# Patient Record
Sex: Female | Born: 1975 | Race: White | Hispanic: No | Marital: Single | Smoking: Current every day smoker
Health system: Southern US, Community
[De-identification: ages and names within clinical notes are randomized; demographics above are authoritative.]

## PROBLEM LIST (undated history)

## (undated) DIAGNOSIS — R011 Cardiac murmur, unspecified: Secondary | ICD-10-CM

## (undated) DIAGNOSIS — F411 Generalized anxiety disorder: Secondary | ICD-10-CM

## (undated) DIAGNOSIS — F32A Depression, unspecified: Secondary | ICD-10-CM

## (undated) DIAGNOSIS — R911 Solitary pulmonary nodule: Secondary | ICD-10-CM

## (undated) DIAGNOSIS — Z720 Tobacco use: Secondary | ICD-10-CM

## (undated) DIAGNOSIS — I10 Essential (primary) hypertension: Secondary | ICD-10-CM

## (undated) DIAGNOSIS — M797 Fibromyalgia: Secondary | ICD-10-CM

## (undated) DIAGNOSIS — T4145XA Adverse effect of unspecified anesthetic, initial encounter: Secondary | ICD-10-CM

## (undated) DIAGNOSIS — I471 Supraventricular tachycardia, unspecified: Secondary | ICD-10-CM

## (undated) DIAGNOSIS — R161 Splenomegaly, not elsewhere classified: Secondary | ICD-10-CM

## (undated) DIAGNOSIS — K760 Fatty (change of) liver, not elsewhere classified: Secondary | ICD-10-CM

## (undated) DIAGNOSIS — F329 Major depressive disorder, single episode, unspecified: Secondary | ICD-10-CM

## (undated) DIAGNOSIS — R002 Palpitations: Secondary | ICD-10-CM

## (undated) DIAGNOSIS — I7300 Raynaud's syndrome without gangrene: Secondary | ICD-10-CM

## (undated) DIAGNOSIS — J189 Pneumonia, unspecified organism: Secondary | ICD-10-CM

## (undated) DIAGNOSIS — T8859XA Other complications of anesthesia, initial encounter: Secondary | ICD-10-CM

## (undated) DIAGNOSIS — M67919 Unspecified disorder of synovium and tendon, unspecified shoulder: Secondary | ICD-10-CM

## (undated) DIAGNOSIS — M8700 Idiopathic aseptic necrosis of unspecified bone: Secondary | ICD-10-CM

## (undated) DIAGNOSIS — K219 Gastro-esophageal reflux disease without esophagitis: Secondary | ICD-10-CM

## (undated) HISTORY — DX: Essential (primary) hypertension: I10

## (undated) HISTORY — PX: TONSILLECTOMY: SUR1361

## (undated) HISTORY — DX: Palpitations: R00.2

## (undated) HISTORY — DX: Tobacco use: Z72.0

## (undated) HISTORY — DX: Generalized anxiety disorder: F41.1

## (undated) HISTORY — DX: Depression, unspecified: F32.A

## (undated) HISTORY — DX: Major depressive disorder, single episode, unspecified: F32.9

## (undated) HISTORY — DX: Supraventricular tachycardia: I47.1

## (undated) HISTORY — DX: Cardiac murmur, unspecified: R01.1

## (undated) HISTORY — DX: Supraventricular tachycardia, unspecified: I47.10

---

## 2014-12-19 HISTORY — PX: SACRO-ILIAC PINNING: SHX5050

## 2016-04-27 ENCOUNTER — Emergency Department (HOSPITAL_COMMUNITY)
Admission: EM | Admit: 2016-04-27 | Discharge: 2016-04-28 | Disposition: A | Discharge status: Home / Self Care | Payer: Self-pay | Attending: Emergency Medicine | Admitting: Emergency Medicine

## 2016-04-27 ENCOUNTER — Other Ambulatory Visit: Payer: Self-pay

## 2016-04-27 ENCOUNTER — Emergency Department (HOSPITAL_COMMUNITY): Payer: Self-pay

## 2016-04-27 ENCOUNTER — Encounter (HOSPITAL_COMMUNITY): Payer: Self-pay | Admitting: *Deleted

## 2016-04-27 DIAGNOSIS — R55 Syncope and collapse: Secondary | ICD-10-CM | POA: Insufficient documentation

## 2016-04-27 DIAGNOSIS — R0602 Shortness of breath: Secondary | ICD-10-CM | POA: Insufficient documentation

## 2016-04-27 DIAGNOSIS — R110 Nausea: Secondary | ICD-10-CM | POA: Insufficient documentation

## 2016-04-27 DIAGNOSIS — R000 Tachycardia, unspecified: Secondary | ICD-10-CM | POA: Insufficient documentation

## 2016-04-27 DIAGNOSIS — R079 Chest pain, unspecified: Secondary | ICD-10-CM | POA: Insufficient documentation

## 2016-04-27 DIAGNOSIS — R945 Abnormal results of liver function studies: Secondary | ICD-10-CM | POA: Insufficient documentation

## 2016-04-27 DIAGNOSIS — R7989 Other specified abnormal findings of blood chemistry: Secondary | ICD-10-CM

## 2016-04-27 HISTORY — DX: Gastro-esophageal reflux disease without esophagitis: K21.9

## 2016-04-27 LAB — BASIC METABOLIC PANEL
ANION GAP: 16 — AB (ref 5–15)
BUN: <5 mg/dL — ABNORMAL LOW (ref 6–20)
CALCIUM: 8.9 mg/dL (ref 8.9–10.3)
CO2: 21 mmol/L — ABNORMAL LOW (ref 22–32)
Chloride: 98 mmol/L — ABNORMAL LOW (ref 101–111)
Creatinine, Ser: 0.78 mg/dL (ref 0.44–1.00)
GLUCOSE: 96 mg/dL (ref 65–99)
POTASSIUM: 3.7 mmol/L (ref 3.5–5.1)
Sodium: 135 mmol/L (ref 135–145)

## 2016-04-27 LAB — HEPATIC FUNCTION PANEL
ALBUMIN: 4 g/dL (ref 3.5–5.0)
ALT: 89 U/L — ABNORMAL HIGH (ref 14–54)
AST: 76 U/L — AB (ref 15–41)
Alkaline Phosphatase: 73 U/L (ref 38–126)
Bilirubin, Direct: 0.1 mg/dL (ref 0.1–0.5)
Indirect Bilirubin: 0.5 mg/dL (ref 0.3–0.9)
TOTAL PROTEIN: 7.5 g/dL (ref 6.5–8.1)
Total Bilirubin: 0.6 mg/dL (ref 0.3–1.2)

## 2016-04-27 LAB — CBC WITH DIFFERENTIAL/PLATELET
BASOS ABS: 0.1 10*3/uL (ref 0.0–0.1)
BASOS PCT: 1 %
EOS ABS: 0.1 10*3/uL (ref 0.0–0.7)
EOS PCT: 1 %
HCT: 43.2 % (ref 36.0–46.0)
HEMOGLOBIN: 14.4 g/dL (ref 12.0–15.0)
LYMPHS ABS: 1.9 10*3/uL (ref 0.7–4.0)
Lymphocytes Relative: 12 %
MCH: 30.3 pg (ref 26.0–34.0)
MCHC: 33.3 g/dL (ref 30.0–36.0)
MCV: 90.8 fL (ref 78.0–100.0)
Monocytes Absolute: 0.9 10*3/uL (ref 0.1–1.0)
Monocytes Relative: 6 %
NEUTROS PCT: 80 %
Neutro Abs: 12.2 10*3/uL — ABNORMAL HIGH (ref 1.7–7.7)
PLATELETS: 394 10*3/uL (ref 150–400)
RBC: 4.76 MIL/uL (ref 3.87–5.11)
RDW: 12.5 % (ref 11.5–15.5)
WBC: 15.2 10*3/uL — AB (ref 4.0–10.5)

## 2016-04-27 LAB — D-DIMER, QUANTITATIVE (NOT AT ARMC): D DIMER QUANT: 0.93 ug{FEU}/mL — AB (ref 0.00–0.50)

## 2016-04-27 LAB — TROPONIN I

## 2016-04-27 LAB — LIPASE, BLOOD: LIPASE: 21 U/L (ref 11–51)

## 2016-04-27 MED ORDER — ASPIRIN 81 MG PO CHEW
324.0000 mg | CHEWABLE_TABLET | Freq: Once | ORAL | Status: DC
  Filled 2016-04-27 (×2): qty 4

## 2016-04-27 MED ORDER — SODIUM CHLORIDE 0.9 % IV BOLUS (SEPSIS)
1000.0000 mL | Freq: Once | INTRAVENOUS | Status: AC
  Administered 2016-04-27: 1000 mL via INTRAVENOUS

## 2016-04-27 MED ORDER — ONDANSETRON HCL 4 MG/2ML IJ SOLN
4.0000 mg | Freq: Once | INTRAMUSCULAR | Status: AC
Start: 2016-04-27 — End: 2016-04-27
  Administered 2016-04-27: 4 mg via INTRAVENOUS
  Filled 2016-04-27: qty 2

## 2016-04-27 MED ORDER — ONDANSETRON HCL 4 MG PO TABS: 4.0000 mg | ORAL_TABLET | Freq: Three times a day (TID) | ORAL | Status: DC | PRN

## 2016-04-27 MED ORDER — IOPAMIDOL (ISOVUE-370) INJECTION 76%
INTRAVENOUS | Status: AC
  Administered 2016-04-27: 100 mL
  Filled 2016-04-27: qty 100

## 2016-04-27 MED ORDER — SODIUM CHLORIDE 0.9 % IV SOLN
INTRAVENOUS | Status: DC
  Administered 2016-04-27: 20:00:00 via INTRAVENOUS

## 2016-04-27 NOTE — ED Provider Notes (Signed)
CSN: [id number]     Arrival date & time 04/27/16  1719 History   First MD Initiated Contact with Patient 04/27/16 1720     Chief Complaint  Patient presents with  . Chest Pain  . Shortness of Breath     (Consider location/radiation/quality/duration/timing/severity/associated sxs/prior Treatment) Patient is a 40 y.o. female presenting with general illness. The history is provided by the patient.  Illness Location:  Chest pain Severity:  Moderate Onset quality:  Unable to specify Duration:  2 weeks Timing:  Intermittent Progression:  Waxing and waning Chronicity:  New Context:  No pertinent past medical history. 2 weeks of waxing and waning right-sided chest pain. Today got worse. More left-sided, sharp pain, radiates to substernal area. Endorses nausea, no emesis. No diaphoresis. Endorses shortness of breath. Endorses near syncope. No cardiac risk factors, no PE risk factors. Associated symptoms: chest pain, nausea and shortness of breath   Associated symptoms: no abdominal pain, no cough, no diarrhea, no fever, no headaches and no vomiting     No past medical history on file. No past surgical history on file. No family history on file. Social History  Substance Use Topics  . Smoking status: Not on file  . Smokeless tobacco: Not on file  . Alcohol Use: Not on file   OB History    No data available     Review of Systems  Constitutional: Negative for fever and chills.  Respiratory: Positive for shortness of breath. Negative for cough.   Cardiovascular: Positive for chest pain. Negative for palpitations and leg swelling.  Gastrointestinal: Positive for nausea. Negative for vomiting, abdominal pain and diarrhea.  Musculoskeletal: Negative for back pain and neck pain.  Neurological: Positive for light-headedness. Negative for headaches.  All other systems reviewed and are negative.     Allergies  Review of patient's allergies indicates not on file.  Home Medications    Prior to Admission medications   Not on File   There were no vitals taken for this visit. Physical Exam  Constitutional: She is oriented to person, place, and time. She appears well-developed and well-nourished. No distress.  HENT:  Head: Normocephalic and atraumatic.  Eyes: EOM are normal. Pupils are equal, round, and reactive to light.  Neck: Normal range of motion.  Cardiovascular: Normal rate and regular rhythm.   Pulmonary/Chest: No tachypnea. No respiratory distress.  Abdominal: Soft. Normal appearance. There is no tenderness.  Neurological: She is alert and oriented to person, place, and time. GCS eye subscore is 4. GCS verbal subscore is 5. GCS motor subscore is 6.  Skin: Skin is warm and dry.    ED Course  Procedures (including critical care time) Labs Review Labs Reviewed  BASIC METABOLIC PANEL - Abnormal; Notable for the following:    Chloride 98 (*)    CO2 21 (*)    BUN <5 (*)    Anion gap 16 (*)    All other components within normal limits  CBC WITH DIFFERENTIAL/PLATELET - Abnormal; Notable for the following:    WBC 15.2 (*)    Neutro Abs 12.2 (*)    All other components within normal limits  D-DIMER, QUANTITATIVE (NOT AT [facility]) - Abnormal; Notable for the following:    D-Dimer, Quant 0.93 (*)    All other components within normal limits  HEPATIC FUNCTION PANEL - Abnormal; Notable for the following:    AST 76 (*)    ALT 89 (*)    All other components within normal limits  TROPONIN I  TROPONIN I  LIPASE, BLOOD  HEPATITIS PANEL, ACUTE  POC URINE PREG, ED    Imaging Review Dg Chest 2 View  04/27/2016  CLINICAL DATA:  Left-sided pain for 2 weeks. Loss of consciousness today. Initial encounter. EXAM: CHEST  2 VIEW COMPARISON:  None. FINDINGS: The lungs are clear. Heart size is normal. No pneumothorax or pleural effusion. No bony abnormality. IMPRESSION: Normal chest. Electronically Signed   By: Inge Rise M.D.   On: 04/27/2016 18:19   Ct Angio Chest  Pe W/cm &/or Wo Cm  04/27/2016  CLINICAL DATA:  Chest pain for several days. Shortness of breath and chest palpitations. Initial encounter. EXAM: CT ANGIOGRAPHY CHEST WITH CONTRAST TECHNIQUE: Multidetector CT imaging of the chest was performed using the standard protocol during bolus administration of intravenous contrast. Multiplanar CT image reconstructions and MIPs were obtained to evaluate the vascular anatomy. CONTRAST:  100 ml Isovue 370. COMPARISON:  PA and lateral chest earlier today. FINDINGS: No pulmonary embolus is identified. No pleural or pericardial effusion. No axillary, hilar or mediastinal lymphadenopathy. Heart size is normal. The lungs are clear. Incidentally imaged upper abdomen shows fatty infiltration of the liver. No focal bony abnormality. Review of the MIP images confirms the above findings. IMPRESSION: Negative for pulmonary embolus or acute cardiopulmonary disease. Fatty infiltration of the liver. Electronically Signed   By: Inge Rise M.D.   On: 04/27/2016 20:04   I have personally reviewed and evaluated these images and lab results as part of my medical decision-making.   EKG Interpretation   Date/Time:  Wednesday Apr 27 2016 17:19:16 EDT Ventricular Rate:  112 PR Interval:  136 QRS Duration: 78 QT Interval:  344 QTC Calculation: 469 R Axis:   81 Text Interpretation:  Sinus tachycardia Otherwise normal ECG No old  tracing to compare Confirmed by [facility]  MD, DAVID ([id number]) on 04/27/2016  5:22:23 PM      MDM   Final diagnoses:  Chest pain  Elevated LFTs    40 year old female with no pertinent past medical history presenting with chest pain.  Presently the last 2 weeks. Worse today. Came to the emergency department due to near syncopal episode and worsening pain. Pleuritic in nature. Reports pain is improved by the time she got here. Denies any fevers and chills. Denies localizing infectious symptoms.  Patient has sinus tachycardia and mild tachypnea. No  hypertension. No hypoxia. Afebrile. Cardiopulmonary exam normal. Pain is reproducible on physical exam.  EKG without interval abnormalities or ischemic changes. Giving the patient a full dose aspirin. Getting delta troponin. Patient is a wells score 1.5. Getting a d-dimer. Getting a chest x-ray.  7:22 PM D-dimer elevated. Getting a CT PE study. Leukocytosis. Mild anion gap. Not hyperglycemic. First troponin negative.  7:53 PM Called into the patient's room as she was vomiting. She did just recently get her IV contrast for her PE study. Patient reports that she's had IV contrast in the past and has not had an issue with her. No rashes identified. No wheezing on exam. We'll give her Zofran here. We'll continue to monitor for potential allergic reaction. We will add on lipase and hepatic function panel.  8:10 PM CT PE study without evidence of pulmonary embolus him. No evidence of pneumothorax or pneumonia. Still waiting on a second troponin and lipase and hepatic function panel. We'll continue to give IV fluids and antiemetics as needed.  9:45 PM Lipase without evidence of pancreatitis. Patient has mild elevation in her LFTs. She reports that she does  have a history of being exposed to somebody with hepatitis C in the past. Denies any IV or illicit drug use. Denies heavy Tylenol use. Denies heavy alcohol use. We'll send off a hepatitis panel.  11:40 PM Second troponin returned. Doubt ACS. Patient had a prolonged stay as her labs were lost multiple times in the lab.  Strict return precautions provided. Provided her information on how to establish care with a primary care doctor. Encouraged her to do this. Gave her prescription for Zofran given her nausea here. Information regarding interactions and side effects of medication were provided.  Patient discharged in stable condition.  Maryan Puls, MD [phone] [id number]  Delora Fuel, MD [phone]

## 2016-04-27 NOTE — ED Notes (Signed)
Pt arrived by [facility] for cp and sob x 2 weeks. Increase in symptoms today including dizziness. Sob increases with exertion. Pain radiates from left chest to right chest. Received nitro x 2 and 324mg  asa pta.

## 2016-04-27 NOTE — ED Notes (Signed)
Patient transported to X-ray 

## 2016-04-28 NOTE — ED Notes (Signed)
Discharge instructions reviewed - voiced understanding 

## 2016-04-29 LAB — HEPATITIS PANEL, ACUTE
HCV Ab: <0.1 s/co ratio (ref 0.0–0.9)
HEP B C IGM: NEGATIVE
Hep A IgM: NEGATIVE
Hepatitis B Surface Ag: NEGATIVE

## 2016-11-18 ENCOUNTER — Emergency Department (HOSPITAL_COMMUNITY): Payer: Self-pay

## 2016-11-18 ENCOUNTER — Emergency Department (HOSPITAL_COMMUNITY)
Admission: EM | Admit: 2016-11-18 | Discharge: 2016-11-18 | Disposition: A | Discharge status: Home / Self Care | Payer: Self-pay | Attending: Emergency Medicine | Admitting: Emergency Medicine

## 2016-11-18 DIAGNOSIS — E876 Hypokalemia: Secondary | ICD-10-CM | POA: Insufficient documentation

## 2016-11-18 DIAGNOSIS — Z79899 Other long term (current) drug therapy: Secondary | ICD-10-CM | POA: Insufficient documentation

## 2016-11-18 DIAGNOSIS — R079 Chest pain, unspecified: Secondary | ICD-10-CM | POA: Insufficient documentation

## 2016-11-18 LAB — BASIC METABOLIC PANEL
ANION GAP: 11 (ref 5–15)
BUN: 9 mg/dL (ref 6–20)
CHLORIDE: 101 mmol/L (ref 101–111)
CO2: 25 mmol/L (ref 22–32)
Calcium: 9.1 mg/dL (ref 8.9–10.3)
Creatinine, Ser: 0.62 mg/dL (ref 0.44–1.00)
GFR calc Af Amer: >60 mL/min (ref >60)
GLUCOSE: 106 mg/dL — AB (ref 65–99)
POTASSIUM: 3 mmol/L — AB (ref 3.5–5.1)
Sodium: 137 mmol/L (ref 135–145)

## 2016-11-18 LAB — CBC
HEMATOCRIT: 40.9 % (ref 36.0–46.0)
HEMOGLOBIN: 14.1 g/dL (ref 12.0–15.0)
MCH: 32 pg (ref 26.0–34.0)
MCHC: 34.5 g/dL (ref 30.0–36.0)
MCV: 93 fL (ref 78.0–100.0)
Platelets: 333 10*3/uL (ref 150–400)
RBC: 4.4 MIL/uL (ref 3.87–5.11)
RDW: 12.4 % (ref 11.5–15.5)
WBC: 10.2 10*3/uL (ref 4.0–10.5)

## 2016-11-18 LAB — I-STAT TROPONIN, ED: Troponin i, poc: 0 ng/mL (ref 0.00–0.08)

## 2016-11-18 LAB — TSH: TSH: 5.319 u[IU]/mL — AB (ref 0.350–4.500)

## 2016-11-18 MED ORDER — POTASSIUM CHLORIDE CRYS ER 20 MEQ PO TBCR
40.0000 meq | EXTENDED_RELEASE_TABLET | Freq: Once | ORAL | Status: AC
  Administered 2016-11-18: 40 meq via ORAL
  Filled 2016-11-18: qty 2

## 2016-11-18 NOTE — Discharge Instructions (Signed)
Your episodes may be due to anxiety or may be due to episodic increased heart rate. There were no significant abnormalities on the lab work other than low potassium. A supplement for potassium was given here in the ED.  Follow-up with cardiology for further assessment of the increased heart rate. It is recommended that you follow-up with a primary care provider on the low potassium.  Return to the ED should any symptoms worsen.

## 2016-11-18 NOTE — ED Provider Notes (Signed)
[facility] DEPT Provider Note   CSN: [id number] Arrival date & time: 11/18/16  0011 By signing my name below, I, Dyke Brackett, attest that this documentation has been prepared under the direction and in the presence of non-physician practitioner, Arlean Hopping, PA-C.  Electronically Signed: Dyke Brackett, Scribe. 11/18/2016. 2:35 AM.   History   Chief Complaint Chief Complaint  Patient presents with  . Chest Pain   HPI Deborah Goodman is a 40 y.o. female with a hx of acid reflux who presents to the Emergency Department complaining of Intermittent episodes of increased heart rate over the past several months, increasing in frequency over the past week. Patient states that she will "randomly" feel her heart start to race, her chest gets tight, she feels "shaky," feels anxious, and then the episode resolves after she "calms down." Episodes occur at different times of day and during different activities, lasting for about 5 minutes at a time. Also endorses lightheadedness and tremors. Pt is a current 1/2 pack/day smoker. She denies syncope, dizziness, nausea/vomiting, or any other complaints.    The history is provided by the patient. No language interpreter was used.   Past Medical History:  Diagnosis Date  . Acid reflux     There are no active problems to display for this patient.   No past surgical history on file.  OB History    No data available      Home Medications    Prior to Admission medications   Medication Sig Start Date End Date Taking? Authorizing Provider  B Complex-C (B-COMPLEX WITH VITAMIN C) tablet Take 1 tablet by mouth daily.   Yes Historical Provider, MD  cholecalciferol (VITAMIN D) 1000 units tablet Take 1,000 Units by mouth daily.   Yes Historical Provider, MD  milk thistle 175 MG tablet Take 175 mg by mouth daily.   Yes Historical Provider, MD  Multiple Vitamin (MULTIVITAMIN WITH MINERALS) TABS tablet Take 1 tablet by mouth daily.   Yes Historical  Provider, MD  Multiple Vitamins-Minerals (AIRBORNE GUMMIES) CHEW Chew 1 each by mouth daily.   Yes Historical Provider, MD  Omega-3 Fatty Acids (FISH OIL PO) Take 1 capsule by mouth daily.   Yes Historical Provider, MD  ondansetron (ZOFRAN) 4 MG tablet Take 1 tablet (4 mg total) by mouth every 8 (eight) hours as needed for nausea or vomiting. Patient not taking: Reported on 11/18/2016 04/27/16   Maryan Puls, MD    Family History No family history on file.  Social History Social History  Substance Use Topics  . Smoking status: Not on file  . Smokeless tobacco: Not on file  . Alcohol use No    Allergies   Other and Sulfa antibiotics  Review of Systems Review of Systems  Cardiovascular: Positive for chest pain and palpitations.  Gastrointestinal: Negative for nausea and vomiting.  Neurological: Positive for tremors and light-headedness. Negative for syncope.  All other systems reviewed and are negative.  Physical Exam Updated Vital Signs BP 133/98 (BP Location: Left Arm)   Pulse 89   Temp 98.7 F (37.1 C) (Oral)   Resp 11   Ht 5\' 6"  (1.676 m)   Wt 190 lb (86.2 kg)   LMP 11/17/2016   SpO2 99%   BMI 30.67 kg/m   Physical Exam  Constitutional: She appears well-developed and well-nourished. No distress.  HENT:  Head: Normocephalic and atraumatic.  Eyes: Conjunctivae are normal.  Neck: Neck supple.  Cardiovascular: Normal rate, regular rhythm, normal heart sounds and intact distal  pulses.   Pulmonary/Chest: Effort normal and breath sounds normal. No respiratory distress.  Abdominal: Soft. There is no tenderness. There is no guarding.  Musculoskeletal: She exhibits no edema.  Lymphadenopathy:    She has no cervical adenopathy.  Neurological: She is alert.  Skin: Skin is warm and dry. She is not diaphoretic.  Psychiatric: She has a normal mood and affect. Her behavior is normal.  Nursing note and vitals reviewed.  ED Treatments / Results  DIAGNOSTIC  STUDIES:  Oxygen Saturation is 99% on RA, normal by my interpretation.    COORDINATION OF CARE:  2:33 AM Discussed treatment plan with pt at bedside and pt agreed to plan.  Labs (all labs ordered are listed, but only abnormal results are displayed) Labs Reviewed  BASIC METABOLIC PANEL - Abnormal; Notable for the following:       Result Value   Potassium 3.0 (*)    Glucose, Bld 106 (*)    All other components within normal limits  TSH - Abnormal; Notable for the following:    TSH 5.319 (*)    All other components within normal limits  CBC  I-STAT TROPOININ, ED    EKG  EKG Interpretation  Date/Time:  Friday November 18 2016 00:23:21 EST Ventricular Rate:  110 PR Interval:    QRS Duration: 87 QT Interval:  342 QTC Calculation: 463 R Axis:   68 Text Interpretation:  Sinus tachycardia Otherwise normal ECG Confirmed by DELO  MD, DOUGLAS ([id number]) on 11/18/2016 12:35:24 AM       Radiology Dg Chest 2 View  Result Date: 11/18/2016 CLINICAL DATA:  Acute onset of upper mid chest pain and shakiness. Near syncope. Cough. Initial encounter. EXAM: CHEST  2 VIEW COMPARISON:  Chest radiograph and CTA of the chest performed 04/27/2016 FINDINGS: The lungs are well-aerated and clear. There is no evidence of focal opacification, pleural effusion or pneumothorax. The heart is normal in size; the mediastinal contour is within normal limits. No acute osseous abnormalities are seen. IMPRESSION: No acute cardiopulmonary process seen. Electronically Signed   By: Garald Balding M.D.   On: 11/18/2016 00:59    Procedures Procedures (including critical care time)  Medications Ordered in ED Medications  potassium chloride SA (K-DUR,KLOR-CON) CR tablet 40 mEq (40 mEq Oral Given 11/18/16 0235)     Initial Impression / Assessment and Plan / ED Course  I have reviewed the triage vital signs and the nursing notes.  Pertinent labs & imaging results that were available during my care of the patient were  reviewed by me and considered in my medical decision making (see chart for details).  Clinical Course     Patient presents with episodes that may be consistent with anxiety and/or possible runs of tachycardia. Patient was observed here in the ED and did not have a recurrence of her symptoms. Patient ambulated without difficulty or assistance. Cardiology follow-up. Strict return precautions discussed. Patient voiced understanding of all instructions, agrees to the plan, and is comfortable with discharge   Findings and plan of care discussed with Veryl Speak, MD.   Vitals:   11/18/16 0018 11/18/16 0124 11/18/16 0327  BP:  133/98 122/73  Pulse: 104 89 99  Resp: 18 11 16   Temp: 98.1 F (36.7 C) 98.7 F (37.1 C) 98.7 F (37.1 C)  TempSrc: Oral Oral   SpO2: 100% 99% 98%  Weight: 86.2 kg    Height: 5\' 6"  (1.676 m)       Final Clinical Impressions(s) / ED Diagnoses  Final diagnoses:  Chest pain, unspecified type  Hypokalemia    New Prescriptions Discharge Medication List as of 11/18/2016  3:18 AM      I personally performed the services described in this documentation, which was scribed in my presence. The recorded information has been reviewed and is accurate.   Lorayne Bender, PA-C 11/18/16 [id number]    Veryl Speak, MD 11/21/16 [phone]

## 2016-11-18 NOTE — ED Notes (Signed)
Pt states that she was having palpation, and pressure  and that her heart was skipping a beat. Pt began feeling anxious, and became SOB with episodes. Pt does report she had some dizziness with the chest discomfort. Pt does report that she was recently sick and had been coughing

## 2016-11-18 NOTE — ED Notes (Signed)
Pt c/o chest pain since going to work at 2pm yesterday. Pt had multiple episodes of dizzyness, lightheadness, no nausea or vomiting.

## 2016-12-27 ENCOUNTER — Emergency Department (HOSPITAL_COMMUNITY): Payer: 59

## 2016-12-27 ENCOUNTER — Emergency Department (HOSPITAL_COMMUNITY)
Admission: EM | Admit: 2016-12-27 | Discharge: 2016-12-27 | Disposition: A | Discharge status: Home / Self Care | Payer: 59 | Attending: Emergency Medicine | Admitting: Emergency Medicine

## 2016-12-27 ENCOUNTER — Encounter (HOSPITAL_COMMUNITY): Payer: Self-pay | Admitting: Emergency Medicine

## 2016-12-27 DIAGNOSIS — Z87891 Personal history of nicotine dependence: Secondary | ICD-10-CM | POA: Diagnosis not present

## 2016-12-27 DIAGNOSIS — R7989 Other specified abnormal findings of blood chemistry: Secondary | ICD-10-CM

## 2016-12-27 DIAGNOSIS — R002 Palpitations: Secondary | ICD-10-CM | POA: Diagnosis not present

## 2016-12-27 DIAGNOSIS — R946 Abnormal results of thyroid function studies: Secondary | ICD-10-CM | POA: Insufficient documentation

## 2016-12-27 LAB — I-STAT TROPONIN, ED
TROPONIN I, POC: 0 ng/mL (ref 0.00–0.08)
Troponin i, poc: 0 ng/mL (ref 0.00–0.08)

## 2016-12-27 LAB — BASIC METABOLIC PANEL
Anion gap: 11 (ref 5–15)
BUN: 5 mg/dL — AB (ref 6–20)
CHLORIDE: 103 mmol/L (ref 101–111)
CO2: 25 mmol/L (ref 22–32)
Calcium: 9.4 mg/dL (ref 8.9–10.3)
Creatinine, Ser: 0.76 mg/dL (ref 0.44–1.00)
GFR calc Af Amer: >60 mL/min (ref >60)
GFR calc non Af Amer: >60 mL/min (ref >60)
GLUCOSE: 98 mg/dL (ref 65–99)
POTASSIUM: 3.5 mmol/L (ref 3.5–5.1)
Sodium: 139 mmol/L (ref 135–145)

## 2016-12-27 LAB — CBC WITH DIFFERENTIAL/PLATELET
BASOS PCT: 0 %
Basophils Absolute: 0 10*3/uL (ref 0.0–0.1)
EOS PCT: 3 %
Eosinophils Absolute: 0.3 10*3/uL (ref 0.0–0.7)
HEMATOCRIT: 45.7 % (ref 36.0–46.0)
Hemoglobin: 15.9 g/dL — ABNORMAL HIGH (ref 12.0–15.0)
Lymphocytes Relative: 27 %
Lymphs Abs: 3.5 10*3/uL (ref 0.7–4.0)
MCH: 32.4 pg (ref 26.0–34.0)
MCHC: 34.8 g/dL (ref 30.0–36.0)
MCV: 93.3 fL (ref 78.0–100.0)
MONO ABS: 0.8 10*3/uL (ref 0.1–1.0)
MONOS PCT: 6 %
NEUTROS ABS: 8.3 10*3/uL — AB (ref 1.7–7.7)
Neutrophils Relative %: 64 %
Platelets: 340 10*3/uL (ref 150–400)
RBC: 4.9 MIL/uL (ref 3.87–5.11)
RDW: 12.6 % (ref 11.5–15.5)
WBC: 12.9 10*3/uL — ABNORMAL HIGH (ref 4.0–10.5)

## 2016-12-27 LAB — I-STAT BETA HCG BLOOD, ED (MC, WL, AP ONLY): I-stat hCG, quantitative: <5 m[IU]/mL (ref <5)

## 2016-12-27 LAB — TSH: TSH: 4.781 u[IU]/mL — ABNORMAL HIGH (ref 0.350–4.500)

## 2016-12-27 MED ORDER — SODIUM CHLORIDE 0.9 % IV BOLUS (SEPSIS)
1000.0000 mL | Freq: Once | INTRAVENOUS | Status: AC
  Administered 2016-12-27: 1000 mL via INTRAVENOUS

## 2016-12-27 MED ORDER — HYDROXYZINE HCL 25 MG PO TABS: 50.0000 mg | ORAL_TABLET | Freq: Four times a day (QID) | ORAL | 0 refills | Status: DC

## 2016-12-27 MED ORDER — LORAZEPAM 2 MG/ML IJ SOLN: 1.0000 mg | Freq: Once | INTRAMUSCULAR | Status: DC

## 2016-12-27 MED ORDER — LORAZEPAM 2 MG/ML IJ SOLN
1.0000 mg | Freq: Once | INTRAMUSCULAR | Status: AC
  Administered 2016-12-27: 1 mg via INTRAVENOUS
  Filled 2016-12-27: qty 1

## 2016-12-27 MED ORDER — HYDROXYZINE HCL 25 MG PO TABS
50.0000 mg | ORAL_TABLET | Freq: Once | ORAL | Status: AC
  Administered 2016-12-27: 50 mg via ORAL
  Filled 2016-12-27: qty 2

## 2016-12-27 NOTE — ED Notes (Signed)
Pt stated she was about to pass out notified Anna(RN)

## 2016-12-27 NOTE — Discharge Instructions (Signed)
Do not hesitate to return to the emergency room for any new, worsening or concerning symptoms. ° °Please obtain primary care using resource guide below. Let them know that you were seen in the emergency room and that they will need to obtain records for further outpatient management. ° ° °

## 2016-12-27 NOTE — ED Provider Notes (Signed)
[facility] DEPT Provider Note   CSN: [id number] Arrival date & time: 12/27/16  0920     History   Chief Complaint Chief Complaint  Patient presents with  . Chest Pain  . Palpitations    HPI   There were no vitals taken for this visit.  Deborah Goodman is a 41 y.o. female complaining of acute onset of severe palpitations the sensation that her heart is racing,  and chest pain laying presyncopal this morning when she was at work in the [place]. She's had multiple similar prior episodes. Associated with tremors and body shaking. She has not followed with cardiology. She denies syncope during this episode, cocaine, methamphetamine use. She does have a history of depression no formal history of anxiety or panic disorder. She did drink caffeine this morning which is atypical for her. She doesn't follow regularly with primary care because she recently got insurance. She doesn't have a history of DVT/PE no reported recent immobilizations, no calf pain or leg swelling she does have some swelling to the left foot which she's had intermittently over the course of the last year. She doesn't take any estrogen, no cancer history. She does smoke cigarettes.  Past Medical History:  Diagnosis Date  . Acid reflux     There are no active problems to display for this patient.   History reviewed. No pertinent surgical history.  OB History    No data available       Home Medications    Prior to Admission medications   Medication Sig Start Date End Date Taking? Authorizing Provider  B Complex-C (B-COMPLEX WITH VITAMIN C) tablet Take 1 tablet by mouth daily.   Yes Historical Provider, MD  cholecalciferol (VITAMIN D) 1000 units tablet Take 1,000 Units by mouth daily.   Yes Historical Provider, MD  milk thistle 175 MG tablet Take 175 mg by mouth daily.   Yes Historical Provider, MD  Multiple Vitamin (MULTIVITAMIN WITH MINERALS) TABS tablet Take 1 tablet by mouth daily.   Yes Historical Provider, MD   Omega-3 Fatty Acids (FISH OIL PO) Take 1 capsule by mouth daily.   Yes Historical Provider, MD  hydrOXYzine (ATARAX/VISTARIL) 25 MG tablet Take 2 tablets (50 mg total) by mouth every 6 (six) hours. 12/27/16   Decari Duggar, PA-C  ondansetron (ZOFRAN) 4 MG tablet Take 1 tablet (4 mg total) by mouth every 8 (eight) hours as needed for nausea or vomiting. Patient not taking: Reported on 12/27/2016 04/27/16   Maryan Puls, MD    Family History No family history on file.  Social History Social History  Substance Use Topics  . Smoking status: Former Smoker    Types: Cigarettes  . Smokeless tobacco: Never Used  . Alcohol use No     Allergies   Fish allergy; Shellfish allergy; and Sulfa antibiotics   Review of Systems Review of Systems  10 systems reviewed and found to be negative, except as noted in the HPI.   Physical Exam Updated Vital Signs BP 141/88   Pulse 80   Temp 98.5 F (36.9 C) (Oral)   Resp 14   LMP  (LMP Unknown)   SpO2 93%   Physical Exam  Constitutional: She is oriented to person, place, and time. She appears well-developed and well-nourished. No distress.  HENT:  Head: Normocephalic.  Mouth/Throat: Oropharynx is clear and moist.  Eyes: Conjunctivae are normal.  Neck: Normal range of motion. No JVD present. No tracheal deviation present.  Cardiovascular: Normal rate, regular rhythm and  intact distal pulses.   Radial pulse equal bilaterally  Rate variable going from 90s to 120s  Pulmonary/Chest: Effort normal and breath sounds normal. No stridor. No respiratory distress. She has no wheezes. She has no rales. She exhibits no tenderness.  Abdominal: Soft. She exhibits no distension and no mass. There is no tenderness. There is no rebound and no guarding.  Musculoskeletal: Normal range of motion. She exhibits no edema or tenderness.  No calf asymmetry, superficial collaterals, palpable cords, edema, Homans sign negative bilaterally.    Neurological: She is  alert and oriented to person, place, and time.  Skin: Skin is warm. She is not diaphoretic.  Psychiatric:  Very mild agitation, difficult to focus, perseverating on her symptoms and needs to be redirected to answer questions  Nursing note and vitals reviewed.    ED Treatments / Results  Labs (all labs ordered are listed, but only abnormal results are displayed) Labs Reviewed  CBC WITH DIFFERENTIAL/PLATELET - Abnormal; Notable for the following:       Result Value   WBC 12.9 (*)    Hemoglobin 15.9 (*)    Neutro Abs 8.3 (*)    All other components within normal limits  BASIC METABOLIC PANEL - Abnormal; Notable for the following:    BUN 5 (*)    All other components within normal limits  TSH - Abnormal; Notable for the following:    TSH 4.781 (*)    All other components within normal limits  I-STAT BETA HCG BLOOD, ED ([facility], [facility], [facility] ONLY)  I-STAT TROPOININ, ED  I-STAT TROPOININ, ED    EKG  EKG Interpretation  Date/Time:  Tuesday December 27 2016 09:29:43 EST Ventricular Rate:  140 PR Interval:  130 QRS Duration: 74 QT Interval:  284 QTC Calculation: 433 R Axis:   101 Text Interpretation:  Sinus tachycardia Rightward axis Borderline ECG Other than RAD with significant tachycardia, no signifcant changes  Confirmed by LIU MD, DANA [phone]) on 12/27/2016 9:33:49 AM       Radiology Dg Chest Port 1 View  Result Date: 12/27/2016 CLINICAL DATA:  Chest pain. EXAM: PORTABLE CHEST 1 VIEW COMPARISON:  Chest x-ray 11/18/2016 .  CT 04/27/2016 . FINDINGS: Mediastinum and hilar structures are normal. Lungs are clear. Heart size normal. No pleural effusion or pneumothorax. Degenerative changes thoracic spine. IMPRESSION: No acute cardiopulmonary disease. Electronically Signed   By: Marcello Moores  Register   On: 12/27/2016 11:11    Procedures Procedures (including critical care time)  Medications Ordered in ED Medications  sodium chloride 0.9 % bolus 1,000 mL (0 mLs Intravenous Stopped 12/27/16 1247)   hydrOXYzine (ATARAX/VISTARIL) tablet 50 mg (50 mg Oral Given 12/27/16 1123)  LORazepam (ATIVAN) injection 1 mg (1 mg Intravenous Given 12/27/16 1214)     Initial Impression / Assessment and Plan / ED Course  I have reviewed the triage vital signs and the nursing notes.  Pertinent labs & imaging results that were available during my care of the patient were reviewed by me and considered in my medical decision making (see chart for details).  Clinical Course     Vitals:   12/27/16 1039 12/27/16 1100 12/27/16 1230 12/27/16 1245  BP: 128/89 122/85 143/90 141/88  Pulse: 108 94 87 80  Resp: 20 14    Temp: 98.5 F (36.9 C)     TempSrc: Oral     SpO2: 96% 95% 95% 93%    Medications  sodium chloride 0.9 % bolus 1,000 mL (0 mLs Intravenous Stopped 12/27/16 1247)  hydrOXYzine (ATARAX/VISTARIL) tablet 50 mg (50 mg Oral Given 12/27/16 1123)  LORazepam (ATIVAN) injection 1 mg (1 mg Intravenous Given 12/27/16 1214)    GISSELE MAKRIS is 41 y.o. female presenting with Chest pain and sensation that her heart is racing, she is tachycardic, initially in the 140s however this spontaneously eased to I doubt this is SVT. Patient does appear mildly agitated and difficult to focus, there may be anxiety panic element here. Will check a TSH, she's had multiple similar episodes in the past. She stated that this episode is no different from prior, she's had negative CTA in the past I don't think she would benefit for further evaluation for PE. EKG with no ischemic changes. Troponin negative. Patient is low risk by heart score.  11:55: Pt Reports her chest pain has increased and she is also feeling Shaky. Will repeat evaluate EKG, will check a second troponin. I've encouraged her to try the Ativan to see if that doesn't alleviate her symptoms.  Repeat EKG with no acute findings, will check a second troponin.  Second troponin negative. Patient feels better after Ativan. She will call following right home and will  closely follow with cardiology.  Evaluation does not show pathology that would require ongoing emergent intervention or inpatient treatment. Pt is hemodynamically stable and mentating appropriately. Discussed findings and plan with patient/guardian, who agrees with care plan. All questions answered. Return precautions discussed and outpatient follow up given.      Final Clinical Impressions(s) / ED Diagnoses   Final diagnoses:  Palpitations  Elevated TSH    New Prescriptions New Prescriptions   HYDROXYZINE (ATARAX/VISTARIL) 25 MG TABLET    Take 2 tablets (50 mg total) by mouth every 6 (six) hours.     Monico Blitz, PA-C 12/27/16 Quitman Liu, MD 12/27/16 [phone]

## 2016-12-27 NOTE — ED Notes (Addendum)
EKG given to Dr. Liu.  

## 2016-12-27 NOTE — ED Triage Notes (Signed)
Pt in with c/o palpitations and central cp since drinking coffee this morning. Pt states this has happened before, had been unable to see a specialist about this until today. Denies sob, dizziness or n/v. EKG shows ST on arrival.

## 2017-01-09 ENCOUNTER — Encounter: Payer: Self-pay | Admitting: Physician Assistant

## 2017-01-09 ENCOUNTER — Ambulatory Visit (INDEPENDENT_AMBULATORY_CARE_PROVIDER_SITE_OTHER): Payer: 59 | Admitting: Physician Assistant

## 2017-01-09 ENCOUNTER — Encounter (INDEPENDENT_AMBULATORY_CARE_PROVIDER_SITE_OTHER): Payer: 59

## 2017-01-09 VITALS — BP 132/86 | HR 85 | Ht 66.0 in | Wt 194.2 lb

## 2017-01-09 DIAGNOSIS — R002 Palpitations: Secondary | ICD-10-CM

## 2017-01-09 DIAGNOSIS — R011 Cardiac murmur, unspecified: Secondary | ICD-10-CM | POA: Diagnosis not present

## 2017-01-09 MED ORDER — METOPROLOL TARTRATE 25 MG PO TABS: 25.0000 mg | ORAL_TABLET | Freq: Two times a day (BID) | ORAL | 3 refills | Status: DC

## 2017-01-09 NOTE — Progress Notes (Signed)
Cardiology Office Note   Date:  01/10/2017   ID:  Deborah Goodman, DOB 1976-11-02, MRN [id number]  PCP:  No PCP Per Patient  Cardiologist:  New, Dr Marchelle Folks, PA-C   Chief Complaint  Patient presents with  . Follow-up    patient reports palpitations, worsening daily. had an episode on friday, felt like her heart was out of rhythm.    History of Present Illness: Deborah Goodman is a 40 y.o. female with a history of GERD, depression (formerly on Prozac), tob use  01/09 ER visit for palpitations, presyncope, chest pain. ST to the 140s seen on telemetry. Rate gradually normalized. Chest pain improved w/ Ativan, IVF, Atarax. Ez neg MI, ECG not acute.  Deborah Goodman presents for cardiology evaluation  Most of the time she is fine. Sometimes she feels her heart is out of rhythm or is beating very fast. 2 recent episodes are described.   She was leaving work the other night and felt her heart pound several times very fast. That only lasted a few seconds and stopped. It scared her and she sat for a few minutes. She felt better and was able to drive home. She did not seek treatment.  On 01/09, she was finishing some paperwork. She had had a large Pumpkin Latte from [employer] that day, unusual for her. She began feeling her heart pounding, coworker said she was pale, she was light-headed and dizzy. She started shaking. Her HR was checked at work and was 160, BP was high as well. She was sent to the ER and her HR was 140, ST. Her HR was described as gradually slowing by the ER PA. Her symptoms gradually improved.   The first episode was last May, her HR was "through the roof" per a nurse friend with her. She was briefly unresponsive according to her friend. EMS was called, HR 112, ST by ER arrival. No cause found. That is the only time she has lost consciousness. According to the patient, her friend said that she started shaking her and calling her name and she came back  around.  The episodes are coming more frequently now, they are happening almost every day. The duration varies, most last < 10 minutes. No menstrual problems, she is regular. She has some LE edema during the day, does not wake with it. Her sister may have been told she has an abnormal heart valve and had palpitations, was put on a benzo, which has helped. [name] wonders if we can help her by prescribing something for depression. She is still feeling the symptoms. She was on Prozac in the past, but states it did not work well. She wonders if we could prescribe Celexa.   Past Medical History:  Diagnosis Date  . Acid reflux   . Depression   . Systolic murmur   . Tobacco use     Past Surgical History:  Procedure Laterality Date  . SACRO-ILIAC PINNING  2016   fusion, in [place]    Current Outpatient Prescriptions  Medication Sig Dispense Refill  . B Complex-C (B-COMPLEX WITH VITAMIN C) tablet Take 1 tablet by mouth daily.    . cholecalciferol (VITAMIN D) 1000 units tablet Take 1,000 Units by mouth daily.    . hydrOXYzine (ATARAX/VISTARIL) 25 MG tablet Take 2 tablets (50 mg total) by mouth every 6 (six) hours. 20 tablet 0  . milk thistle 175 MG tablet Take 175 mg by mouth daily.    Marland Kitchen  Multiple Vitamin (MULTIVITAMIN WITH MINERALS) TABS tablet Take 1 tablet by mouth daily.    . Omega-3 Fatty Acids (FISH OIL PO) Take 1 capsule by mouth daily.     No current facility-administered medications for this visit.     Allergies:   Fish allergy; Shellfish allergy; and Sulfa antibiotics    Social History:  The patient  reports that she has been smoking Cigarettes.  She has a 11.00 pack-year smoking history. She has never used smokeless tobacco. She reports that she does not drink alcohol or use drugs.   Family History:  The patient's family history includes Arrhythmia in her sister.    ROS:  Please see the history of present illness. All other systems are reviewed and negative.     PHYSICAL EXAM: VS:  BP 132/86   Pulse 85   Ht 5\' 6"  (1.676 m)   Wt 194 lb 3.2 oz (88.1 kg)   LMP  (LMP Unknown)   BMI 31.34 kg/m  , BMI Body mass index is 31.34 kg/m. GEN: Well nourished, well developed, female in no acute distress  HEENT: normal for age  Neck: no JVD, no carotid bruit, no masses Cardiac: RRR; soft murmur, no rubs, or gallops Respiratory:  clear to auscultation bilaterally, normal work of breathing GI: soft, nontender, nondistended, + BS MS: no deformity or atrophy; no edema; distal pulses are 2+ in all 4 extremities   Skin: warm and dry, no rash Neuro:  Strength and sensation are intact Psych: euthymic mood, full affect   EKG:  EKG is ordered today. The ekg ordered today demonstrates SR, HR 85, no acute ischemic changes, normal intervals   Recent Labs: 04/27/2016: ALT 89 12/27/2016: BUN 5; Creatinine, Ser 0.76; Hemoglobin 15.9; Platelets 340; Potassium 3.5; Sodium 139; TSH 4.781    Lipid Panel No results found for: CHOL, TRIG, HDL, CHOLHDL, VLDL, LDLCALC, LDLDIRECT   Wt Readings from Last 3 Encounters:  01/09/17 194 lb 3.2 oz (88.1 kg)  11/18/16 190 lb (86.2 kg)     Other studies Reviewed: Additional studies/ records that were reviewed today include: hospital records and testing.  ASSESSMENT AND PLAN: Patient reviewed with and plan approved by Dr. Debara Pickett  1.  Palpitations: So far, the only arrhythmia documented as sinus tach. However, the patient reports her rate of 160 when checked by coworker during a recent episode. She also states that she is getting these episodes on almost a daily basis. We will get an event monitor and f/u on results. Halfway thru the event monitor, she will start Lopressor 25 mg bid.  2. SEM: Pt is concerned about MVP, will ck echo.   3. Depression: Pt states having symptoms. Advised her she needs to get PCP, pt states she will do so. Advised I do not feel comfortable prescribing Celexa.   Current medicines are reviewed at  length with the patient today.  The patient does not have concerns regarding medicines.  The following changes have been made:  Add BB  Labs/ tests ordered today include:   Orders Placed This Encounter  Procedures  . Cardiac event monitor  . EKG 12-Lead  . ECHOCARDIOGRAM COMPLETE     Disposition:   FU with Dr Debara Pickett or myself after event monitor  Signed, Lenoard Aden  01/10/2017 8:19 AM    [facility] Phone: [phone]; Fax: [phone]  This note was written with the assistance of speech recognition software. Please excuse any transcriptional errors.

## 2017-01-09 NOTE — Patient Instructions (Addendum)
Medication Instructions:  START taking metoprolol 25 mg (1 tablet) two times daily.  BEGIN after wearing event monitor for 2 WEEKS.  Labwork: NONE  Testing/Procedures: Your physician has recommended that you wear a 30 day event monitor. Event monitors are medical devices that record the heart's electrical activity. Doctors most often [place] these monitors to diagnose arrhythmias. Arrhythmias are problems with the speed or rhythm of the heartbeat. The monitor is a small, portable device. You can wear one while you do your normal daily activities. This is usually used to diagnose what is causing palpitations/syncope (passing out).   Follow-Up: Follow up with Rosaria Ferries PA on 3/12 at 2 pm at our [facility] office.  Any Other Special Instructions Will Be Listed Below (If Applicable).  Please obtain a primary care physician (PCP).   If you need a refill on your cardiac medications before your next appointment, please call your pharmacy.

## 2017-01-31 ENCOUNTER — Other Ambulatory Visit (HOSPITAL_COMMUNITY): Payer: 59

## 2017-02-01 DIAGNOSIS — R079 Chest pain, unspecified: Secondary | ICD-10-CM | POA: Diagnosis not present

## 2017-02-02 DIAGNOSIS — D72829 Elevated white blood cell count, unspecified: Secondary | ICD-10-CM | POA: Diagnosis not present

## 2017-02-02 DIAGNOSIS — Z720 Tobacco use: Secondary | ICD-10-CM | POA: Diagnosis not present

## 2017-02-02 DIAGNOSIS — R0602 Shortness of breath: Secondary | ICD-10-CM | POA: Diagnosis not present

## 2017-02-02 DIAGNOSIS — R0789 Other chest pain: Secondary | ICD-10-CM | POA: Diagnosis not present

## 2017-02-02 DIAGNOSIS — R112 Nausea with vomiting, unspecified: Secondary | ICD-10-CM | POA: Diagnosis not present

## 2017-02-02 DIAGNOSIS — R197 Diarrhea, unspecified: Secondary | ICD-10-CM | POA: Diagnosis not present

## 2017-02-02 DIAGNOSIS — R791 Abnormal coagulation profile: Secondary | ICD-10-CM | POA: Diagnosis not present

## 2017-02-02 DIAGNOSIS — R002 Palpitations: Secondary | ICD-10-CM | POA: Diagnosis not present

## 2017-02-02 DIAGNOSIS — F17210 Nicotine dependence, cigarettes, uncomplicated: Secondary | ICD-10-CM | POA: Diagnosis not present

## 2017-02-02 DIAGNOSIS — R079 Chest pain, unspecified: Secondary | ICD-10-CM | POA: Diagnosis not present

## 2017-02-02 DIAGNOSIS — R509 Fever, unspecified: Secondary | ICD-10-CM | POA: Diagnosis not present

## 2017-02-02 DIAGNOSIS — I499 Cardiac arrhythmia, unspecified: Secondary | ICD-10-CM | POA: Diagnosis not present

## 2017-02-02 DIAGNOSIS — R911 Solitary pulmonary nodule: Secondary | ICD-10-CM | POA: Diagnosis not present

## 2017-02-07 ENCOUNTER — Encounter: Payer: Self-pay | Admitting: Physician Assistant

## 2017-02-08 ENCOUNTER — Telehealth: Payer: Self-pay | Admitting: Internal Medicine

## 2017-02-08 NOTE — Telephone Encounter (Signed)
New message      Talk to a nurse to report a critical ekg reading. Pt is wearing a monitor.

## 2017-02-08 NOTE — Telephone Encounter (Signed)
Received call from [employer] for critical EKG reading.  As speaking to [place] from [place] patient called him and reported that her manual entry of syncope was a mistake, therefore reading is now deemed "stable".    AJ from [employer] stated to disregard.

## 2017-02-10 ENCOUNTER — Ambulatory Visit (INDEPENDENT_AMBULATORY_CARE_PROVIDER_SITE_OTHER): Payer: 59 | Admitting: Osteopathic Medicine

## 2017-02-10 ENCOUNTER — Encounter: Payer: Self-pay | Admitting: Osteopathic Medicine

## 2017-02-10 VITALS — BP 149/97 | HR 79 | Ht 66.0 in | Wt 186.0 lb

## 2017-02-10 DIAGNOSIS — R002 Palpitations: Secondary | ICD-10-CM | POA: Diagnosis not present

## 2017-02-10 DIAGNOSIS — I10 Essential (primary) hypertension: Secondary | ICD-10-CM | POA: Diagnosis not present

## 2017-02-10 DIAGNOSIS — R7989 Other specified abnormal findings of blood chemistry: Secondary | ICD-10-CM | POA: Insufficient documentation

## 2017-02-10 DIAGNOSIS — R946 Abnormal results of thyroid function studies: Secondary | ICD-10-CM | POA: Diagnosis not present

## 2017-02-10 DIAGNOSIS — F411 Generalized anxiety disorder: Secondary | ICD-10-CM

## 2017-02-10 HISTORY — DX: Palpitations: R00.2

## 2017-02-10 HISTORY — DX: Generalized anxiety disorder: F41.1

## 2017-02-10 HISTORY — DX: Essential (primary) hypertension: I10

## 2017-02-10 MED ORDER — DESVENLAFAXINE SUCCINATE ER 50 MG PO TB24: 50.0000 mg | ORAL_TABLET | Freq: Every day | ORAL | 1 refills | Status: DC

## 2017-02-10 MED ORDER — METOPROLOL TARTRATE 25 MG PO TABS: 50.0000 mg | ORAL_TABLET | Freq: Two times a day (BID) | ORAL | 3 refills | Status: DC

## 2017-02-10 MED FILL — DESVENLAFAXINE SUC ER 50 MG: 50 | 30 days supply | Qty: 30 | Fill #0

## 2017-02-10 NOTE — Progress Notes (Signed)
HPI: Deborah Goodman is a 41 y.o. female  who presents to [facility] today, 02/10/17,  for chief complaint of:  Chief Complaint  Patient presents with  . Establish Care    HEADACHES, DIZZINESS, HEART PALPITATIONS, ANXIETY    Palpitations: Currently undergoing workup with cardiology. Office visit they're 01/09/2017. Records reviewed. Has been ongoing past month or two, intermittent palpitations, don't seem to be chigger by anything in particular. Has had several evaluations in the emergency department, sinus tachycardia has been noted on telemetry. Cardiology arranged a Holter monitor and echocardiogram. Echo appointment is still pending. No report as of yet from the Holter monitor, but patient states that she was actually called by the monitor company while she was wearing it with concern for a problem. Telephone notes from cardiology also indicate that the patient accidentally reported a syncopal event. Patient states that she has never lost consciousness. She is currently on metoprolol 25 mg twice a day  Hypertension: Has noticed elevated blood pressures, works in healthcare system, has had some coworkers check it while she is feeling anxious/palpitations and she states has been as high as 180/100. Slight elevation in the office today. On metoprolol per medication list. Never been on any other blood pressure medications.  Anxiety: Patient states that she has been on several antianxiety medications in the past. Paxil was not helpful and caused sweating. Lexapro was then tried but caused weight gain. Prozac she just felt wasn't really helpful. Has been off of antianxiety medications for some time. She is concerned that the above symptoms/problems are related in anxiety/panic issues like to discuss getting back on medications     Past medical, surgical, social and family history reviewed: There are no active problems to display for this patient.  Past  Surgical History:  Procedure Laterality Date  . SACRO-ILIAC PINNING  2016   fusion, in [place] History  Substance Use Topics  . Smoking status: Current Every Day Smoker    Packs/day: 0.50    Years: 22.00    Types: Cigarettes  . Smokeless tobacco: Never Used  . Alcohol use No   Family History  Problem Relation Age of Onset  . Depression Mother   . Hypertension Mother   . Alcohol abuse Father   . Diabetes Father   . Arrhythmia Sister     palpitations  . Cancer Maternal Grandfather     COLON  . Diabetes Paternal Grandfather      Current medication list and allergy/intolerance information reviewed:   Current Outpatient Prescriptions  Medication Sig Dispense Refill  . B Complex-C (B-COMPLEX WITH VITAMIN C) tablet Take 1 tablet by mouth daily.    . cholecalciferol (VITAMIN D) 1000 units tablet Take 1,000 Units by mouth daily.    . metoprolol tartrate (LOPRESSOR) 25 MG tablet Take 1 tablet (25 mg total) by mouth 2 (two) times daily. 60 tablet 3  . milk thistle 175 MG tablet Take 175 mg by mouth daily.    . Multiple Vitamin (MULTIVITAMIN WITH MINERALS) TABS tablet Take 1 tablet by mouth daily.    . Omega-3 Fatty Acids (FISH OIL PO) Take 1 capsule by mouth daily.     No current facility-administered medications for this visit.    Allergies  Allergen Reactions  . Fish Allergy Nausea And Vomiting    ALL SEAFOOD  . Shellfish Allergy Nausea And Vomiting    ALL SEAFOOD  . Sulfa Antibiotics Other (See Comments)    Thrush reaction  Review of Systems:  Constitutional:  No  fever, no chills, No recent illness, No unintentional weight changes. No significant fatigue.   HEENT: No  headache, no vision change, no hearing change, No sore throat, No  sinus pressure  Cardiac: No  chest pain, No  pressure, No palpitations, No  Orthopnea  Respiratory:  No  shortness of breath. No  Cough  Gastrointestinal: No  abdominal pain, No  nausea, No  vomiting,  No  blood in stool,  No  diarrhea, No  constipation   Musculoskeletal: No new myalgia/arthralgia  Genitourinary: No  incontinence, No  abnormal genital bleeding, No abnormal genital discharge  Skin: No  Rash, No other wounds/concerning lesions  Hem/Onc: No  easy bruising/bleeding, No  abnormal lymph node  Endocrine: No cold intolerance,  No heat intolerance. No polyuria/polydipsia/polyphagia   Neurologic: No  weakness, No  dizziness, No  slurred speech/focal weakness/facial droop  Psychiatric: No  concerns with depression, No  concerns with anxiety, No sleep problems, No mood problems  Exam:  BP (!) 149/97   Pulse 79   Ht 5' 6"  (1.676 m)   Wt 186 lb (84.4 kg)   BMI 30.02 kg/m   Constitutional: VS see above. General Appearance: alert, well-developed, well-nourished, NAD  Eyes: Normal lids and conjunctive, non-icteric sclera  Ears, Nose, Mouth, Throat: MMM, Normal external inspection ears/nares/mouth/lips/gums. , trachea midline. No thyroid enlargement  Respiratory: Normal respiratory effort. no wheeze, no rhonchi, no rales  Cardiovascular: S1/S2 normal, very faint systolic murmur, no rub/gallop auscultated. RRR. No lower extremity edema.  Musculoskeletal: Gait normal. No clubbing/cyanosis of digits.   Neurological: Normal balance/coordination. No tremor.   Skin: warm, dry, intact. No rash/ulcer  Psychiatric: Normal judgment/insight. Normal mood and affect. Oriented x3.   EKG interpretation: Rate: 74 Rhythm: sinus No ST/T changes concerning for acute ischemia/infarct     Depression screen [facility] 2/9 02/10/2017  Decreased Interest 1  Down, Depressed, Hopeless 1  PHQ - 2 Score 2  Altered sleeping 1  Tired, decreased energy 2  Change in appetite 2  Feeling bad or failure about yourself  1  Trouble concentrating 0  Moving slowly or fidgety/restless 2  Suicidal thoughts 1  PHQ-9 Score 11   GAD 7 : Generalized Anxiety Score 02/10/2017  Nervous, Anxious, on Edge 3  Control/stop worrying  1  Worry too much - different things 1  Trouble relaxing 2  Restless 2  Easily annoyed or irritable 2  Afraid - awful might happen 1  Total GAD 7 Score 12   Recent Results (from the past 2160 hour(s))  Basic metabolic panel     Status: Abnormal   Collection Time: 11/18/16 12:46 AM  Result Value Ref Range   Sodium 137 135 - 145 mmol/L   Potassium 3.0 (L) 3.5 - 5.1 mmol/L   Chloride 101 101 - 111 mmol/L   CO2 25 22 - 32 mmol/L   Glucose, Bld 106 (H) 65 - 99 mg/dL   BUN 9 6 - 20 mg/dL   Creatinine, Ser 0.62 0.44 - 1.00 mg/dL   Calcium 9.1 8.9 - 10.3 mg/dL   GFR calc non Af Amer >60 >60 mL/min   GFR calc Af Amer >60 >60 mL/min    Comment: (NOTE) The eGFR has been calculated using the CKD EPI equation. This calculation has not been validated in all clinical situations. eGFR's persistently <60 mL/min signify possible Chronic Kidney Disease.    Anion gap 11 5 - 15  CBC  Status: None   Collection Time: 11/18/16 12:46 AM  Result Value Ref Range   WBC 10.2 4.0 - 10.5 K/uL   RBC 4.40 3.87 - 5.11 MIL/uL   Hemoglobin 14.1 12.0 - 15.0 g/dL   HCT 40.9 36.0 - 46.0 %   MCV 93.0 78.0 - 100.0 fL   MCH 32.0 26.0 - 34.0 pg   MCHC 34.5 30.0 - 36.0 g/dL   RDW 12.4 11.5 - 15.5 %   Platelets 333 150 - 400 K/uL  I-stat troponin, ED     Status: None   Collection Time: 11/18/16 12:55 AM  Result Value Ref Range   Troponin i, poc 0.00 0.00 - 0.08 ng/mL   Comment 3            Comment: Due to the release kinetics of cTnI, a negative result within the first hours of the onset of symptoms does not rule out myocardial infarction with certainty. If myocardial infarction is still suspected, repeat the test at appropriate intervals.   TSH     Status: Abnormal   Collection Time: 11/18/16  2:53 AM  Result Value Ref Range   TSH 5.319 (H) 0.350 - 4.500 uIU/mL    Comment: Performed by a 3rd Generation assay with a functional sensitivity of <=0.01 uIU/mL.  CBC with Differential     Status: Abnormal    Collection Time: 12/27/16  9:45 AM  Result Value Ref Range   WBC 12.9 (H) 4.0 - 10.5 K/uL   RBC 4.90 3.87 - 5.11 MIL/uL   Hemoglobin 15.9 (H) 12.0 - 15.0 g/dL   HCT 45.7 36.0 - 46.0 %   MCV 93.3 78.0 - 100.0 fL   MCH 32.4 26.0 - 34.0 pg   MCHC 34.8 30.0 - 36.0 g/dL   RDW 12.6 11.5 - 15.5 %   Platelets 340 150 - 400 K/uL   Neutrophils Relative % 64 %   Neutro Abs 8.3 (H) 1.7 - 7.7 K/uL   Lymphocytes Relative 27 %   Lymphs Abs 3.5 0.7 - 4.0 K/uL   Monocytes Relative 6 %   Monocytes Absolute 0.8 0.1 - 1.0 K/uL   Eosinophils Relative 3 %   Eosinophils Absolute 0.3 0.0 - 0.7 K/uL   Basophils Relative 0 %   Basophils Absolute 0.0 0.0 - 0.1 K/uL  Basic metabolic panel     Status: Abnormal   Collection Time: 12/27/16  9:45 AM  Result Value Ref Range   Sodium 139 135 - 145 mmol/L   Potassium 3.5 3.5 - 5.1 mmol/L   Chloride 103 101 - 111 mmol/L   CO2 25 22 - 32 mmol/L   Glucose, Bld 98 65 - 99 mg/dL   BUN 5 (L) 6 - 20 mg/dL   Creatinine, Ser 0.76 0.44 - 1.00 mg/dL   Calcium 9.4 8.9 - 10.3 mg/dL   GFR calc non Af Amer >60 >60 mL/min   GFR calc Af Amer >60 >60 mL/min    Comment: (NOTE) The eGFR has been calculated using the CKD EPI equation. This calculation has not been validated in all clinical situations. eGFR's persistently <60 mL/min signify possible Chronic Kidney Disease.    Anion gap 11 5 - 15  I-Stat Beta hCG blood, ED ([facility], [facility], [facility] only)     Status: None   Collection Time: 12/27/16  9:51 AM  Result Value Ref Range   I-stat hCG, quantitative <5.0 <5 mIU/mL   Comment 3  Comment:   GEST. AGE      CONC.  (mIU/mL)   <=1 WEEK        5 - 50     2 WEEKS       50 - 500     3 WEEKS       100 - 10,000     4 WEEKS     1,000 - 30,000        FEMALE AND NON-PREGNANT FEMALE:     LESS THAN 5 mIU/mL   TSH     Status: Abnormal   Collection Time: 12/27/16 10:11 AM  Result Value Ref Range   TSH 4.781 (H) 0.350 - 4.500 uIU/mL    Comment: Performed by a 3rd Generation assay  with a functional sensitivity of <=0.01 uIU/mL.  I-stat troponin, ED     Status: None   Collection Time: 12/27/16 10:15 AM  Result Value Ref Range   Troponin i, poc 0.00 0.00 - 0.08 ng/mL   Comment 3            Comment: Due to the release kinetics of cTnI, a negative result within the first hours of the onset of symptoms does not rule out myocardial infarction with certainty. If myocardial infarction is still suspected, repeat the test at appropriate intervals.   I-stat troponin, ED     Status: None   Collection Time: 12/27/16 12:52 PM  Result Value Ref Range   Troponin i, poc 0.00 0.00 - 0.08 ng/mL   Comment 3            Comment: Due to the release kinetics of cTnI, a negative result within the first hours of the onset of symptoms does not rule out myocardial infarction with certainty. If myocardial infarction is still suspected, repeat the test at appropriate intervals.       ASSESSMENT/PLAN:   Has been through several SSRIs without much success, will trial SNRI for anxiety issues. Discussed possible side effects versus benefits. Patient advised alert me if any concerning side effects  Blood pressure elevated and palpitations poorly controlled, at this point, would go up a bit on the metoprolol and see if this addresses both issues. Patient is advised to keep follow-up appointments with cardiology, I do not currently have the Holter monitor results available  Return in one week for blood pressure recheck on increase metoprolol  Palpitations - Plan: EKG 12-Lead, metoprolol tartrate (LOPRESSOR) 25 MG tablet, Thyroid Panel With TSH, COMPLETE METABOLIC PANEL WITH GFR, CBC with Differential/Platelet  GAD (generalized anxiety disorder) - Plan: desvenlafaxine (PRISTIQ) 50 MG 24 hr tablet, DISCONTINUED: desvenlafaxine (PRISTIQ) 50 MG 24 hr tablet  Hypertension, unspecified type - Plan: metoprolol tartrate (LOPRESSOR) 25 MG tablet  Abnormal thyroid blood test - Plan: Thyroid Panel  With TSH    Visit summary with medication list and pertinent instructions was printed for patient to review. All questions at time of visit were answered - patient instructed to contact office with any additional concerns. ER/RTC precautions were reviewed with the patient. Follow-up plan: Return in about 1 week (around 02/17/2017) for recheck blood pressurenj.

## 2017-02-11 LAB — COMPLETE METABOLIC PANEL WITH GFR
ALBUMIN: 4.4 g/dL (ref 3.6–5.1)
ALK PHOS: 61 U/L (ref 33–115)
ALT: 133 U/L — AB (ref 6–29)
AST: 104 U/L — AB (ref 10–30)
BUN: 9 mg/dL (ref 7–25)
CO2: 26 mmol/L (ref 20–31)
CREATININE: 0.71 mg/dL (ref 0.50–1.10)
Calcium: 9.3 mg/dL (ref 8.6–10.2)
Chloride: 101 mmol/L (ref 98–110)
GFR, Est African American: >89 mL/min (ref >=60)
GFR, Est Non African American: >89 mL/min (ref >=60)
GLUCOSE: 78 mg/dL (ref 65–99)
POTASSIUM: 3.4 mmol/L — AB (ref 3.5–5.3)
SODIUM: 138 mmol/L (ref 135–146)
TOTAL PROTEIN: 7.3 g/dL (ref 6.1–8.1)
Total Bilirubin: 1 mg/dL (ref 0.2–1.2)

## 2017-02-11 LAB — CBC WITH DIFFERENTIAL/PLATELET
BASOS ABS: 0 {cells}/uL (ref 0–200)
Basophils Relative: 0 %
EOS PCT: 3 %
Eosinophils Absolute: 324 cells/uL (ref 15–500)
HEMATOCRIT: 42.4 % (ref 35.0–45.0)
HEMOGLOBIN: 14 g/dL (ref 11.7–15.5)
LYMPHS ABS: 2484 {cells}/uL (ref 850–3900)
LYMPHS PCT: 23 %
MCH: 30.9 pg (ref 27.0–33.0)
MCHC: 33 g/dL (ref 32.0–36.0)
MCV: 93.6 fL (ref 80.0–100.0)
MPV: 9.7 fL (ref 7.5–12.5)
Monocytes Absolute: 1080 cells/uL — ABNORMAL HIGH (ref 200–950)
Monocytes Relative: 10 %
NEUTROS PCT: 64 %
Neutro Abs: 6912 cells/uL (ref 1500–7800)
Platelets: 307 10*3/uL (ref 140–400)
RBC: 4.53 MIL/uL (ref 3.80–5.10)
RDW: 12.7 % (ref 11.0–15.0)
WBC: 10.8 10*3/uL (ref 3.8–10.8)

## 2017-02-11 LAB — THYROID PANEL WITH TSH
Free Thyroxine Index: 2.7 (ref 1.4–3.8)
T3 UPTAKE: 29 % (ref 22–35)
T4, Total: 9.3 ug/dL (ref 4.5–12.0)
TSH: 2.49 mIU/L

## 2017-02-14 ENCOUNTER — Encounter: Payer: Self-pay | Admitting: Osteopathic Medicine

## 2017-02-20 ENCOUNTER — Other Ambulatory Visit (HOSPITAL_COMMUNITY): Payer: 59

## 2017-02-21 ENCOUNTER — Ambulatory Visit: Payer: 59 | Admitting: Osteopathic Medicine

## 2017-02-22 ENCOUNTER — Ambulatory Visit: Payer: 59 | Admitting: Osteopathic Medicine

## 2017-02-23 ENCOUNTER — Ambulatory Visit: Payer: 59 | Admitting: Osteopathic Medicine

## 2017-02-27 ENCOUNTER — Ambulatory Visit: Payer: 59 | Admitting: Physician Assistant

## 2017-02-27 ENCOUNTER — Telehealth: Payer: Self-pay | Admitting: Osteopathic Medicine

## 2017-02-27 NOTE — Telephone Encounter (Signed)
Please call patient: I received some FMLA forms for her but we did not dicsussed missed work times in detail at her last visit. She is overdue for recheck from our last visit, please schedule followup and at that visit we can go over FMLA in detail and complete the forms accurately

## 2017-02-27 NOTE — Telephone Encounter (Signed)
Left detailed message on patient vm with information as noted below. Rhonda Cunningham,CMA  

## 2017-03-01 ENCOUNTER — Ambulatory Visit (INDEPENDENT_AMBULATORY_CARE_PROVIDER_SITE_OTHER): Payer: 59 | Admitting: Osteopathic Medicine

## 2017-03-01 ENCOUNTER — Encounter: Payer: Self-pay | Admitting: Osteopathic Medicine

## 2017-03-01 VITALS — BP 145/100 | HR 71 | Ht 66.0 in | Wt 187.0 lb

## 2017-03-01 DIAGNOSIS — R748 Abnormal levels of other serum enzymes: Secondary | ICD-10-CM | POA: Diagnosis not present

## 2017-03-01 DIAGNOSIS — F411 Generalized anxiety disorder: Secondary | ICD-10-CM

## 2017-03-01 DIAGNOSIS — R002 Palpitations: Secondary | ICD-10-CM

## 2017-03-01 DIAGNOSIS — I10 Essential (primary) hypertension: Secondary | ICD-10-CM

## 2017-03-01 LAB — COMPLETE METABOLIC PANEL WITH GFR
ALBUMIN: 4.4 g/dL (ref 3.6–5.1)
ALT: 71 U/L — ABNORMAL HIGH (ref 6–29)
AST: 40 U/L — AB (ref 10–30)
Alkaline Phosphatase: 69 U/L (ref 33–115)
BUN: 8 mg/dL (ref 7–25)
CHLORIDE: 102 mmol/L (ref 98–110)
CO2: 27 mmol/L (ref 20–31)
Calcium: 9.2 mg/dL (ref 8.6–10.2)
Creat: 0.7 mg/dL (ref 0.50–1.10)
GFR, Est African American: >89 mL/min (ref >=60)
GLUCOSE: 98 mg/dL (ref 65–99)
POTASSIUM: 3.6 mmol/L (ref 3.5–5.3)
SODIUM: 137 mmol/L (ref 135–146)
Total Bilirubin: 0.7 mg/dL (ref 0.2–1.2)
Total Protein: 7.4 g/dL (ref 6.1–8.1)

## 2017-03-01 MED ORDER — DESVENLAFAXINE SUCCINATE ER 50 MG PO TB24: 50.0000 mg | ORAL_TABLET | Freq: Every day | ORAL | 3 refills | Status: DC

## 2017-03-01 NOTE — Progress Notes (Signed)
HPI: Deborah Goodman is a 41 y.o. female  who presents to [place] [facility] today, 03/01/17,  for chief complaint of:  Chief Complaint  Patient presents with  . Follow-up    BLOOD PRESSURE   Palpitations: following with cardiology, overall better but still seem to be happening occasionally even on beta blocker. 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   FMLA: missed 4 consecutive days of work for illness w/ episode of palpitaitons midway through absence. Seen in ER. Forms filled out best I could, I wasn't the treating physician for the absence  Elevated liver enzymes: incidental finding on labs - pt doesn't recall EtOH consumption, compared to outside labs this is a significant change.   Anxiety: doing better on Pristiq, palpitations have maybe gotten a bit less.   HTN: pt reports checking at work and is S 120s, D 43s.    Past medical history, surgical history, social history and family history reviewed.  Patient Active Problem List   Diagnosis Date Noted  . Palpitations 02/10/2017  . GAD (generalized anxiety disorder) 02/10/2017  . Hypertension 02/10/2017  . Abnormal thyroid blood test 02/10/2017    Current medication list and allergy/intolerance information reviewed.   Current Outpatient Prescriptions on File Prior to Visit  Medication Sig Dispense Refill  . B Complex-C (B-COMPLEX WITH VITAMIN C) tablet Take 1 tablet by mouth daily.    . cholecalciferol (VITAMIN D) 1000 units tablet Take 1,000 Units by mouth daily.    Marland Kitchen desvenlafaxine (PRISTIQ) 50 MG 24 hr tablet Take 1 tablet (50 mg total) by mouth daily. 30 tablet 1  . metoprolol tartrate (LOPRESSOR) 25 MG tablet Take 2 tablets (50 mg total) by mouth 2 (two) times daily. 60 tablet 3  . milk thistle 175 MG tablet Take 175 mg by mouth daily.    . Multiple Vitamin (MULTIVITAMIN WITH MINERALS) TABS tablet Take 1 tablet by mouth daily.    . Omega-3 Fatty Acids (FISH OIL PO) Take 1 capsule by mouth daily.      No current facility-administered medications on file prior to visit.    Allergies  Allergen Reactions  . Fish Allergy Nausea And Vomiting    ALL SEAFOOD  . Shellfish Allergy Nausea And Vomiting    ALL SEAFOOD  . Sulfa Antibiotics Other (See Comments)    Thrush reaction      Review of Systems:  Constitutional: No recent illness  HEENT: No  headache, no vision change  Cardiac: No  chest pain, No  pressure, No palpitations  Respiratory:  No  shortness of breath. No  Cough  Gastrointestinal: No  abdominal pain, no change on bowel habits  Musculoskeletal: No new myalgia/arthralgia  Skin: No  Rash  Hem/Onc: No  easy bruising/bleeding, No  abnormal lumps/bumps  Neurologic: No  weakness, No  Dizziness  Psychiatric: No  concerns with depression, No  concerns with anxiety  Exam:  BP (!) 158/103   Pulse 71   Ht 5' 6" (1.676 m)   Wt 187 lb (84.8 kg)   BMI 30.18 kg/m   Constitutional: VS see above. General Appearance: alert, well-developed, well-nourished, NAD  Eyes: Normal lids and conjunctive, non-icteric sclera  Ears, Nose, Mouth, Throat: MMM, Normal external inspection ears/nares/mouth/lips/gums.  Neck: No masses, trachea midline.   Respiratory: Normal respiratory effort. no wheeze, no rhonchi, no rales  Cardiovascular: S1/S2 normal, no murmur, no rub/gallop auscultated. RRR.   Musculoskeletal: Gait normal. Symmetric and independent movement of all extremities  Neurological: Normal balance/coordination. No  tremor.  Skin: warm, dry, intact.   Psychiatric: Normal judgment/insight. Normal mood and affect. Oriented x3.    Recent Results (from the past 2160 hour(s))  CBC with Differential     Status: Abnormal   Collection Time: 12/27/16  9:45 AM  Result Value Ref Range   WBC 12.9 (H) 4.0 - 10.5 K/uL   RBC 4.90 3.87 - 5.11 MIL/uL   Hemoglobin 15.9 (H) 12.0 - 15.0 g/dL   HCT 45.7 36.0 - 46.0 %   MCV 93.3 78.0 - 100.0 fL   MCH 32.4 26.0 - 34.0 pg   MCHC  34.8 30.0 - 36.0 g/dL   RDW 12.6 11.5 - 15.5 %   Platelets 340 150 - 400 K/uL   Neutrophils Relative % 64 %   Neutro Abs 8.3 (H) 1.7 - 7.7 K/uL   Lymphocytes Relative 27 %   Lymphs Abs 3.5 0.7 - 4.0 K/uL   Monocytes Relative 6 %   Monocytes Absolute 0.8 0.1 - 1.0 K/uL   Eosinophils Relative 3 %   Eosinophils Absolute 0.3 0.0 - 0.7 K/uL   Basophils Relative 0 %   Basophils Absolute 0.0 0.0 - 0.1 K/uL  Basic metabolic panel     Status: Abnormal   Collection Time: 12/27/16  9:45 AM  Result Value Ref Range   Sodium 139 135 - 145 mmol/L   Potassium 3.5 3.5 - 5.1 mmol/L   Chloride 103 101 - 111 mmol/L   CO2 25 22 - 32 mmol/L   Glucose, Bld 98 65 - 99 mg/dL   BUN 5 (L) 6 - 20 mg/dL   Creatinine, Ser 0.76 0.44 - 1.00 mg/dL   Calcium 9.4 8.9 - 10.3 mg/dL   GFR calc non Af Amer >60 >60 mL/min   GFR calc Af Amer >60 >60 mL/min    Comment: (NOTE) The eGFR has been calculated using the CKD EPI equation. This calculation has not been validated in all clinical situations. eGFR's persistently <60 mL/min signify possible Chronic Kidney Disease.    Anion gap 11 5 - 15  I-Stat Beta hCG blood, ED ([facility], [facility], [facility] only)     Status: None   Collection Time: 12/27/16  9:51 AM  Result Value Ref Range   I-stat hCG, quantitative <5.0 <5 mIU/mL   Comment 3            Comment:   GEST. AGE      CONC.  (mIU/mL)   <=1 WEEK        5 - 50     2 WEEKS       50 - 500     3 WEEKS       100 - 10,000     4 WEEKS     1,000 - 30,000        FEMALE AND NON-PREGNANT FEMALE:     LESS THAN 5 mIU/mL   TSH     Status: Abnormal   Collection Time: 12/27/16 10:11 AM  Result Value Ref Range   TSH 4.781 (H) 0.350 - 4.500 uIU/mL    Comment: Performed by a 3rd Generation assay with a functional sensitivity of <=0.01 uIU/mL.  I-stat troponin, ED     Status: None   Collection Time: 12/27/16 10:15 AM  Result Value Ref Range   Troponin i, poc 0.00 0.00 - 0.08 ng/mL   Comment 3            Comment: Due to the release kinetics  of cTnI, a negative   result within the first hours of the onset of symptoms does not rule out myocardial infarction with certainty. If myocardial infarction is still suspected, repeat the test at appropriate intervals.   I-stat troponin, ED     Status: None   Collection Time: 12/27/16 12:52 PM  Result Value Ref Range   Troponin i, poc 0.00 0.00 - 0.08 ng/mL   Comment 3            Comment: Due to the release kinetics of cTnI, a negative result within the first hours of the onset of symptoms does not rule out myocardial infarction with certainty. If myocardial infarction is still suspected, repeat the test at appropriate intervals.   Thyroid Panel With TSH     Status: None   Collection Time: 02/10/17 11:18 AM  Result Value Ref Range   T4, Total 9.3 4.5 - 12.0 ug/dL   T3 Uptake 29 22 - 35 %   Free Thyroxine Index 2.7 1.4 - 3.8   TSH 2.49 mIU/L    Comment:   Reference Range   > or = 20 Years  0.40-4.50   Pregnancy Range First trimester  0.26-2.66 Second trimester 0.55-2.73 Third trimester  0.43-2.91     COMPLETE METABOLIC PANEL WITH GFR     Status: Abnormal   Collection Time: 02/10/17 11:18 AM  Result Value Ref Range   Sodium 138 135 - 146 mmol/L   Potassium 3.4 (L) 3.5 - 5.3 mmol/L   Chloride 101 98 - 110 mmol/L   CO2 26 20 - 31 mmol/L   Glucose, Bld 78 65 - 99 mg/dL   BUN 9 7 - 25 mg/dL   Creat 0.71 0.50 - 1.10 mg/dL   Total Bilirubin 1.0 0.2 - 1.2 mg/dL   Alkaline Phosphatase 61 33 - 115 U/L   AST 104 (H) 10 - 30 U/L   ALT 133 (H) 6 - 29 U/L   Total Protein 7.3 6.1 - 8.1 g/dL   Albumin 4.4 3.6 - 5.1 g/dL   Calcium 9.3 8.6 - 10.2 mg/dL   GFR, Est African American >89 >=60 mL/min   GFR, Est Non African American >89 >=60 mL/min  CBC with Differential/Platelet     Status: Abnormal   Collection Time: 02/10/17 11:18 AM  Result Value Ref Range   WBC 10.8 3.8 - 10.8 K/uL   RBC 4.53 3.80 - 5.10 MIL/uL   Hemoglobin 14.0 11.7 - 15.5 g/dL   HCT 42.4 35.0 - 45.0 %    MCV 93.6 80.0 - 100.0 fL   MCH 30.9 27.0 - 33.0 pg   MCHC 33.0 32.0 - 36.0 g/dL   RDW 12.7 11.0 - 15.0 %   Platelets 307 140 - 400 K/uL   MPV 9.7 7.5 - 12.5 fL   Neutro Abs 6,912 1,500 - 7,800 cells/uL   Lymphs Abs 2,484 850 - 3,900 cells/uL   Monocytes Absolute 1,080 (H) 200 - 950 cells/uL   Eosinophils Absolute 324 15 - 500 cells/uL   Basophils Absolute 0 0 - 200 cells/uL   Neutrophils Relative % 64 %   Lymphocytes Relative 23 %   Monocytes Relative 10 %   Eosinophils Relative 3 %   Basophils Relative 0 %   Smear Review Criteria for review not met     No results found.  GAD 7 : Generalized Anxiety Score 02/10/2017  Nervous, Anxious, on Edge 3  Control/stop worrying 1  Worry too much - different things 1  Trouble relaxing 2  Restless 2    Easily annoyed or irritable 2  Afraid - awful might happen 1  Total GAD 7 Score 12    Depression screen PHQ 2/9 02/10/2017  Decreased Interest 1  Down, Depressed, Hopeless 1  PHQ - 2 Score 2  Altered sleeping 1  Tired, decreased energy 2  Change in appetite 2  Feeling bad or failure about yourself  1  Trouble concentrating 0  Moving slowly or fidgety/restless 2  Suicidal thoughts 1  PHQ-9 Score 11      ASSESSMENT/PLAN:   Palpitations  GAD (generalized anxiety disorder) - Plan: desvenlafaxine (PRISTIQ) 50 MG 24 hr tablet  Elevated liver enzymes - Plan: COMPLETE METABOLIC PANEL WITH GFR  Hypertension, unspecified type - Follow w/ cardioogy, ?white coat syndrome- keep logs      Follow-up plan: Return for annual physical when due . Keep appts w/ cardiology   Visit summary with medication list and pertinent instructions was printed for patient to review, alert us if any changes needed. All questions at time of visit were answered - patient instructed to contact office with any additional concerns. ER/RTC precautions were reviewed with the patient and understanding verbalized.        

## 2017-03-02 NOTE — Addendum Note (Signed)
Addended by: Maryla Morrow on: 03/02/2017 04:59 PM   Modules accepted: Orders

## 2017-03-06 MED FILL — DESVENLAFAXINE SUC ER 50 MG: 50 | 90 days supply | Qty: 90 | Fill #0

## 2017-03-07 ENCOUNTER — Ambulatory Visit (HOSPITAL_COMMUNITY): Payer: 59 | Attending: Cardiology

## 2017-03-07 ENCOUNTER — Other Ambulatory Visit: Payer: Self-pay

## 2017-03-07 DIAGNOSIS — R002 Palpitations: Secondary | ICD-10-CM | POA: Diagnosis not present

## 2017-03-07 DIAGNOSIS — I10 Essential (primary) hypertension: Secondary | ICD-10-CM | POA: Insufficient documentation

## 2017-03-07 DIAGNOSIS — I071 Rheumatic tricuspid insufficiency: Secondary | ICD-10-CM | POA: Diagnosis not present

## 2017-03-11 ENCOUNTER — Encounter: Payer: Self-pay | Admitting: Osteopathic Medicine

## 2017-03-12 NOTE — Progress Notes (Signed)
Cardiology Office Note    Date:  03/13/2017   ID:  Deborah Goodman, DOB 04-Nov-1976, MRN [id number]  PCP:  Emeterio Reeve, DO  Cardiologist:  Dr. Debara Pickett  Chief Complaint  Patient presents with  . Follow-up    for recent echo and event monitor    History of Present Illness:    Deborah Goodman is a 41 y.o. female with past medical history of GERD, depression, and tobacco use who presents to the office for 6-week Cardiology follow-up.   She was seen by Rosaria Ferries, PA-C on 01/09/2017 as a new patient referral for palpitations. Reported symptoms occurring almost daily and usually lasting less than 10 minutes. She had been seen in the ER for this with only sinus tachycardia being documented. An event monitor was ordered and she was started on Lopressor 25mg  BID halfway through the monitoring period. Her monitor resulted and showed an episode of pSVT with HR in the 170's. An echocardiogram was also obtained which showed an EF of 60-65% with no regional WMA.    In talking with the patient today, she reports significant improvement in her palpitations since starting Lopressor. She has also established care with a PCP and was prescribed Pristiq with improvement in her anxiety which she thinks has significantly helped with her symptoms as well.  She does describe some episodes of palpitations which she notices occurs more commonly at the beginning of her menstrual cycle. She questions whether or not she is going through menopause as her menstrual cycles are changing in intensity and frequency.  When seen by her PCP, Lopressor was increased from 25 mg BID to 50 mg BID due to her elevated blood pressure. She does not check blood pressure at home, but it is well controlled today at 129/85. She denies any associated lightheadedness, dizziness, presyncope, or fatigue with this recent adjustment.  She is smoking 0.5 ppd but is actively trying to quit. She does consume 2-3 beers nightly and was  educated on the link between palpitations and alcohol use.     Past Medical History:  Diagnosis Date  . Acid reflux   . Depression   . GAD (generalized anxiety disorder) 02/10/2017  . Hypertension 02/10/2017  . Palpitations 02/10/2017   a. 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   . Systolic murmur   . Tobacco use     Past Surgical History:  Procedure Laterality Date  . SACRO-ILIAC PINNING  2016   fusion, in [place]    Current Medications: Outpatient Medications Prior to Visit  Medication Sig Dispense Refill  . B Complex-C (B-COMPLEX WITH VITAMIN C) tablet Take 1 tablet by mouth daily.    . cholecalciferol (VITAMIN D) 1000 units tablet Take 1,000 Units by mouth daily.    Marland Kitchen desvenlafaxine (PRISTIQ) 50 MG 24 hr tablet Take 1 tablet (50 mg total) by mouth daily. 90 tablet 3  . milk thistle 175 MG tablet Take 175 mg by mouth daily.    . Multiple Vitamin (MULTIVITAMIN WITH MINERALS) TABS tablet Take 1 tablet by mouth daily.    . Omega-3 Fatty Acids (FISH OIL PO) Take 1 capsule by mouth daily.    . metoprolol tartrate (LOPRESSOR) 25 MG tablet Take 2 tablets (50 mg total) by mouth 2 (two) times daily. 60 tablet 3   No facility-administered medications prior to visit.      Allergies:   Fish allergy; Shellfish allergy; and Sulfa antibiotics   Social History   Social  History  . Marital status: Single    Spouse name: N/A  . Number of children: N/A  . Years of education: N/A   Occupational History  . 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 Smoking status: Current Every Day Smoker    Packs/day: 0.50    Years: 22.00    Types: Cigarettes  . Smokeless tobacco: Never Used  . Alcohol use No  . Drug use: No  . Sexual activity: Not Asked   Other Topics Concern  . None   Social History Narrative  . None     Family History:  The patient's family history includes Alcohol abuse in her father; Arrhythmia in her sister; Cancer in her  maternal grandfather; Depression in her mother; Diabetes in her father and paternal grandfather; Hypertension in her mother.   Review of Systems:   Please see the history of present illness.     General:  No chills, fever, night sweats or weight changes.  Cardiovascular:  No chest pain, dyspnea on exertion, edema, orthopnea, paroxysmal nocturnal dyspnea. Positive for palpitations.  Dermatological: No rash, lesions/masses Respiratory: No cough, dyspnea Urologic: No hematuria, dysuria Abdominal:   No nausea, vomiting, diarrhea, bright red blood per rectum, melena, or hematemesis Neurologic:  No visual changes, wkns, changes in mental status. All other systems reviewed and are otherwise negative except as noted above.   Physical Exam:    VS:  BP 129/85   Pulse 67   Ht 5\' 6"  (1.676 m)   Wt 185 lb (83.9 kg)   SpO2 98%   BMI 29.86 kg/m    General: Well developed, well nourished Caucasian female appearing in no acute distress. Head: Normocephalic, atraumatic, sclera non-icteric, no xanthomas, nares are without discharge.  Neck: No carotid bruits. JVD not elevated.  Lungs: Respirations regular and unlabored, without wheezes or rales.  Heart: Regular rate and rhythm. No S3 or S4.  No murmur, no rubs, or gallops appreciated. Abdomen: Soft, non-tender, non-distended with normoactive bowel sounds. No hepatomegaly. No rebound/guarding. No obvious abdominal masses. Msk:  Strength and tone appear normal for age. No joint deformities or effusions. Extremities: No clubbing or cyanosis. No lower extremity edema.  Distal pedal pulses are 2+ bilaterally. Neuro: Alert and oriented X 3. Moves all extremities spontaneously. No focal deficits noted. Psych:  Responds to questions appropriately with a normal affect. Skin: No rashes or lesions noted  Wt Readings from Last 3 Encounters:  03/13/17 185 lb (83.9 kg)  03/01/17 187 lb (84.8 kg)  02/10/17 186 lb (84.4 kg)     Studies/Labs Reviewed:   EKG:   EKG is not ordered today.   Recent Labs: 02/10/2017: Hemoglobin 14.0; Platelets 307; TSH 2.49 03/01/2017: ALT 71; BUN 8; Creat 0.70; Potassium 3.6; Sodium 137   Lipid Panel No results found for: CHOL, TRIG, HDL, CHOLHDL, VLDL, LDLCALC, LDLDIRECT  Additional studies/ records that were reviewed today include:   Echocardiogram: 03/07/2017 Study Conclusions  - Left ventricle: The cavity size was normal. Systolic function was   normal. The estimated ejection fraction was in the range of 60%   to 65%. Wall motion was normal; there were no regional wall   motion abnormalities. Left ventricular diastolic function   parameters were normal. - Tricuspid valve: There was trivial regurgitation.  Assessment:    1. Palpitations   2. PSVT (paroxysmal supraventricular tachycardia) ([place])   3. Hypertension, unspecified type   4. Tobacco use      Plan:   In  order of problems listed above:  1. Palpitations/ pSVT - recently seen in the office for evaluation of palpitations with an event monitor showing episodes of sinus tachycardia and an episode of pSVT with HR in the 170's.  - echo showed an EF of 60-65% with no regional WMA and labs showed normal electrolytes and thyroid function.  - symptoms have significantly improved following the addition of Lopressor (dosing recently increased from 25 mg BID to 50 mg BID by her PCP due to her elevated blood pressure readings).  - continue with Lopressor 50mg  BID as she denies any side-effects of lightheadedness, dizziness, presyncope, or fatigue with this recent adjustment. She was encouraged to limit her alcohol use in the setting of her arrhythmia.   2. HTN - BP well-controlled at 129/85 during today's visit.  - continue Lopressor 50mg  BID  3. Tobacco Use - she continues to smoke 0.5 ppd.  - cessation advised.     Medication Adjustments/Labs and Tests Ordered: Current medicines are reviewed at length with the patient today.  Concerns regarding  medicines are outlined above.  Medication changes, Labs and Tests ordered today are listed in the Patient Instructions below. Patient Instructions  Medication Instructions:  Your physician recommends that you continue on your current medications as directed. Please refer to the Current Medication list given to you today.  Labwork: None   Testing/Procedures: None   Follow-Up: Your physician wants you to follow-up in: 6 MONTHS WITH DR HILTY. You will receive a reminder letter in the mail two months in advance. If you don't receive a letter, please call our office to schedule the follow-up appointment.  Any Other Special Instructions Will Be Listed Below (If Applicable).  If you need a refill on your cardiac medications before your next appointment, please call your pharmacy.  Signed, Erma Heritage, PA  03/13/2017 7:25 PM    [facility] [address], [place], [place]  [address] Phone: [phone]; Fax: [phone]  [address] [place], [place] [address] Phone: [phone]

## 2017-03-13 ENCOUNTER — Ambulatory Visit (INDEPENDENT_AMBULATORY_CARE_PROVIDER_SITE_OTHER): Payer: 59 | Admitting: Student

## 2017-03-13 ENCOUNTER — Encounter: Payer: Self-pay | Admitting: Student

## 2017-03-13 DIAGNOSIS — I10 Essential (primary) hypertension: Secondary | ICD-10-CM | POA: Diagnosis not present

## 2017-03-13 DIAGNOSIS — Z720 Tobacco use: Secondary | ICD-10-CM | POA: Diagnosis not present

## 2017-03-13 DIAGNOSIS — I471 Supraventricular tachycardia, unspecified: Secondary | ICD-10-CM | POA: Insufficient documentation

## 2017-03-13 DIAGNOSIS — R002 Palpitations: Secondary | ICD-10-CM

## 2017-03-13 MED ORDER — METOPROLOL TARTRATE 50 MG PO TABS: 50.0000 mg | ORAL_TABLET | Freq: Two times a day (BID) | ORAL | 6 refills | Status: DC

## 2017-03-13 NOTE — Patient Instructions (Signed)
Medication Instructions:  Your physician recommends that you continue on your current medications as directed. Please refer to the Current Medication list given to you today.  Labwork: None   Testing/Procedures: None   Follow-Up: Your physician wants you to follow-up in: 6 MONTHS WITH DR HILTY. You will receive a reminder letter in the mail two months in advance. If you don't receive a letter, please call our office to schedule the follow-up appointment.  Any Other Special Instructions Will Be Listed Below (If Applicable).     If you need a refill on your cardiac medications before your next appointment, please call your pharmacy.

## 2017-03-16 ENCOUNTER — Telehealth: Payer: Self-pay | Admitting: Internal Medicine

## 2017-03-16 NOTE — Telephone Encounter (Signed)
Received FMLA ([employer]) Forms from [place] @ [employer] for Dr Debara Pickett to review, complete and sign.  Forms given to J. Arelia Sneddon RN for Dr Debara Pickett to review and sign. lp

## 2017-03-24 ENCOUNTER — Encounter: Payer: Self-pay | Admitting: Osteopathic Medicine

## 2017-03-24 ENCOUNTER — Telehealth: Payer: Self-pay | Admitting: Internal Medicine

## 2017-03-24 NOTE — Telephone Encounter (Signed)
Reviewed ER records. Was there via EMS for palpitations, d-dimer was elevated but CTA was negative for PE. Patient has since had follow-up with cardiology. I had ordered ultrasound for evaluation of elevated liver enzymes. She was still not gotten labs done for further evaluation of liver either.

## 2017-03-24 NOTE — Telephone Encounter (Signed)
Signed FMLA Forms received back from [place], [place] and faxed on 03/21/17 to [employer]. lp

## 2017-04-18 DIAGNOSIS — K760 Fatty (change of) liver, not elsewhere classified: Secondary | ICD-10-CM

## 2017-04-18 DIAGNOSIS — R911 Solitary pulmonary nodule: Secondary | ICD-10-CM

## 2017-04-18 DIAGNOSIS — R161 Splenomegaly, not elsewhere classified: Secondary | ICD-10-CM

## 2017-04-18 HISTORY — DX: Fatty (change of) liver, not elsewhere classified: K76.0

## 2017-04-18 HISTORY — DX: Splenomegaly, not elsewhere classified: R16.1

## 2017-04-18 HISTORY — DX: Solitary pulmonary nodule: R91.1

## 2017-04-21 ENCOUNTER — Ambulatory Visit (INDEPENDENT_AMBULATORY_CARE_PROVIDER_SITE_OTHER): Payer: 59

## 2017-04-21 ENCOUNTER — Encounter: Payer: Self-pay | Admitting: Sports Medicine

## 2017-04-21 ENCOUNTER — Ambulatory Visit (INDEPENDENT_AMBULATORY_CARE_PROVIDER_SITE_OTHER): Payer: 59 | Admitting: Sports Medicine

## 2017-04-21 DIAGNOSIS — J209 Acute bronchitis, unspecified: Secondary | ICD-10-CM | POA: Insufficient documentation

## 2017-04-21 DIAGNOSIS — R05 Cough: Secondary | ICD-10-CM

## 2017-04-21 DIAGNOSIS — R509 Fever, unspecified: Secondary | ICD-10-CM | POA: Diagnosis not present

## 2017-04-21 LAB — POCT RAPID STREP A (OFFICE): Rapid Strep A Screen: NEGATIVE

## 2017-04-21 MED ORDER — MELOXICAM 15 MG PO TABS: ORAL_TABLET | ORAL | 3 refills | Status: DC

## 2017-04-21 MED ORDER — AZITHROMYCIN 250 MG PO TABS: ORAL_TABLET | ORAL | 0 refills | Status: DC

## 2017-04-21 NOTE — Assessment & Plan Note (Signed)
With severe symptoms including fevers, malaise. Symptoms present for 4 days, history of pneumonia with similar symptoms, adding a chest x-ray, azithromycin, meloxicam, out of work for 3 days.

## 2017-04-21 NOTE — Progress Notes (Signed)
  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 Symptoms are moderate, persistent. She did have a pneumonia and was on isolation for some time several years ago with similar symptoms. No headaches, visual changes, no chest pain.  Past medical history:  Negative.  See flowsheet/record as well for more information.  Surgical history: Negative.  See flowsheet/record as well for more information.  Family history: Negative.  See flowsheet/record as well for more information.  Social history: Negative.  See flowsheet/record as well for more information.  Allergies, and medications have been entered into the medical record, reviewed, and no changes needed.   Review of Systems: No fevers, chills, night sweats, weight loss, chest pain, or shortness of breath.   Objective:    General: Well Developed, well nourished, and in no acute distress.  Neuro: Alert and oriented x3, extra-ocular muscles intact, sensation grossly intact.  HEENT: Normocephalic, atraumatic, pupils equal round reactive to light, neck supple, no masses, no lymphadenopathy, thyroid nonpalpable.  Oropharynx, nasopharynx, ear canals unremarkable Skin: Warm and dry, no rashes. Cardiac: Regular rate and rhythm, no murmurs rubs or gallops, no lower extremity edema.  Respiratory: Clear to auscultation bilaterally. Not using accessory muscles, speaking in full sentences.  Rapid strep test is negative  Impression and Recommendations:    Acute bronchitis With severe symptoms including fevers, malaise. Symptoms present for 4 days, history of pneumonia with similar symptoms, adding a chest x-ray, azithromycin, meloxicam, out of work for 3 days.

## 2017-04-23 ENCOUNTER — Encounter: Payer: Self-pay | Admitting: Sports Medicine

## 2017-04-27 ENCOUNTER — Ambulatory Visit (INDEPENDENT_AMBULATORY_CARE_PROVIDER_SITE_OTHER): Payer: 59 | Admitting: Family Medicine

## 2017-04-27 ENCOUNTER — Encounter: Payer: Self-pay | Admitting: Family Medicine

## 2017-04-27 ENCOUNTER — Ambulatory Visit (INDEPENDENT_AMBULATORY_CARE_PROVIDER_SITE_OTHER): Payer: 59

## 2017-04-27 VITALS — BP 125/86 | HR 114 | Temp 101.2°F | Wt 186.0 lb

## 2017-04-27 DIAGNOSIS — R0902 Hypoxemia: Secondary | ICD-10-CM | POA: Diagnosis not present

## 2017-04-27 DIAGNOSIS — R509 Fever, unspecified: Secondary | ICD-10-CM

## 2017-04-27 LAB — POCT URINALYSIS DIPSTICK
Bilirubin, UA: NEGATIVE
Blood, UA: NEGATIVE
Glucose, UA: NEGATIVE
Ketones, UA: NEGATIVE
NITRITE UA: NEGATIVE
PH UA: 5.5 (ref 5.0–8.0)
PROTEIN UA: NEGATIVE
Spec Grav, UA: 1.02 (ref 1.010–1.025)
Urobilinogen, UA: 0.2 E.U./dL

## 2017-04-27 LAB — POCT INFLUENZA A/B
INFLUENZA B, POC: NEGATIVE
Influenza A, POC: NEGATIVE

## 2017-04-27 MED ORDER — DOXYCYCLINE HYCLATE 100 MG PO TABS: 100.0000 mg | ORAL_TABLET | Freq: Two times a day (BID) | ORAL | 0 refills | Status: DC

## 2017-04-27 NOTE — Patient Instructions (Signed)
Thank you for coming in today. Get labs today.  Take doxycycline twice daily.  Return for recheck if not better.  Call or go to the emergency room if you get worse, have trouble breathing, have chest pains, or palpitations.   Fever, Adult A fever is an increase in the body's temperature. It is usually defined as a temperature of 100F (38C) or higher. Brief mild or moderate fevers generally have no long-term effects, and they often do not require treatment. Moderate or high fevers may make you feel uncomfortable and can sometimes be a sign of a serious illness or disease. The sweating that may occur with repeated or prolonged fever may also cause dehydration. Fever is confirmed by taking a temperature with a thermometer. A measured temperature can vary with:  Age.  Time of day.  Location of the thermometer:  Mouth (oral).  Rectum (rectal).  Ear (tympanic).  Underarm (axillary).  Forehead (temporal). Follow these instructions at home: Pay attention to any changes in your symptoms. Take these actions to help with your condition:  Take over-the counter and prescription medicines only as told by your health care provider. Follow the dosing instructions carefully.  If you were prescribed an antibiotic medicine, take it as told by your health care provider. Do not stop taking the antibiotic even if you start to feel better.  Rest as needed.  Drink enough fluid to keep your urine clear or pale yellow. This helps to prevent dehydration.  Sponge yourself or bathe with room-temperature water to help reduce your body temperature as needed. Do not use ice water.  Do not overbundle yourself in blankets or heavy clothes. Contact a health care provider if:  You vomit.  You cannot eat or drink without vomiting.  You have diarrhea.  You have pain when you urinate.  Your symptoms do not improve with treatment.  You develop new symptoms.  You develop excessive weakness. Get help  right away if:  You have shortness of breath or have trouble breathing.  You are dizzy or you faint.  You are disoriented or confused.  You develop signs of dehydration, such as a dry mouth, decreased urination, or paleness.  You develop severe pain in your abdomen.  You have persistent vomiting or diarrhea.  You develop a skin rash.  Your symptoms suddenly get worse. This information is not intended to replace advice given to you by your health care provider. Make sure you discuss any questions you have with your health care provider. Document Released: 05/31/2001 Document Revised: 05/12/2016 Document Reviewed: 01/29/2015 Elsevier Interactive Patient Education  2017 [employer].

## 2017-04-27 NOTE — Progress Notes (Signed)
Deborah Goodman is a 41 y.o. female who presents to [facility] today for fever. Patient is a 9day history of persistent fever. This is not associated with rash, cough, vomiting or diarrhea. She denies any urinary frequency urgency or dysuria. She denies any trouble breathing. She denies tick bites. She notes a mild headache intermittently. She was seen by my partner on May 4 or chest x-ray was unremarkable as well as strep test. She was treated empirically with azithromycin. She denies any new medications. She's been taking ibuprofen for fever control which helps. She denies any foreign travel.   Past Medical History:  Diagnosis Date  . Acid reflux   . Depression   . GAD (generalized anxiety disorder) 02/10/2017  . Hypertension 02/10/2017  . Palpitations 02/10/2017   a. 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   . Systolic murmur   . Tobacco use    Past Surgical History:  Procedure Laterality Date  . SACRO-ILIAC PINNING  2016   fusion, in [place] History  Substance Use Topics  . Smoking status: Current Every Day Smoker    Packs/day: 0.50    Years: 22.00    Types: Cigarettes  . Smokeless tobacco: Never Used  . Alcohol use No   family history includes Alcohol abuse in her father; Arrhythmia in her sister; Cancer in her maternal grandfather; Depression in her mother; Diabetes in her father and paternal grandfather; Hypertension in her mother.  ROS as above:  Medications: Current Outpatient Prescriptions  Medication Sig Dispense Refill  . azithromycin (ZITHROMAX Z-PAK) 250 MG tablet Take 2 tablets (500 mg) on  Day 1,  followed by 1 tablet (250 mg) once daily on Days 2 through 5. 6 tablet 0  . B Complex-C (B-COMPLEX WITH VITAMIN C) tablet Take 1 tablet by mouth daily.    . cholecalciferol (VITAMIN D) 1000 units tablet Take 1,000  Units by mouth daily.    Marland Kitchen desvenlafaxine (PRISTIQ) 50 MG 24 hr tablet Take 1 tablet (50 mg total) by mouth daily. 90 tablet 3  . doxycycline (VIBRA-TABS) 100 MG tablet Take 1 tablet (100 mg total) by mouth 2 (two) times daily. 14 tablet 0  . meloxicam (MOBIC) 15 MG tablet One tab PO qAM with breakfast for 2 weeks, then daily prn pain. 30 tablet 3  . metoprolol (LOPRESSOR) 50 MG tablet Take 1 tablet (50 mg total) by mouth 2 (two) times daily. 60 tablet 6  . milk thistle 175 MG tablet Take 175 mg by mouth daily.    . Multiple Vitamin (MULTIVITAMIN WITH MINERALS) TABS tablet Take 1 tablet by mouth daily.    . Omega-3 Fatty Acids (FISH OIL PO) Take 1 capsule by mouth daily.     No current facility-administered medications for this visit.    Allergies  Allergen Reactions  . Fish Allergy Nausea And Vomiting    ALL SEAFOOD  . Shellfish Allergy Nausea And Vomiting    ALL SEAFOOD  . Sulfa Antibiotics Other (See Comments)    Thrush reaction    Health Maintenance Health Maintenance  Topic Date Due  . HEMOGLOBIN A1C  01[birth year]1977  . PNEUMOCOCCAL POLYSACCHARIDE VACCINE (1) 06/13/1979  . FOOT EXAM  06/13/1987  . OPHTHALMOLOGY EXAM  06/13/1987  . URINE MICROALBUMIN  06/13/1987  . HIV Screening  06/12/1992  . TETANUS/TDAP  06/12/1996  . PAP SMEAR  06/12/1998  . INFLUENZA VACCINE  07/19/2017  Exam:  BP 125/86   Pulse (!) 114   Temp (!) 101.2 F (38.4 C) (Oral)   Wt 186 lb (84.4 kg)   LMP 04/18/2017   SpO2 100%   BMI 30.02 kg/m  Gen: Well NAD HEENT: EOMI,  MMM Normal posterior pharynx and tympanic membranes Lungs: Normal work of breathing. CTABL Heart: RRR no MRG Heart rate 84 bpm per my check Abd: NABS, Soft. Nondistended, Nontender Exts: Brisk capillary refill, warm and well perfused.  Skin no rash   Results for orders placed or performed in visit on 04/27/17 (from the past 72 hour(s))  POCT Influenza A/B     Status: Normal   Collection Time: 04/27/17  1:41 PM  Result  Value Ref Range   Influenza A, POC Negative Negative   Influenza B, POC Negative Negative  Urinalysis Dipstick     Status: Abnormal   Collection Time: 04/27/17  1:55 PM  Result Value Ref Range   Color, UA yellow    Clarity, UA clear    Glucose, UA negative    Bilirubin, UA negative    Ketones, UA negative    Spec Grav, UA 1.020 1.010 - 1.025   Blood, UA negative    pH, UA 5.5 5.0 - 8.0   Protein, UA negative    Urobilinogen, UA 0.2 0.2 or 1.0 E.U./dL   Nitrite, UA negative    Leukocytes, UA Small (1+) (A) Negative   Dg Chest 2 View  Result Date: 04/27/2017 CLINICAL DATA:  Fever and hypoxia EXAM: CHEST  2 VIEW COMPARISON:  04/21/2017 FINDINGS: Normal heart size and mediastinal contours. Borderline central airway thickening. No acute infiltrate or edema. No effusion or pneumothorax. No acute osseous findings. IMPRESSION: Negative for pneumonia. Electronically Signed   By: Monte Fantasia M.D.   On: 04/27/2017 14:46      Assessment and Plan: 41 y.o. female with persistent fever unclear etiology. Plan for broad workup including tickborne illness. Chest x-ray appears unremarkable. Awaiting formal radiology review. Additionally workup will include a rheumatologic workup. Empiric treatment with doxycycline for potential tickborne illness.   Orders Placed This Encounter  Procedures  . Urine Culture  . DG Chest 2 View    Order Specific Question:   Reason for exam:    Answer:   fever decreased o2 sat    Order Specific Question:   Is the patient pregnant?    Answer:   No    Order Specific Question:   Preferred imaging location?    Answer:   [facility]  . CBC with Differential/Platelet  . COMPLETE METABOLIC PANEL WITH GFR  . Sedimentation rate  . Rocky mtn spotted fvr abs pnl(IgG+IgM)  . Rheumatoid factor  . B. burgdorfi antibodies  . CK  . ANA  . POCT Influenza A/B  . Urinalysis Dipstick   Meds ordered this encounter  Medications  . doxycycline (VIBRA-TABS) 100  MG tablet    Sig: Take 1 tablet (100 mg total) by mouth 2 (two) times daily.    Dispense:  14 tablet    Refill:  0     Discussed warning signs or symptoms. Please see discharge instructions. Patient expresses understanding.  I spent 40 minutes with this patient, greater than 50% was face-to-face time counseling regarding the above diagnosis.

## 2017-04-28 LAB — CBC WITH DIFFERENTIAL/PLATELET
BASOS PCT: 2 %
Basophils Absolute: 160 cells/uL (ref 0–200)
EOS ABS: 160 {cells}/uL (ref 15–500)
Eosinophils Relative: 2 %
HEMATOCRIT: 42.5 % (ref 35.0–45.0)
HEMOGLOBIN: 14.2 g/dL (ref 11.7–15.5)
Lymphocytes Relative: 36 %
Lymphs Abs: 2880 cells/uL (ref 850–3900)
MCH: 30.7 pg (ref 27.0–33.0)
MCHC: 33.4 g/dL (ref 32.0–36.0)
MCV: 92 fL (ref 80.0–100.0)
MPV: 9.8 fL (ref 7.5–12.5)
Monocytes Absolute: 640 cells/uL (ref 200–950)
Monocytes Relative: 8 %
Neutro Abs: 4160 cells/uL (ref 1500–7800)
Neutrophils Relative %: 52 %
Platelets: 237 10*3/uL (ref 140–400)
RBC: 4.62 MIL/uL (ref 3.80–5.10)
RDW: 13.6 % (ref 11.0–15.0)
WBC: 8 10*3/uL (ref 3.8–10.8)

## 2017-04-28 LAB — COMPLETE METABOLIC PANEL WITH GFR
ALK PHOS: 105 U/L (ref 33–115)
ALT: 117 U/L — AB (ref 6–29)
AST: 109 U/L — AB (ref 10–30)
Albumin: 3.5 g/dL — ABNORMAL LOW (ref 3.6–5.1)
BUN: 8 mg/dL (ref 7–25)
CALCIUM: 8.9 mg/dL (ref 8.6–10.2)
CHLORIDE: 97 mmol/L — AB (ref 98–110)
CO2: 26 mmol/L (ref 20–31)
Creat: 0.7 mg/dL (ref 0.50–1.10)
GFR, Est Non African American: >89 mL/min (ref >=60)
GLUCOSE: 92 mg/dL (ref 65–99)
Potassium: 4.6 mmol/L (ref 3.5–5.3)
Sodium: 134 mmol/L — ABNORMAL LOW (ref 135–146)
TOTAL PROTEIN: 6.8 g/dL (ref 6.1–8.1)
Total Bilirubin: 0.8 mg/dL (ref 0.2–1.2)

## 2017-04-28 LAB — ANA: Anti Nuclear Antibody(ANA): POSITIVE — AB

## 2017-04-28 LAB — RHEUMATOID FACTOR: Rhuematoid fact SerPl-aCnc: <14 IU/mL (ref <14)

## 2017-04-28 LAB — CK: Total CK: 55 U/L (ref 7–177)

## 2017-04-28 LAB — SEDIMENTATION RATE: Sed Rate: 11 mm/hr (ref 0–20)

## 2017-04-28 LAB — ANTI-NUCLEAR AB-TITER (ANA TITER): ANA Titer 1: 1:40 {titer} — ABNORMAL HIGH

## 2017-04-28 LAB — LYME AB/WESTERN BLOT REFLEX: B burgdorferi Ab IgG+IgM: <0.9 Index (ref <0.90)

## 2017-04-29 LAB — URINE CULTURE

## 2017-05-01 ENCOUNTER — Encounter: Payer: Self-pay | Admitting: Family Medicine

## 2017-05-01 LAB — ROCKY MTN SPOTTED FVR ABS PNL(IGG+IGM)
RMSF IGM: NOT DETECTED
RMSF IgG: NOT DETECTED

## 2017-05-02 ENCOUNTER — Encounter: Payer: Self-pay | Admitting: Family Medicine

## 2017-05-05 ENCOUNTER — Ambulatory Visit (INDEPENDENT_AMBULATORY_CARE_PROVIDER_SITE_OTHER): Payer: 59 | Admitting: Family Medicine

## 2017-05-05 ENCOUNTER — Encounter: Payer: Self-pay | Admitting: Family Medicine

## 2017-05-05 VITALS — BP 113/74 | HR 80 | Wt 187.0 lb

## 2017-05-05 DIAGNOSIS — IMO0001 Reserved for inherently not codable concepts without codable children: Secondary | ICD-10-CM | POA: Insufficient documentation

## 2017-05-05 DIAGNOSIS — R911 Solitary pulmonary nodule: Secondary | ICD-10-CM

## 2017-05-05 DIAGNOSIS — R740 Nonspecific elevation of levels of transaminase and lactic acid dehydrogenase [LDH]: Secondary | ICD-10-CM | POA: Diagnosis not present

## 2017-05-05 DIAGNOSIS — R509 Fever, unspecified: Secondary | ICD-10-CM

## 2017-05-05 DIAGNOSIS — R7401 Elevation of levels of liver transaminase levels: Secondary | ICD-10-CM | POA: Insufficient documentation

## 2017-05-05 NOTE — Patient Instructions (Signed)
Thank you for coming in today.  We are getting more labs to look in more detail about your liver, llung nodule and fever.  We are also doing an ultrasound of your liver and a CT scan of your lungs.  We should be able to do these tests soon.  Lets recheck in 2 weeks to go over all the details.   Call or go to the emergency room if you get worse, have trouble breathing, have chest pains, or palpitations.

## 2017-05-05 NOTE — Progress Notes (Signed)
Deborah Goodman is a 41 y.o. female who presents to [facility] today for follow-up fever.  Patient was seen last week for fever of unknown origin. She had an extensive workup that was only significantly elevated for mild transaminitis as well as mildly positive ANA. She does note a mildly positive family history of rheumatologic diseases. Additionally she notes the transaminitis has occurred in the past. Additionally she notes in February she had a CT angiogram of her chest that showed an incidental small less than 5 mm nodule that is due for recheck now. She notes that she's had reduction in her overall fever but still is having nighttime fevers and night sweats. No vomiting or diarrhea chest pain or palpitations. No abdominal pain.   Past Medical History:  Diagnosis Date  . Acid reflux   . Depression   . GAD (generalized anxiety disorder) 02/10/2017  . Hypertension 02/10/2017  . Palpitations 02/10/2017   a. 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   . Systolic murmur   . Tobacco use    Past Surgical History:  Procedure Laterality Date  . SACRO-ILIAC PINNING  2016   fusion, in [place] History  Substance Use Topics  . Smoking status: Current Every Day Smoker    Packs/day: 0.50    Years: 22.00    Types: Cigarettes  . Smokeless tobacco: Never Used  . Alcohol use No   family history includes Alcohol abuse in her father; Arrhythmia in her sister; Cancer in her maternal grandfather; Depression in her mother; Diabetes in her father and paternal grandfather; Hypertension in her mother.  ROS as above:  Medications: Current Outpatient Prescriptions  Medication Sig Dispense Refill  . azithromycin (ZITHROMAX Z-PAK) 250 MG tablet Take 2 tablets (500 mg) on  Day 1,  followed by 1 tablet (250 mg) once daily on Days 2 through 5. 6 tablet 0    . B Complex-C (B-COMPLEX WITH VITAMIN C) tablet Take 1 tablet by mouth daily.    . cholecalciferol (VITAMIN D) 1000 units tablet Take 1,000 Units by mouth daily.    Marland Kitchen desvenlafaxine (PRISTIQ) 50 MG 24 hr tablet Take 1 tablet (50 mg total) by mouth daily. 90 tablet 3  . doxycycline (VIBRA-TABS) 100 MG tablet Take 1 tablet (100 mg total) by mouth 2 (two) times daily. 14 tablet 0  . meloxicam (MOBIC) 15 MG tablet One tab PO qAM with breakfast for 2 weeks, then daily prn pain. 30 tablet 3  . metoprolol (LOPRESSOR) 50 MG tablet Take 1 tablet (50 mg total) by mouth 2 (two) times daily. 60 tablet 6  . milk thistle 175 MG tablet Take 175 mg by mouth daily.    . Multiple Vitamin (MULTIVITAMIN WITH MINERALS) TABS tablet Take 1 tablet by mouth daily.    . Omega-3 Fatty Acids (FISH OIL PO) Take 1 capsule by mouth daily.     No current facility-administered medications for this visit.    Allergies  Allergen Reactions  . Fish Allergy Nausea And Vomiting    ALL SEAFOOD  . Shellfish Allergy Nausea And Vomiting    ALL SEAFOOD  . Sulfa Antibiotics Other (See Comments)    Thrush reaction    Health Maintenance Health Maintenance  Topic Date Due  . HEMOGLOBIN A1C  08-04-1977  . PNEUMOCOCCAL POLYSACCHARIDE VACCINE (1) 06/13/1979  . FOOT EXAM  06/13/1987  . OPHTHALMOLOGY EXAM  06/13/1987  .  URINE MICROALBUMIN  06/13/1987  . HIV Screening  06/12/1992  . TETANUS/TDAP  06/12/1996  . PAP SMEAR  06/12/1998  . INFLUENZA VACCINE  07/19/2017     Exam:  BP 113/74   Pulse 80   Wt 187 lb (84.8 kg)   LMP 04/18/2017   BMI 30.18 kg/m  Gen: Well NAD HEENT: EOMI,  MMM Lungs: Normal work of breathing. CTABL Heart: RRR no MRG Abd: NABS, Soft. Nondistended, Nontender Exts: Brisk capillary refill, warm and well perfused.  No rash    Chemistry      Component Value Date/Time   NA 134 (L) 04/27/2017 1423   K 4.6 04/27/2017 1423   CL 97 (L) 04/27/2017 1423   CO2 26 04/27/2017 1423   BUN 8 04/27/2017  1423   CREATININE 0.70 04/27/2017 1423      Component Value Date/Time   CALCIUM 8.9 04/27/2017 1423   ALKPHOS 105 04/27/2017 1423   AST 109 (H) 04/27/2017 1423   ALT 117 (H) 04/27/2017 1423   BILITOT 0.8 04/27/2017 1423     Lab Results  Component Value Date   WBC 8.0 04/27/2017   HGB 14.2 04/27/2017   HCT 42.5 04/27/2017   MCV 92.0 04/27/2017   PLT 237 04/27/2017   Lab Results  Component Value Date   ANA POS (A) 04/27/2017      No results found for this or any previous visit (from the past 72 hour(s)). No results found.    Assessment and Plan: 41 y.o. female with  Fever of unknown origin with transaminitis and small lung nodule.  Plan for further workup to narrow down on potential lupus etiology as well as further rheumatologic causes. Additionally will reevaluate transaminitis with EBV cytomegalovirus and hepatitis panels. Additionally we'll check an ultrasound of the liver for evaluation of what I suspect is probably nonalcoholic steatohepatitis.  Lastly Will follow-up the lung mass with a repeat CT scan of the chest.  Recheck in 2 weeks or so.    Orders Placed This Encounter  Procedures  . US Abdomen Complete    Standing Status:   Future    Standing Expiration Date:   07/05/2018    Order Specific Question:   Reason for Exam (SYMPTOM  OR DIAGNOSIS REQUIRED)    Answer:   eval transaminitis    Order Specific Question:   Preferred imaging location?    Answer:   [facility]  . CT CHEST NODULE FOLLOW UP LOW DOSE W/O    Standing Status:   Future    Standing Expiration Date:   07/05/2018    Order Specific Question:   Reason for Exam (SYMPTOM  OR DIAGNOSIS REQUIRED)    Answer:   [facility] nodule [facility] needs follow up    Order Specific Question:   Is patient pregnant?    Answer:   No    Order Specific Question:   Preferred imaging location?    Answer:   [facility]    Order Specific Question:   Radiology Contrast Protocol - do  NOT remove file path    Answer:   [email or web address]  . Epstein-Barr virus VCA antibody panel  . CMV IgM  . Cytomegalovirus antibody, IgG  . ANA  . Sedimentation rate  . CBC with Differential/Platelet  . Comprehensive metabolic panel  . Angiotensin converting enzyme  . Gamma GT  . Anti-Smith antibody  . Sjogrens syndrome-A extractable nuclear antibody  . Hepatitis panel, acute   No orders of the  defined types were placed in this encounter.    Discussed warning signs or symptoms. Please see discharge instructions. Patient expresses understanding.

## 2017-05-06 LAB — HEPATITIS PANEL, ACUTE
HCV Ab: REACTIVE — AB
HEP B C IGM: NONREACTIVE
Hep A IgM: NONREACTIVE
Hepatitis B Surface Ag: NEGATIVE

## 2017-05-06 LAB — COMPREHENSIVE METABOLIC PANEL
ALBUMIN: 2.8 g/dL — AB (ref 3.6–5.1)
ALT: 113 U/L — ABNORMAL HIGH (ref 6–29)
AST: 124 U/L — ABNORMAL HIGH (ref 10–30)
Alkaline Phosphatase: 189 U/L — ABNORMAL HIGH (ref 33–115)
BUN: 8 mg/dL (ref 7–25)
CALCIUM: 8 mg/dL — AB (ref 8.6–10.2)
CHLORIDE: 102 mmol/L (ref 98–110)
CO2: 24 mmol/L (ref 20–31)
Creat: 0.7 mg/dL (ref 0.50–1.10)
GLUCOSE: 88 mg/dL (ref 65–99)
POTASSIUM: 3.7 mmol/L (ref 3.5–5.3)
Sodium: 137 mmol/L (ref 135–146)
Total Bilirubin: 0.9 mg/dL (ref 0.2–1.2)
Total Protein: 6.1 g/dL (ref 6.1–8.1)

## 2017-05-06 LAB — CBC WITH DIFFERENTIAL/PLATELET
BASOS ABS: 405 {cells}/uL — AB (ref 0–200)
Basophils Relative: 3 %
EOS ABS: 135 {cells}/uL (ref 15–500)
Eosinophils Relative: 1 %
HEMATOCRIT: 41.2 % (ref 35.0–45.0)
Hemoglobin: 13.8 g/dL (ref 11.7–15.5)
LYMPHS PCT: 60 %
Lymphs Abs: 8100 cells/uL — ABNORMAL HIGH (ref 850–3900)
MCH: 30.8 pg (ref 27.0–33.0)
MCHC: 33.5 g/dL (ref 32.0–36.0)
MCV: 92 fL (ref 80.0–100.0)
MONO ABS: 1350 {cells}/uL — AB (ref 200–950)
MPV: 9.7 fL (ref 7.5–12.5)
Monocytes Relative: 10 %
NEUTROS PCT: 26 %
Neutro Abs: 3510 cells/uL (ref 1500–7800)
Platelets: 303 10*3/uL (ref 140–400)
RBC: 4.48 MIL/uL (ref 3.80–5.10)
RDW: 14.1 % (ref 11.0–15.0)
WBC: 13.5 10*3/uL — AB (ref 3.8–10.8)

## 2017-05-06 LAB — GAMMA GT: GGT: 452 U/L — AB (ref 3–50)

## 2017-05-06 LAB — SEDIMENTATION RATE: Sed Rate: 12 mm/hr (ref 0–20)

## 2017-05-08 ENCOUNTER — Encounter: Payer: Self-pay | Admitting: Family Medicine

## 2017-05-08 LAB — EPSTEIN-BARR VIRUS VCA ANTIBODY PANEL
EBV NA IGG: 294 U/mL — AB
EBV VCA IgG: >750 U/mL — ABNORMAL HIGH
EBV VCA IgM: 49.2 U/mL — ABNORMAL HIGH

## 2017-05-08 LAB — HEPATITIS C RNA QUANTITATIVE
HCV QUANT LOG: NOT DETECTED {Log_IU}/mL
HCV Quantitative: <15 IU/mL

## 2017-05-08 LAB — ANTI-SMITH ANTIBODY: ENA SM AB SER-ACNC: NEGATIVE

## 2017-05-08 LAB — SJOGRENS SYNDROME-A EXTRACTABLE NUCLEAR ANTIBODY: SSA (RO) (ENA) ANTIBODY, IGG: NEGATIVE

## 2017-05-08 LAB — PATHOLOGIST SMEAR REVIEW

## 2017-05-08 LAB — ANGIOTENSIN CONVERTING ENZYME: Angiotensin-Converting Enzyme: 204 U/L — ABNORMAL HIGH (ref 9–67)

## 2017-05-09 ENCOUNTER — Ambulatory Visit (INDEPENDENT_AMBULATORY_CARE_PROVIDER_SITE_OTHER): Payer: 59

## 2017-05-09 DIAGNOSIS — R740 Nonspecific elevation of levels of transaminase and lactic acid dehydrogenase [LDH]: Secondary | ICD-10-CM

## 2017-05-09 DIAGNOSIS — K760 Fatty (change of) liver, not elsewhere classified: Secondary | ICD-10-CM

## 2017-05-09 DIAGNOSIS — R509 Fever, unspecified: Secondary | ICD-10-CM

## 2017-05-09 DIAGNOSIS — R911 Solitary pulmonary nodule: Secondary | ICD-10-CM

## 2017-05-09 DIAGNOSIS — R161 Splenomegaly, not elsewhere classified: Secondary | ICD-10-CM

## 2017-05-09 DIAGNOSIS — R7401 Elevation of levels of liver transaminase levels: Secondary | ICD-10-CM

## 2017-05-09 DIAGNOSIS — IMO0001 Reserved for inherently not codable concepts without codable children: Secondary | ICD-10-CM

## 2017-05-09 DIAGNOSIS — J84115 Respiratory bronchiolitis interstitial lung disease: Secondary | ICD-10-CM | POA: Diagnosis not present

## 2017-05-09 LAB — ANTI-NUCLEAR AB-TITER (ANA TITER)

## 2017-05-09 LAB — CMV IGM: CMV IgM: 147 AU/mL — ABNORMAL HIGH (ref <30.00)

## 2017-05-09 LAB — CYTOMEGALOVIRUS ANTIBODY, IGG: CYTOMEGALOVIRUS AB-IGG: 8.9 U/mL — AB (ref <0.60)

## 2017-05-09 LAB — ANA: ANA: POSITIVE — AB

## 2017-05-10 ENCOUNTER — Encounter: Payer: Self-pay | Admitting: Family Medicine

## 2017-05-15 ENCOUNTER — Encounter: Payer: Self-pay | Admitting: Family Medicine

## 2017-06-20 MED FILL — AZITHROMYCIN 250 MG TABLET: 250 | 5 days supply | Qty: 6 | Fill #0

## 2017-06-22 MED FILL — DESVENLAFAXINE SUC ER 50 MG: 50 | 30 days supply | Qty: 30 | Fill #1

## 2017-08-08 ENCOUNTER — Encounter: Payer: Self-pay | Admitting: Osteopathic Medicine

## 2017-08-08 ENCOUNTER — Ambulatory Visit (INDEPENDENT_AMBULATORY_CARE_PROVIDER_SITE_OTHER): Payer: 59

## 2017-08-08 ENCOUNTER — Ambulatory Visit (INDEPENDENT_AMBULATORY_CARE_PROVIDER_SITE_OTHER): Payer: 59 | Admitting: Osteopathic Medicine

## 2017-08-08 VITALS — BP 130/82 | HR 85 | Temp 98.6°F | Wt 184.0 lb

## 2017-08-08 DIAGNOSIS — R059 Cough, unspecified: Secondary | ICD-10-CM

## 2017-08-08 DIAGNOSIS — R05 Cough: Secondary | ICD-10-CM

## 2017-08-08 DIAGNOSIS — J069 Acute upper respiratory infection, unspecified: Secondary | ICD-10-CM | POA: Diagnosis not present

## 2017-08-08 DIAGNOSIS — B9789 Other viral agents as the cause of diseases classified elsewhere: Secondary | ICD-10-CM | POA: Diagnosis not present

## 2017-08-08 DIAGNOSIS — R062 Wheezing: Secondary | ICD-10-CM | POA: Diagnosis not present

## 2017-08-08 DIAGNOSIS — R0602 Shortness of breath: Secondary | ICD-10-CM | POA: Diagnosis not present

## 2017-08-08 MED ORDER — ALBUTEROL SULFATE HFA 108 (90 BASE) MCG/ACT IN AERS: 2.0000 | INHALATION_SPRAY | Freq: Four times a day (QID) | RESPIRATORY_TRACT | 11 refills | Status: DC | PRN

## 2017-08-08 MED ORDER — GUAIFENESIN-CODEINE 100-10 MG/5ML PO SYRP: 5.0000 mL | ORAL_SOLUTION | Freq: Four times a day (QID) | ORAL | 0 refills | Status: DC | PRN

## 2017-08-08 MED ORDER — IPRATROPIUM BROMIDE 0.06 % NA SOLN: 2.0000 | Freq: Four times a day (QID) | NASAL | 12 refills | Status: DC

## 2017-08-08 MED ORDER — PREDNISONE 20 MG PO TABS: 20.0000 mg | ORAL_TABLET | Freq: Two times a day (BID) | ORAL | 0 refills | Status: DC

## 2017-08-08 NOTE — Patient Instructions (Addendum)
Plan:  Xray looks okay, I think we are dealing with a viral infection   Continue Day-Quil/Ny-Quil with Ibuprofen as needed  Adding steroids, inhaler as needed, cough syrup as needed, and nasal spray   If still sick at the end of this week or beginning of next week, call us! 0

## 2017-08-08 NOTE — Progress Notes (Signed)
HPI: Deborah Goodman is a 41 y.o. female who presents to [facility] 08/08/17 for chief complaint of:  Chief Complaint  Patient presents with  . Sinusitis  . Cough    Acute Illness: . Context: (+)sick contacts at work with similar sypmtoms . Location/Quality: chest congestion at night, coughing, sinus congestion, sore throat . Assoc signs/symptoms: see ROS . Duration: 3 days . Modifying factors: has tried the following OTC/Rx medications: DayQuil, NyQuil, Ibuprofen    Past medical, social and family history reviewed.  Immune compromising conditions or other risk factors: tobacco use  Social History   Social History  . Marital status: Single    Spouse name: N/A  . Number of children: N/A  . Years of education: N/A   Occupational History  . 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 Smoking status: Current Every Day Smoker    Packs/day: 0.50    Years: 22.00    Types: Cigarettes  . Smokeless tobacco: Never Used  . Alcohol use No  . Drug use: No  . Sexual activity: Not on file   Other Topics Concern  . Not on file   Social History Narrative  . No narrative on file    Current medications and allergies reviewed.     Review of Systems:  Constitutional: intermittent subjective fever/chills  HEENT: sinus headache, No  sore throat, No  swollen glands  Cardiovascular: No chest pain  Respiratory:Yes  cough, some at night shortness of breath  Gastrointestinal: No  nausea, No  vomiting,  No  diarrhea  Musculoskeletal:   No  myalgia/arthralgia  Skin/Integument:  No  rash   Detailed Exam:  BP 130/82   Pulse 85   Temp 98.6 F (37 C) (Oral)   Wt 184 lb (83.5 kg)   SpO2 100%   BMI 29.70 kg/m   Constitutional:   VSS, see above.   General Appearance: alert, well-developed, well-nourished, NAD  Eyes:   Normal lids and conjunctive, non-icteric sclera  Ears, Nose, Mouth, Throat:   Normal external  inspection ears/nares  Normal mouth/lips/gums, MMM  normal TM  posterior pharynx without erythema, without exudate  nasal mucosa normal  Skin:  Normal inspection, no rash or concerning lesions noted on limited exam  Neck:   No masses, trachea midline. normal lymph nodes  Respiratory:   Normal respiratory effort.   No wheeze/rhonchi/rales  Cardiovascular:   S1/S2 normal, no murmur/rub/gallop auscultated. RRR.   CXR on personal review Cardiomediastinal silhouette/heart size: normal Obvious bony abnormality: none Infiltrate: none Mass or other opacity: none Atelectasis: none Diaphragms: normal Lateral view: normal Images were reviewed with the patient. Pt counseled that radiologist will review the images as well, our office will call if the formal read reveals any significant findings other than what has been noted above.     ASSESSMENT/PLAN:  Viral URI with cough - Plan: albuterol (PROVENTIL HFA;VENTOLIN HFA) 108 (90 Base) MCG/ACT inhaler, predniSONE (DELTASONE) 20 MG tablet, ipratropium (ATROVENT) 0.06 % nasal spray, guaiFENesin-codeine (ROBITUSSIN AC) 100-10 MG/5ML syrup  Cough - Plan: DG Chest 2 View, albuterol (PROVENTIL HFA;VENTOLIN HFA) 108 (90 Base) MCG/ACT inhaler, guaiFENesin-codeine (ROBITUSSIN AC) 100-10 MG/5ML syrup     Patient Instructions  Plan:  Xray looks okay, I think we are dealing with a viral infection   Continue Day-Quil/Ny-Quil with Ibuprofen as needed  Adding steroids, inhaler as needed, cough syrup as needed, and nasal spray   If still sick at the end of this  week or beginning of next week, call us! 0    Visit summary was printed for the patient with medications and pertinent instructions for patient to review. ER/RTC precautions reviewed. All questions answered. Return if symptoms worsen or fail to improve.

## 2017-10-11 DIAGNOSIS — R945 Abnormal results of liver function studies: Secondary | ICD-10-CM | POA: Diagnosis not present

## 2017-10-11 DIAGNOSIS — I10 Essential (primary) hypertension: Secondary | ICD-10-CM | POA: Diagnosis not present

## 2017-10-11 DIAGNOSIS — Z882 Allergy status to sulfonamides status: Secondary | ICD-10-CM | POA: Diagnosis not present

## 2017-10-11 DIAGNOSIS — R110 Nausea: Secondary | ICD-10-CM | POA: Diagnosis not present

## 2017-10-11 DIAGNOSIS — M79604 Pain in right leg: Secondary | ICD-10-CM | POA: Diagnosis not present

## 2017-10-11 DIAGNOSIS — R079 Chest pain, unspecified: Secondary | ICD-10-CM | POA: Diagnosis not present

## 2017-10-11 DIAGNOSIS — I471 Supraventricular tachycardia: Secondary | ICD-10-CM | POA: Diagnosis not present

## 2017-10-11 DIAGNOSIS — R0789 Other chest pain: Secondary | ICD-10-CM | POA: Diagnosis not present

## 2017-10-11 DIAGNOSIS — Z79899 Other long term (current) drug therapy: Secondary | ICD-10-CM | POA: Diagnosis not present

## 2017-10-11 DIAGNOSIS — R002 Palpitations: Secondary | ICD-10-CM | POA: Diagnosis not present

## 2017-10-11 DIAGNOSIS — F17200 Nicotine dependence, unspecified, uncomplicated: Secondary | ICD-10-CM | POA: Diagnosis not present

## 2017-10-19 MED FILL — DESVENLAFAXINE SUC ER 50 MG: 50 | 90 days supply | Qty: 90 | Fill #1

## 2017-11-05 ENCOUNTER — Encounter: Payer: Self-pay | Admitting: Osteopathic Medicine

## 2017-11-07 ENCOUNTER — Ambulatory Visit (INDEPENDENT_AMBULATORY_CARE_PROVIDER_SITE_OTHER): Payer: 59 | Admitting: Osteopathic Medicine

## 2017-11-07 ENCOUNTER — Ambulatory Visit (INDEPENDENT_AMBULATORY_CARE_PROVIDER_SITE_OTHER): Payer: 59

## 2017-11-07 ENCOUNTER — Encounter: Payer: Self-pay | Admitting: Osteopathic Medicine

## 2017-11-07 VITALS — BP 135/89 | HR 86 | Temp 98.2°F | Resp 16 | Wt 182.8 lb

## 2017-11-07 DIAGNOSIS — M25511 Pain in right shoulder: Secondary | ICD-10-CM

## 2017-11-07 DIAGNOSIS — G8929 Other chronic pain: Secondary | ICD-10-CM

## 2017-11-07 MED ORDER — MELOXICAM 15 MG PO TABS: 15.0000 mg | ORAL_TABLET | Freq: Every day | ORAL | 0 refills | Status: DC

## 2017-11-07 MED ORDER — PREDNISONE 10 MG (48) PO TBPK: ORAL_TABLET | Freq: Every day | ORAL | 0 refills | Status: DC

## 2017-11-07 NOTE — Patient Instructions (Signed)
If shoulder is not better or if it gets worse, I would recommend follow-up with one of our sports medicine specialists (Dr Georgina Snell or Dr. Patton Salles Dr. Darene Lamer) for further evaluation in 2-4 weeks. Just call our office and ask for an appointment for sports medicine!
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
## 2017-11-14 ENCOUNTER — Encounter: Payer: Self-pay | Admitting: Rehabilitative and Restorative Service Providers"

## 2017-11-14 ENCOUNTER — Ambulatory Visit (INDEPENDENT_AMBULATORY_CARE_PROVIDER_SITE_OTHER): Payer: 59 | Admitting: Rehabilitative and Restorative Service Providers"

## 2017-11-14 DIAGNOSIS — R293 Abnormal posture: Secondary | ICD-10-CM | POA: Diagnosis not present

## 2017-11-14 DIAGNOSIS — M25511 Pain in right shoulder: Secondary | ICD-10-CM | POA: Diagnosis not present

## 2017-11-14 DIAGNOSIS — R29898 Other symptoms and signs involving the musculoskeletal system: Secondary | ICD-10-CM

## 2017-11-14 NOTE — Patient Instructions (Signed)
Self massage using a 3 inch plastic ball   Axial Extension (Chin Tuck)    Pull chin in and lengthen back of neck. Hold __5__ seconds while counting out loud. Repeat __10__ times. Do __several__ sessions per day.  Shoulder Blade Squeeze    Rotate shoulders back, then squeeze shoulder blades down and back. Hold 10 sec Repeat _10___ times. Do _several___ sessions per day.  Upper Back Strength: Lower Trapezius / Rotator Cuff " L's "     Arms in waitress pose, palms up. Press hands back and slide shoulder blades down. Hold for __5__ seconds. Repeat _10___ times. 1-2 times per day.    Scapular Retraction: Elbow Flexion (Standing)  "W's"     With elbows bent to 90, pinch shoulder blades together and rotate arms out, keeping elbows bent. Repeat __10__ times per set. Do __1-2__ sets per session. Do _several ___ sessions per day.  Scapula Adduction With Pectoralis Stretch: Low - Standing   Shoulders at 45 hands even with shoulders, keeping weight through legs, shift weight forward until you feel pull or stretch through the front of your chest. Hold _30__ seconds. Do _3__ times, _2-4__ times per day.   Scapula Adduction With Pectoralis Stretch: Mid-Range - Standing   Shoulders at 90 elbows even with shoulders, keeping weight through legs, shift weight forward until you feel pull or strength through the front of your chest. Hold __30_ seconds. Do _3__ times, __2-4_ times per day.   Scapula Adduction With Pectoralis Stretch: High - Standing   Shoulders at 120 hands up high on the doorway, keeping weight on feet, shift weight forward until you feel pull or stretch through the front of your chest. Hold _30__ seconds. Do _3__ times, _2-3__ times per day.  TENS UNIT: This is helpful for muscle pain and spasm.   Search and Purchase a TENS 7000 2nd edition at www.tenspros.com. It should be less than $30.     TENS unit instructions: Do not shower or bathe with the unit  on Turn the unit off before removing electrodes or batteries If the electrodes lose stickiness add a drop of water to the electrodes after they are disconnected from the unit and place on plastic sheet. If you continued to have difficulty, call the TENS unit company to purchase more electrodes. Do not apply lotion on the skin area prior to use. Make sure the skin is clean and dry as this will help prolong the life of the electrodes. After use, always check skin for unusual red areas, rash or other skin difficulties. If there are any skin problems, does not apply electrodes to the same area. Never remove the electrodes from the unit by pulling the wires. Do not use the TENS unit or electrodes other than as directed. Do not change electrode placement without consultating your therapist or physician. Keep 2 fingers with between each electrode.  Trigger Point Dry Needling  . What is Trigger Point Dry Needling (DN)? o DN is a physical therapy technique used to treat muscle pain and dysfunction. Specifically, DN helps deactivate muscle trigger points (muscle knots).  o A thin filiform needle is used to penetrate the skin and stimulate the underlying trigger point. The goal is for a local twitch response (LTR) to occur and for the trigger point to relax. No medication of any kind is injected during the procedure.   . What Does Trigger Point Dry Needling Feel Like?  o The procedure feels different for each individual patient. Some patients report  that they do not actually feel the needle enter the skin and overall the process is not painful. Very mild bleeding may occur. However, many patients feel a deep cramping in the muscle in which the needle was inserted. This is the local twitch response.   Marland Kitchen How Will I feel after the treatment? o Soreness is normal, and the onset of soreness may not occur for a few hours. Typically this soreness does not last longer than two days.  o Bruising is uncommon,  however; ice can be used to decrease any possible bruising.  o In rare cases feeling tired or nauseous after the treatment is normal. In addition, your symptoms may get worse before they get better, this period will typically not last longer than 24 hours.   . What Can I do After My Treatment? o Increase your hydration by drinking more water for the next 24 hours. o You may place ice or heat on the areas treated that have become sore, however, do not use heat on inflamed or bruised areas. Heat often brings more relief post needling. o You can continue your regular activities, but vigorous activity is not recommended initially after the treatment for 24 hours. o DN is best combined with other physical therapy such as strengthening, stretching, and other therapies.      [facility] at [facility] [address] [place], [place] [address]  [phone] (office) [phone] (fax)
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
## 2017-11-17 ENCOUNTER — Ambulatory Visit (INDEPENDENT_AMBULATORY_CARE_PROVIDER_SITE_OTHER): Payer: 59 | Admitting: Physical Therapy

## 2017-11-17 DIAGNOSIS — R293 Abnormal posture: Secondary | ICD-10-CM | POA: Diagnosis not present

## 2017-11-17 DIAGNOSIS — R29898 Other symptoms and signs involving the musculoskeletal system: Secondary | ICD-10-CM

## 2017-11-17 DIAGNOSIS — M25511 Pain in right shoulder: Secondary | ICD-10-CM | POA: Diagnosis not present

## 2017-11-17 NOTE — Patient Instructions (Signed)
Resisted External Rotation: in Neutral - Bilateral  PALMS UP!!! Sit or stand, tubing in both hands, elbows at sides, bent to 90, forearms forward. Pinch shoulder blades together and rotate forearms out. Keep elbows at sides. Repeat __10__ times per set. Do __2-3__ sets per session. Do __3-4__ sessions per week.  Resistive Band Rowing   With resistive band anchored in door, grasp both ends. Keeping elbows bent, pull back, squeezing shoulder blades together. Hold _3___ seconds. Repeat _10-30___ times. Do __1__ sessions per day.   [facility] at [facility] [address] [place], [place] [address]  [phone] (office) [phone] (fax)
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
## 2017-11-18 ENCOUNTER — Encounter: Payer: Self-pay | Admitting: Osteopathic Medicine

## 2017-11-18 DIAGNOSIS — N926 Irregular menstruation, unspecified: Secondary | ICD-10-CM

## 2017-11-20 ENCOUNTER — Ambulatory Visit (INDEPENDENT_AMBULATORY_CARE_PROVIDER_SITE_OTHER): Payer: 59 | Admitting: Rehabilitative and Restorative Service Providers"

## 2017-11-20 ENCOUNTER — Encounter: Payer: Self-pay | Admitting: Rehabilitative and Restorative Service Providers"

## 2017-11-20 DIAGNOSIS — R293 Abnormal posture: Secondary | ICD-10-CM

## 2017-11-20 DIAGNOSIS — R29898 Other symptoms and signs involving the musculoskeletal system: Secondary | ICD-10-CM

## 2017-11-20 DIAGNOSIS — M25511 Pain in right shoulder: Secondary | ICD-10-CM

## 2017-11-20 NOTE — Therapy (Signed)
[place], [place], [address] Phone: [phone]   Fax:  [phone]  Physical Therapy Treatment  Patient Details  Name: Deborah Goodman MRN: [id number] Date of Birth: 03/26/1976 Referring Provider: Dr. Sheppard Coil    Encounter Date: 11/20/2017  PT End of Session - 11/20/17 1527    Visit Number  3    Number of Visits  12    Date for PT Re-Evaluation  12/26/17    PT Start Time  6789    PT Stop Time  1626    PT Time Calculation (min)  56 min    Activity Tolerance  Patient tolerated treatment well       Past Medical History:  Diagnosis Date  . Acid reflux   . Depression   . GAD (generalized anxiety disorder) 02/10/2017  . Hypertension 02/10/2017  . Palpitations 02/10/2017   a. 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   . Systolic murmur   . Tobacco use     Past Surgical History:  Procedure Laterality Date  . SACRO-ILIAC PINNING  2016   fusion, in [place]    There were no vitals filed for this visit.  Subjective Assessment - 11/20/17 1532    Subjective  Symptoms are about the same. She feels the doorway stretch helps but she continues to have pain. The shoudler was hurting more this am when she woke up so she didn't do any exercises today. She did not get the TENS unit but it is on the way.     Currently in Pain?  Yes    Pain Score  4     Pain Location  Shoulder    Pain Orientation  Right    Pain Descriptors / Indicators  Aching    Pain Type  Chronic pain    Pain Onset  More than a month ago    Pain Frequency  Intermittent         [facility] PT Assessment - 11/20/17 0001      AROM   Right Shoulder Extension  43 Degrees    Right Shoulder Flexion  154 Degrees    Right Shoulder ABduction  160 Degrees      Palpation   Palpation comment  tenderness and pain through the anterior chest in the sternum and costcocondral junction through upper and mid sternum into the  anterior ribs; muscular tightness noted through Rt ant/lat/posterior cervical and upper trap; pecs; teres                  [facility] Adult PT Treatment/Exercise - 11/20/17 0001      Shoulder Exercises: Standing   External Rotation  Strengthening;Both;10 reps;Theraband    Theraband Level (Shoulder External Rotation)  Level 1 (Yellow)    Extension  Strengthening;Both;10 reps;Theraband    Theraband Level (Shoulder Extension)  Level 1 (Yellow)    Row  Strengthening;Both;10 reps;Theraband    Theraband Level (Shoulder Row)  Level 1 (Yellow)    Other Standing Exercises  axial ext 3 sec x 10; scap squeeze with noodle 10 sec x 10; L's x 10; W's x 10      Shoulder Exercises: Stretch   Other Shoulder Stretches  3 way doorway stretch 30 sec x 2 reps each position     Other Shoulder Stretches  biceps stretch 30 sec x 2       Moist Heat Therapy   Number Minutes Moist Heat  20 Minutes  Moist Heat Location  Cervical;Shoulder Rt       Engineer, mining Action  IFC    Electrical Stimulation Parameters  to tolerance    Electrical Stimulation Goals  Pain;Tone      Ultrasound   Ultrasound Location  anterior chest/pecs; biceps Rt     Ultrasound Parameters  1.5 w/cm2; 100%; 1 mHz; 8 min     Ultrasound Goals  Pain;Other (Comment) tightness       Manual Therapy   Manual Therapy  Soft tissue mobilization    Manual therapy comments  pt is supine     Soft tissue mobilization  pecs; biceps              PT Education - 11/20/17 1557    Education provided  Yes    Education Details  DN, HEP     Person(s) Educated  Patient    Methods  Explanation    Comprehension  Verbalized understanding          PT Long Term Goals - 11/20/17 1528      PT LONG TERM GOAL #1   Title  Improve posture and alignment with patient to demonstrate improved upright posture with posterior shoulder girdle engaged 12/26/17    Time  6     Period  Weeks    Status  On-going      PT LONG TERM GOAL #2   Title  Increase AROM Rt shoulder to equal or greater than AROM Lt shoulder 12/26/17    Time  6    Period  Weeks    Status  On-going      PT LONG TERM GOAL #3   Title  Decrease pain by 75% allowing patient to preform functional and work activities with minimal to no difficluty 12/26/17    Time  6    Period  Weeks    Status  On-going      PT LONG TERM GOAL #4   Title  Independent in HEP 12/26/17    Time  6    Period  Weeks    Status  On-going      PT LONG TERM GOAL #5   Title  Improve FOTO to 30% limitation 12/26/17     Time  6    Period  Weeks    Status  On-going            Plan - 11/20/17 1534    Clinical Impression Statement  Persistent pain in the Rt shoulder area. Trial of [place] and manual work with stretching. Added biceps stretch and prolonged snow angel stretch today. Patient demonstrates good gains in shoulder flexion and abduction but has continued pain with AROM and tightness with palpation expecially anterior chest through pecs and anterior shoulder through the biceps and biceps tendon area. She has tenderness to palpation through the sternum and anterior ribs suggesting possible costochondritis. Will continue with gentle stretching and avoid sleeping on the Rt side if possible. Continue with treatment possibly trying DN at next visit.     Rehab Potential  Good    PT Frequency  2x / week    PT Duration  6 weeks    PT Treatment/Interventions  Patient/family education;ADLs/Self Care Home Management;Cryotherapy;Electrical Stimulation;Iontophoresis 4mg /ml Dexamethasone;Moist Heat;Ultrasound;Therapeutic activities;Therapeutic exercise;Dry needling;Manual techniques;Neuromuscular re-education    PT Next Visit Plan  continue postural correction; stretching; manual work; assess response to DN; manual work; modalities as indicated  Consulted and Agree with Plan of Care  Patient       Patient will benefit from skilled  therapeutic intervention in order to improve the following deficits and impairments:  Postural dysfunction, Improper body mechanics, Pain, Decreased mobility, Decreased range of motion, Decreased activity tolerance, Decreased strength  Visit Diagnosis: Acute pain of right shoulder  Other symptoms and signs involving the musculoskeletal system  Abnormal posture     Problem List Patient Active Problem List   Diagnosis Date Noted  . Transaminitis 05/05/2017  . Lung nodule < 6cm on CT 05/05/2017  . Fever 04/27/2017  . PSVT (paroxysmal supraventricular tachycardia) ([place]) 03/13/2017  . Tobacco use 03/13/2017  . Palpitations 02/10/2017  . GAD (generalized anxiety disorder) 02/10/2017  . Hypertension 02/10/2017  . Abnormal thyroid blood test 02/10/2017    Celyn Nilda Simmer PT, MPH  11/20/2017, 4:32 PM  [facility] [place], [place], [address] Phone: [phone]   Fax:  [phone]  Name: TENEKA MALMBERG MRN: [id number] Date of Birth: [birth year]

## 2017-11-20 NOTE — Patient Instructions (Addendum)
Trigger Point Dry Needling  . What is Trigger Point Dry Needling (DN)? o DN is a physical therapy technique used to treat muscle pain and dysfunction. Specifically, DN helps deactivate muscle trigger points (muscle knots).  o A thin filiform needle is used to penetrate the skin and stimulate the underlying trigger point. The goal is for a local twitch response (LTR) to occur and for the trigger point to relax. No medication of any kind is injected during the procedure.   . What Does Trigger Point Dry Needling Feel Like?  o The procedure feels different for each individual patient. Some patients report that they do not actually feel the needle enter the skin and overall the process is not painful. Very mild bleeding may occur. However, many patients feel a deep cramping in the muscle in which the needle was inserted. This is the local twitch response.   Marland Kitchen How Will I feel after the treatment? o Soreness is normal, and the onset of soreness may not occur for a few hours. Typically this soreness does not last longer than two days.  o Bruising is uncommon, however; ice can be used to decrease any possible bruising.  o In rare cases feeling tired or nauseous after the treatment is normal. In addition, your symptoms may get worse before they get better, this period will typically not last longer than 24 hours.   . What Can I do After My Treatment? o Increase your hydration by drinking more water for the next 24 hours. o You may place ice or heat on the areas treated that have become sore, however, do not use heat on inflamed or bruised areas. Heat often brings more relief post needling. o You can continue your regular activities, but vigorous activity is not recommended initially after the treatment for 24 hours. o DN is best combined with other physical therapy such as strengthening, stretching, and other therapies.    ELBOW: Biceps - Standing    Standing in doorway, place one hand on wall, elbow  straight. Lean forward. Hold _30__ seconds. _3__ reps per set, _2-3 per day

## 2017-11-23 ENCOUNTER — Encounter: Payer: 59 | Admitting: Physical Therapy

## 2017-11-27 ENCOUNTER — Encounter: Payer: 59 | Admitting: Rehabilitative and Restorative Service Providers"

## 2017-11-29 ENCOUNTER — Encounter: Payer: 59 | Admitting: Physical Therapy

## 2017-11-29 ENCOUNTER — Telehealth: Payer: Self-pay | Admitting: Physical Therapy

## 2017-11-29 NOTE — Telephone Encounter (Signed)
Called patient and left voice mail regarding her missed appointment today.  I requested she call back to schedule future appointment.    [name], PTA 11/29/17 11:21 AM

## 2017-12-07 ENCOUNTER — Ambulatory Visit (INDEPENDENT_AMBULATORY_CARE_PROVIDER_SITE_OTHER): Payer: 59 | Admitting: Physical Therapy

## 2017-12-07 DIAGNOSIS — R29898 Other symptoms and signs involving the musculoskeletal system: Secondary | ICD-10-CM

## 2017-12-07 DIAGNOSIS — R293 Abnormal posture: Secondary | ICD-10-CM

## 2017-12-07 DIAGNOSIS — M25511 Pain in right shoulder: Secondary | ICD-10-CM
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
## 2017-12-07 NOTE — Patient Instructions (Signed)
Side Pull: Double Arm   On back, knees bent, feet flat. Arms perpendicular to body, shoulder level, elbows straight but relaxed. Pull arms out to sides, elbows straight. Resistance band comes across collarbones, hands toward floor. Hold momentarily. Slowly return to starting position. Repeat _10__ times. Band color __yellow___   Sash   THUMB UP.  On back, knees bent, feet flat, left hand on left hip, right hand above left. Pull right arm DIAGONALLY (hip to shoulder) across chest. Bring right arm along head toward floor. Hold momentarily. Slowly return to starting position. Repeat _10__ times. . Band color __yellow___   Shoulder Rotation: Double Arm   On back, knees bent, feet flat, elbows tucked at sides, bent 90, hands palms up. Pull hands apart and down toward floor, keeping elbows near sides. Hold momentarily. Slowly return to starting position. Repeat __10_ times. Band color ___yellow___    [facility] [place], [place] [address]  [phone] (office) [phone] (fax)

## 2017-12-08 ENCOUNTER — Ambulatory Visit (INDEPENDENT_AMBULATORY_CARE_PROVIDER_SITE_OTHER): Payer: 59 | Admitting: Physical Therapy

## 2017-12-08 DIAGNOSIS — R29898 Other symptoms and signs involving the musculoskeletal system: Secondary | ICD-10-CM | POA: Diagnosis not present

## 2017-12-08 DIAGNOSIS — R293 Abnormal posture: Secondary | ICD-10-CM

## 2017-12-08 DIAGNOSIS — M25511 Pain in right shoulder: Secondary | ICD-10-CM
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
## 2017-12-08 NOTE — Patient Instructions (Signed)

## 2017-12-14 ENCOUNTER — Encounter: Payer: Self-pay | Admitting: Osteopathic Medicine

## 2017-12-14 DIAGNOSIS — Z0189 Encounter for other specified special examinations: Secondary | ICD-10-CM

## 2017-12-14 NOTE — Telephone Encounter (Signed)
Orders are in for the test, you should be able to just go to the lab for blood draw. Since ANA has been positive before but other inflammatory tests were negative, this is inconclusive and if it is positive again I will go ahead and send you to a rheumatology specialist

## 2017-12-15 DIAGNOSIS — Z0189 Encounter for other specified special examinations: Secondary | ICD-10-CM | POA: Diagnosis not present

## 2017-12-16 ENCOUNTER — Encounter: Payer: Self-pay | Admitting: Osteopathic Medicine

## 2017-12-16 DIAGNOSIS — Z832 Family history of diseases of the blood and blood-forming organs and certain disorders involving the immune mechanism: Secondary | ICD-10-CM

## 2017-12-18 DIAGNOSIS — Z832 Family history of diseases of the blood and blood-forming organs and certain disorders involving the immune mechanism: Secondary | ICD-10-CM | POA: Insufficient documentation

## 2017-12-18 LAB — ANTI-NUCLEAR AB-TITER (ANA TITER): ANA Titer 1: 1:40 {titer} — ABNORMAL HIGH

## 2017-12-18 LAB — ANA: ANA: POSITIVE — AB

## 2017-12-20 ENCOUNTER — Other Ambulatory Visit: Payer: Self-pay | Admitting: Osteopathic Medicine

## 2017-12-20 ENCOUNTER — Encounter: Payer: 59 | Admitting: Physical Therapy

## 2017-12-20 DIAGNOSIS — R768 Other specified abnormal immunological findings in serum: Secondary | ICD-10-CM

## 2017-12-20 NOTE — Progress Notes (Signed)
Referral to rheum

## 2017-12-22 ENCOUNTER — Ambulatory Visit (INDEPENDENT_AMBULATORY_CARE_PROVIDER_SITE_OTHER): Payer: 59 | Admitting: Physical Therapy

## 2017-12-22 DIAGNOSIS — M25511 Pain in right shoulder: Secondary | ICD-10-CM | POA: Diagnosis not present

## 2017-12-22 DIAGNOSIS — R29898 Other symptoms and signs involving the musculoskeletal system: Secondary | ICD-10-CM

## 2017-12-22 DIAGNOSIS — R293 Abnormal posture: Secondary | ICD-10-CM
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
## 2017-12-24 ENCOUNTER — Other Ambulatory Visit: Payer: Self-pay | Admitting: Osteopathic Medicine

## 2017-12-25 ENCOUNTER — Other Ambulatory Visit: Payer: Self-pay | Admitting: Osteopathic Medicine

## 2017-12-25 ENCOUNTER — Encounter: Payer: Self-pay | Admitting: Obstetrics & Gynecology

## 2017-12-25 ENCOUNTER — Encounter: Payer: Self-pay | Admitting: Osteopathic Medicine

## 2017-12-25 ENCOUNTER — Ambulatory Visit (INDEPENDENT_AMBULATORY_CARE_PROVIDER_SITE_OTHER): Payer: 59 | Admitting: Obstetrics & Gynecology

## 2017-12-25 VITALS — BP 125/80 | HR 86 | Resp 16 | Ht 66.0 in | Wt 182.0 lb

## 2017-12-25 DIAGNOSIS — Z01419 Encounter for gynecological examination (general) (routine) without abnormal findings: Secondary | ICD-10-CM | POA: Diagnosis not present

## 2017-12-25 DIAGNOSIS — Z124 Encounter for screening for malignant neoplasm of cervix: Secondary | ICD-10-CM

## 2017-12-25 DIAGNOSIS — Z1151 Encounter for screening for human papillomavirus (HPV): Secondary | ICD-10-CM

## 2017-12-25 DIAGNOSIS — Z113 Encounter for screening for infections with a predominantly sexual mode of transmission: Secondary | ICD-10-CM | POA: Diagnosis not present

## 2017-12-25 DIAGNOSIS — R232 Flushing: Secondary | ICD-10-CM

## 2017-12-25 DIAGNOSIS — Z800 Family history of malignant neoplasm of digestive organs: Secondary | ICD-10-CM

## 2017-12-25 MED ORDER — MELOXICAM 15 MG PO TABS: 15.0000 mg | ORAL_TABLET | Freq: Every day | ORAL | 0 refills | Status: DC
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
## 2017-12-26 ENCOUNTER — Other Ambulatory Visit: Payer: Self-pay | Admitting: Osteopathic Medicine

## 2017-12-26 ENCOUNTER — Encounter: Payer: Self-pay | Admitting: Osteopathic Medicine

## 2017-12-26 LAB — FOLLICLE STIMULATING HORMONE: FSH: 10.3 m[IU]/mL

## 2017-12-26 MED ORDER — MELOXICAM 15 MG PO TABS: 15.0000 mg | ORAL_TABLET | Freq: Every day | ORAL | 0 refills | Status: DC

## 2017-12-26 NOTE — Telephone Encounter (Signed)
See last progress note: For persistent shoulder pain, would recommend follow-up with sports medicine. Will not refill steroids without a visit. She has also been referred to rheumatology. I'm happy to see her at her scheduled appointment, or she may be able to get into see Dr. Georgina Snell or Dr. Darene Lamer sooner

## 2017-12-26 NOTE — Telephone Encounter (Signed)
Spoke with patient and she states she was 100% better on the prednisone and once stopped the pain and rashes are back.  Sent to front office to,schedule followup visit with you. KG LPN

## 2017-12-27 ENCOUNTER — Ambulatory Visit: Payer: 59

## 2017-12-27 DIAGNOSIS — F419 Anxiety disorder, unspecified: Secondary | ICD-10-CM | POA: Diagnosis not present

## 2017-12-27 DIAGNOSIS — M79605 Pain in left leg: Secondary | ICD-10-CM | POA: Diagnosis not present

## 2017-12-27 DIAGNOSIS — I10 Essential (primary) hypertension: Secondary | ICD-10-CM | POA: Diagnosis not present

## 2017-12-27 DIAGNOSIS — Z882 Allergy status to sulfonamides status: Secondary | ICD-10-CM | POA: Diagnosis not present

## 2017-12-27 DIAGNOSIS — Z79899 Other long term (current) drug therapy: Secondary | ICD-10-CM | POA: Diagnosis not present

## 2017-12-27 DIAGNOSIS — M79661 Pain in right lower leg: Secondary | ICD-10-CM | POA: Diagnosis not present

## 2017-12-27 DIAGNOSIS — M79604 Pain in right leg: Secondary | ICD-10-CM | POA: Diagnosis not present

## 2017-12-27 DIAGNOSIS — I471 Supraventricular tachycardia: Secondary | ICD-10-CM | POA: Diagnosis not present

## 2017-12-27 DIAGNOSIS — M79662 Pain in left lower leg: Secondary | ICD-10-CM | POA: Diagnosis not present

## 2017-12-27 DIAGNOSIS — M2550 Pain in unspecified joint: Secondary | ICD-10-CM | POA: Diagnosis not present

## 2017-12-27 DIAGNOSIS — M359 Systemic involvement of connective tissue, unspecified: Secondary | ICD-10-CM | POA: Diagnosis not present

## 2017-12-27 DIAGNOSIS — F17200 Nicotine dependence, unspecified, uncomplicated: Secondary | ICD-10-CM | POA: Diagnosis not present

## 2017-12-27 DIAGNOSIS — M549 Dorsalgia, unspecified: Secondary | ICD-10-CM | POA: Diagnosis not present
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
## 2017-12-28 ENCOUNTER — Ambulatory Visit (INDEPENDENT_AMBULATORY_CARE_PROVIDER_SITE_OTHER): Payer: Self-pay

## 2017-12-28 ENCOUNTER — Encounter: Payer: Self-pay | Admitting: Rheumatology

## 2017-12-28 ENCOUNTER — Ambulatory Visit (INDEPENDENT_AMBULATORY_CARE_PROVIDER_SITE_OTHER): Payer: 59 | Admitting: Rheumatology

## 2017-12-28 VITALS — BP 150/98 | HR 85 | Resp 17 | Ht 66.0 in | Wt 180.0 lb

## 2017-12-28 DIAGNOSIS — Z8659 Personal history of other mental and behavioral disorders: Secondary | ICD-10-CM | POA: Diagnosis not present

## 2017-12-28 DIAGNOSIS — M3500 Sicca syndrome, unspecified: Secondary | ICD-10-CM

## 2017-12-28 DIAGNOSIS — R5383 Other fatigue: Secondary | ICD-10-CM | POA: Diagnosis not present

## 2017-12-28 DIAGNOSIS — M25562 Pain in left knee: Secondary | ICD-10-CM | POA: Diagnosis not present

## 2017-12-28 DIAGNOSIS — M79641 Pain in right hand: Secondary | ICD-10-CM | POA: Diagnosis not present

## 2017-12-28 DIAGNOSIS — M25561 Pain in right knee: Secondary | ICD-10-CM

## 2017-12-28 DIAGNOSIS — M79642 Pain in left hand: Secondary | ICD-10-CM

## 2017-12-28 DIAGNOSIS — Z87898 Personal history of other specified conditions: Secondary | ICD-10-CM

## 2017-12-28 DIAGNOSIS — Z840 Family history of diseases of the skin and subcutaneous tissue: Secondary | ICD-10-CM

## 2017-12-28 DIAGNOSIS — Z720 Tobacco use: Secondary | ICD-10-CM

## 2017-12-28 DIAGNOSIS — G8929 Other chronic pain: Secondary | ICD-10-CM

## 2017-12-28 DIAGNOSIS — Z7289 Other problems related to lifestyle: Secondary | ICD-10-CM

## 2017-12-28 DIAGNOSIS — Z8639 Personal history of other endocrine, nutritional and metabolic disease: Secondary | ICD-10-CM | POA: Diagnosis not present

## 2017-12-28 DIAGNOSIS — R768 Other specified abnormal immunological findings in serum: Secondary | ICD-10-CM

## 2017-12-28 DIAGNOSIS — Z789 Other specified health status: Secondary | ICD-10-CM | POA: Diagnosis not present

## 2017-12-28 DIAGNOSIS — I7300 Raynaud's syndrome without gangrene: Secondary | ICD-10-CM

## 2017-12-28 DIAGNOSIS — Z8679 Personal history of other diseases of the circulatory system: Secondary | ICD-10-CM | POA: Diagnosis not present

## 2017-12-28 DIAGNOSIS — Z8269 Family history of other diseases of the musculoskeletal system and connective tissue: Secondary | ICD-10-CM

## 2017-12-28 DIAGNOSIS — F1090 Alcohol use, unspecified, uncomplicated: Secondary | ICD-10-CM

## 2017-12-28 LAB — CYTOLOGY - PAP
Chlamydia: NEGATIVE
DIAGNOSIS: NEGATIVE
HPV: NOT DETECTED
Neisseria Gonorrhea: NEGATIVE

## 2017-12-29 ENCOUNTER — Encounter: Payer: Self-pay | Admitting: Rheumatology

## 2017-12-29 ENCOUNTER — Ambulatory Visit: Payer: 59 | Admitting: Osteopathic Medicine

## 2017-12-29 NOTE — Progress Notes (Signed)
Vitamin D 50,000 units twice a week. Total 90 days supply. Repeat levels in 3 months. We will discuss the labs at follow-up visit

## 2018-01-01 ENCOUNTER — Telehealth: Payer: Self-pay | Admitting: *Deleted

## 2018-01-01 MED ORDER — VITAMIN D (ERGOCALCIFEROL) 1.25 MG (50000 UNIT) PO CAPS: 50000.0000 [IU] | ORAL_CAPSULE | ORAL | 0 refills | Status: DC

## 2018-01-01 NOTE — Telephone Encounter (Signed)
If the patient is running fever she should see her PCP. EBV infection in the past should not cause ongoing fevers. All of her autoimmune labs are negative. Besides vitamin D deficiency other labs are unremarkable as well. She may cancel her follow-up appointment.

## 2018-01-01 NOTE — Progress Notes (Signed)
LFTs are better . TSH is high. Please fax results to her PCP.

## 2018-01-02 LAB — CK: Total CK: 39 U/L (ref 29–143)

## 2018-01-02 LAB — ANTI-SMITH ANTIBODY: ENA SM AB SER-ACNC: NEGATIVE AI

## 2018-01-02 LAB — ANTI-SCLERODERMA ANTIBODY: Scleroderma (Scl-70) (ENA) Antibody, IgG: <1 AI

## 2018-01-02 LAB — CBC WITH DIFFERENTIAL/PLATELET
BASOS ABS: 37 {cells}/uL (ref 0–200)
BASOS PCT: 0.3 %
EOS ABS: 124 {cells}/uL (ref 15–500)
Eosinophils Relative: 1 %
HEMATOCRIT: 45.1 % — AB (ref 35.0–45.0)
HEMOGLOBIN: 15.3 g/dL (ref 11.7–15.5)
LYMPHS ABS: 3546 {cells}/uL (ref 850–3900)
MCH: 31.3 pg (ref 27.0–33.0)
MCHC: 33.9 g/dL (ref 32.0–36.0)
MCV: 92.2 fL (ref 80.0–100.0)
MPV: 11 fL (ref 7.5–12.5)
Monocytes Relative: 10.1 %
NEUTROS ABS: 7440 {cells}/uL (ref 1500–7800)
Neutrophils Relative %: 60 %
Platelets: 143 10*3/uL (ref 140–400)
RBC: 4.89 10*6/uL (ref 3.80–5.10)
RDW: 12.4 % (ref 11.0–15.0)
Total Lymphocyte: 28.6 %
WBC mixed population: 1252 cells/uL — ABNORMAL HIGH (ref 200–950)
WBC: 12.4 10*3/uL — ABNORMAL HIGH (ref 3.8–10.8)

## 2018-01-02 LAB — CARDIOLIPIN ANTIBODIES, IGG, IGM, IGA
Anticardiolipin IgA: <11 [APL'U]
Anticardiolipin IgM: <12 [MPL'U]

## 2018-01-02 LAB — COMPLETE METABOLIC PANEL WITH GFR
AG Ratio: 1.5 (calc) (ref 1.0–2.5)
ALBUMIN MSPROF: 4.7 g/dL (ref 3.6–5.1)
ALT: 61 U/L — ABNORMAL HIGH (ref 6–29)
AST: 30 U/L (ref 10–30)
Alkaline phosphatase (APISO): 62 U/L (ref 33–115)
BUN: 14 mg/dL (ref 7–25)
CO2: 28 mmol/L (ref 20–32)
CREATININE: 0.7 mg/dL (ref 0.50–1.10)
Calcium: 9.4 mg/dL (ref 8.6–10.2)
Chloride: 100 mmol/L (ref 98–110)
GFR, EST AFRICAN AMERICAN: 126 mL/min/{1.73_m2} (ref >=60)
GFR, Est Non African American: 108 mL/min/{1.73_m2} (ref >=60)
GLOBULIN: 3.1 g/dL (ref 1.9–3.7)
Glucose, Bld: 83 mg/dL (ref 65–99)
Potassium: 3.6 mmol/L (ref 3.5–5.3)
SODIUM: 138 mmol/L (ref 135–146)
TOTAL PROTEIN: 7.8 g/dL (ref 6.1–8.1)
Total Bilirubin: 1.7 mg/dL — ABNORMAL HIGH (ref 0.2–1.2)

## 2018-01-02 LAB — HEPATITIS C ANTIBODY
Hepatitis C Ab: NONREACTIVE
SIGNAL TO CUT-OFF: 0.03 (ref <1.00)

## 2018-01-02 LAB — LUPUS ANTICOAGULANT EVAL W/ REFLEX
DRVVT SCREEN: 39 s (ref <=45)
PTT LA SCREEN: 30 s (ref <=40)

## 2018-01-02 LAB — CYCLIC CITRUL PEPTIDE ANTIBODY, IGG: Cyclic Citrullin Peptide Ab: <16 UNITS

## 2018-01-02 LAB — URINALYSIS, ROUTINE W REFLEX MICROSCOPIC
BILIRUBIN URINE: NEGATIVE
Glucose, UA: NEGATIVE
Hgb urine dipstick: NEGATIVE
Ketones, ur: NEGATIVE
Leukocytes, UA: NEGATIVE
NITRITE: NEGATIVE
PROTEIN: NEGATIVE
Specific Gravity, Urine: 1.025 (ref 1.001–1.03)
pH: <=5 (ref 5.0–8.0)

## 2018-01-02 LAB — GLUCOSE 6 PHOSPHATE DEHYDROGENASE: G-6PDH: 18.6 U/g Hgb (ref 7.0–20.5)

## 2018-01-02 LAB — VITAMIN D 25 HYDROXY (VIT D DEFICIENCY, FRACTURES): VIT D 25 HYDROXY: 13 ng/mL — AB (ref 30–100)

## 2018-01-02 LAB — RHEUMATOID FACTOR: Rhuematoid fact SerPl-aCnc: <14 IU/mL (ref <14)

## 2018-01-02 LAB — SJOGRENS SYNDROME-B EXTRACTABLE NUCLEAR ANTIBODY: SSB (La) (ENA) Antibody, IgG: <1 AI

## 2018-01-02 LAB — TSH: TSH: 5.18 mIU/L — ABNORMAL HIGH

## 2018-01-02 LAB — BETA-2 GLYCOPROTEIN ANTIBODIES
Beta-2 Glyco 1 IgA: <9 SAU (ref <=20)
Beta-2 Glyco 1 IgM: <9 SMU (ref <=20)
Beta-2 Glyco I IgG: <9 SGU (ref <=20)

## 2018-01-02 LAB — HEPATITIS B SURFACE ANTIGEN: HEP B S AG: NONREACTIVE

## 2018-01-02 LAB — ANGIOTENSIN CONVERTING ENZYME: ANGIOTENSIN-CONVERTING ENZYME: 42 U/L (ref 9–67)

## 2018-01-02 LAB — SJOGRENS SYNDROME-A EXTRACTABLE NUCLEAR ANTIBODY: SSA (Ro) (ENA) Antibody, IgG: <1 AI

## 2018-01-02 LAB — RNP ANTIBODY: Ribonucleic Protein(ENA) Antibody, IgG: <1 AI

## 2018-01-02 LAB — ANTI-DNA ANTIBODY, DOUBLE-STRANDED

## 2018-01-02 LAB — SEDIMENTATION RATE: SED RATE: 6 mm/h (ref 0–20)

## 2018-01-02 LAB — C3 AND C4
C3 COMPLEMENT: 163 mg/dL (ref 83–193)
C4 Complement: 23 mg/dL (ref 15–57)

## 2018-01-02 LAB — HEPATITIS B CORE ANTIBODY, IGM: Hep B C IgM: NONREACTIVE

## 2018-01-08 ENCOUNTER — Ambulatory Visit (INDEPENDENT_AMBULATORY_CARE_PROVIDER_SITE_OTHER): Payer: 59 | Admitting: Osteopathic Medicine

## 2018-01-08 ENCOUNTER — Encounter: Payer: Self-pay | Admitting: Osteopathic Medicine

## 2018-01-08 VITALS — BP 134/82 | HR 91 | Wt 183.0 lb

## 2018-01-08 DIAGNOSIS — R509 Fever, unspecified: Secondary | ICD-10-CM

## 2018-01-08 DIAGNOSIS — M791 Myalgia, unspecified site: Secondary | ICD-10-CM

## 2018-01-08 DIAGNOSIS — E559 Vitamin D deficiency, unspecified: Secondary | ICD-10-CM

## 2018-01-08 DIAGNOSIS — R7989 Other specified abnormal findings of blood chemistry: Secondary | ICD-10-CM

## 2018-01-08 NOTE — Progress Notes (Signed)
HPI: Deborah Goodman is a 42 y.o. female who  has a past medical history of Acid reflux, Depression, GAD (generalized anxiety disorder) (02/10/2017), Hypertension (02/10/2017), Palpitations (02/10/2017), SVT (supraventricular tachycardia) ([place]), Systolic murmur, and Tobacco use.  she presents to [facility] today, 01/08/18,  for chief complaint of: Discuss recent lab results, fatigue, muscle aches/pains, night sweats/fever  Ever since getting sick in May with EBV, has experienced on and off muscle pains, significant fatigue, fevers/night sweats several days out of the week at least with fevers measured up to 101. She hasn't really kept a diary of these instances. We prescribed a steroid pack for shoulder pain and she states that on steroids she felt traumatically better. ANA has been positive, we sent to rheumatology area did note reviewed, recent telephone note states workup was negative and patient could cancel follow-up appointment if desired.     Past medical, surgical, social and family history reviewed:  Patient Active Problem List   Diagnosis Date Noted  . Morbid obesity ([place]) 12/25/2017  . Family history of autoimmune disorder 12/18/2017  . Transaminitis 05/05/2017  . Lung nodule < 6cm on CT 05/05/2017  . Fever 04/27/2017  . PSVT (paroxysmal supraventricular tachycardia) ([place]) 03/13/2017  . Tobacco use 03/13/2017  . Palpitations 02/10/2017  . GAD (generalized anxiety disorder) 02/10/2017  . Hypertension 02/10/2017  . Abnormal thyroid blood test 02/10/2017    Past Surgical History:  Procedure Laterality Date  . SACRO-ILIAC PINNING  2016   fusion, in [facility] History   Tobacco Use  . Smoking status: Current Every Day Smoker    Packs/day: 0.50    Years: 22.00    Pack years: 11.00    Types: Cigarettes  . Smokeless tobacco: Never Used  Substance Use Topics  . Alcohol use: Yes    Family History  Problem Relation Age of Onset   . Depression Mother   . Hypertension Mother   . Diabetes Father   . Arrhythmia Sister        palpitations  . Cancer Maternal Grandfather        COLON  . Diabetes Paternal Grandfather      Current medication list and allergy/intolerance information reviewed:    Current Outpatient Medications  Medication Sig Dispense Refill  . albuterol (PROVENTIL HFA;VENTOLIN HFA) 108 (90 Base) MCG/ACT inhaler Inhale 2 puffs into the lungs every 6 (six) hours as needed for wheezing. 2 Inhaler 11  . B Complex-C (B-COMPLEX WITH VITAMIN C) tablet Take 1 tablet by mouth daily.    . cholecalciferol (VITAMIN D) 1000 units tablet Take 1,000 Units by mouth daily.    . ciprofloxacin (CILOXAN) 0.3 % ophthalmic solution INSTILL 2 DROPS INTO RIGHT EYE EVERY 2 HOURS AS NEEDED WHILE AWAKE  0  . desvenlafaxine (PRISTIQ) 50 MG 24 hr tablet Take 1 tablet (50 mg total) by mouth daily. 90 tablet 3  . ipratropium (ATROVENT) 0.06 % nasal spray Place 2 sprays into both nostrils 4 (four) times daily. (Patient taking differently: Place 2 sprays into both nostrils as needed. ) 15 mL 12  . meloxicam (MOBIC) 15 MG tablet Take 1 tablet (15 mg total) by mouth daily. Pt needs to keep upcoming appt w/pcp. 30 tablet 0  . metoprolol (LOPRESSOR) 50 MG tablet Take 1 tablet (50 mg total) by mouth 2 (two) times daily. 60 tablet 6  . Multiple Vitamin (MULTIVITAMIN WITH MINERALS) TABS tablet Take 1 tablet by mouth daily.    . Omega-3  Fatty Acids (FISH OIL PO) Take 1 capsule by mouth daily.    . predniSONE (STERAPRED UNI-PAK 48 TAB) 10 MG (48) TBPK tablet 12 DAY TAPER, SEE PACKAGE INSTRUCTIONS  0  . Vitamin D, Ergocalciferol, (DRISDOL) 50000 units CAPS capsule Take 1 capsule (50,000 Units total) by mouth 2 (two) times a week. 24 capsule 0   No current facility-administered medications for this visit.     Allergies  Allergen Reactions  . Fish Allergy Nausea And Vomiting    ALL SEAFOOD  . Shellfish Allergy Nausea And Vomiting    ALL  SEAFOOD  . Sulfa Antibiotics Other (See Comments)    Thrush reaction      Review of Systems:  Constitutional:  +fever, +chills, No recent illness, No unintentional weight changes. +significant fatigue.   HEENT: No  headache, no vision change  Cardiac: No  chest pain, No  pressure, No palpitations  Respiratory:  No  shortness of breath. No  Cough  Gastrointestinal: No  abdominal pain, No  nause  Musculoskeletal: +myalgia/arthralgia  Skin: No  Rash, +raynaud's  Neurologic: No  weakness, No  dizziness  Psychiatric: No  concerns with depression, No  concerns with anxiety, No sleep problems, No mood problems   Exam:  BP 134/82   Pulse 91   Wt 183 lb (83 kg)   LMP 12/17/2017   BMI 29.54 kg/m   Constitutional: VS see above. General Appearance: alert, well-developed, well-nourished, NAD  Eyes: Normal lids and conjunctive, non-icteric sclera  Ears, Nose, Mouth, Throat: MMM, Normal external inspection ears/nares/mouth/lips/gums.   Neck: No masses, trachea midline.   Respiratory: Normal respiratory effort. no wheeze, no rhonchi, no rales  Cardiovascular: S1/S2 normal, no murmur, no rub/gallop auscultated. RRR. No lower extremity edema.   Musculoskeletal: Gait normal  Neurological: Normal balance/coordination. No tremor.  Skin: warm, dry    Psychiatric: Normal judgment/insight. Normal mood and affect. Oriented x3.    Recent Results (from the past 2160 hour(s))  ANA     Status: Abnormal   Collection Time: 12/15/17  1:35 PM  Result Value Ref Range   Anit Nuclear Antibody(ANA) POSITIVE (A) NEGATIVE    Comment: ANA IFA is a first line screen for detecting the presence of up to approximately 150 autoantibodies in various autoimmune diseases. A positive ANA IFA result is suggestive of autoimmune disease and reflexes to titer and pattern. Further laboratory testing may be considered if clinically indicated. . Visit Physician FAQs for interpretation of all antibodies  in the [place], prevalence, and association with diseases at http://education.[facility].com/ ZDG/LOV564 .   Anti-nuclear ab-titer (ANA titer)     Status: Abnormal   Collection Time: 12/15/17  1:35 PM  Result Value Ref Range   ANA Pattern 1 SPECKLED (A)     Comment: Speckled pattern is associated with mixed connective tissue disease (MCTD), systemic lupus erythematosus (SLE), Sjogren's syndrome, dermatomyositis, and systemic sclerosis/polymyositis overlap.    ANA Titer 1 1:40 (H) titer    Comment: A low level ANA titer may be present in pre-clinical autoimmune diseases and normal individuals.                 Reference Range                 <1:40        Negative                 1:40-1:80    Low Antibody Level                 >  1:80        Elevated Antibody Level .   Cytology - PAP     Status: None   Collection Time: 12/25/17 12:00 AM  Result Value Ref Range   Adequacy      Satisfactory for evaluation  endocervical/transformation zone component PRESENT.   Diagnosis      NEGATIVE FOR INTRAEPITHELIAL LESIONS OR MALIGNANCY.   Chlamydia Negative     Comment: Normal Reference Range - Negative   Neisseria gonorrhea Negative     Comment: Normal Reference Range - Negative   HPV NOT DETECTED     Comment: Normal Reference Range - NOT Detected   Material Submitted CervicoVaginal Pap [ThinPrep Imaged]    CYTOLOGY - PAP PAP RESULT   FSH     Status: None   Collection Time: 12/25/17  1:39 PM  Result Value Ref Range   FSH 10.3 mIU/mL    Comment:                     Reference Range .              Follicular Phase       8.6-76.1              Mid-cycle Peak         3.1-17.7              Luteal Phase           1.5- 9.1              Postmenopausal       23.0-116.3              .   CBC with Differential/Platelet     Status: Abnormal   Collection Time: 12/28/17 11:23 AM  Result Value Ref Range   WBC 12.4 (H) 3.8 - 10.8 Thousand/uL   RBC 4.89 3.80 - 5.10 Million/uL   Hemoglobin 15.3  11.7 - 15.5 g/dL   HCT 45.1 (H) 35.0 - 45.0 %   MCV 92.2 80.0 - 100.0 fL   MCH 31.3 27.0 - 33.0 pg   MCHC 33.9 32.0 - 36.0 g/dL   RDW 12.4 11.0 - 15.0 %   Platelets 143 140 - 400 Thousand/uL   MPV 11.0 7.5 - 12.5 fL   Neutro Abs 7,440 1,500 - 7,800 cells/uL   Lymphs Abs 3,546 850 - 3,900 cells/uL   WBC mixed population 1,252 (H) 200 - 950 cells/uL   Eosinophils Absolute 124 15 - 500 cells/uL   Basophils Absolute 37 0 - 200 cells/uL   Neutrophils Relative % 60 %   Total Lymphocyte 28.6 %   Monocytes Relative 10.1 %   Eosinophils Relative 1.0 %   Basophils Relative 0.3 %  COMPLETE METABOLIC PANEL WITH GFR     Status: Abnormal   Collection Time: 12/28/17 11:23 AM  Result Value Ref Range   Glucose, Bld 83 65 - 99 mg/dL    Comment: .            Fasting reference interval .    BUN 14 7 - 25 mg/dL   Creat 0.70 0.50 - 1.10 mg/dL   GFR, Est Non African American 108 > OR = 60 mL/min/1.45m2   GFR, Est African American 126 > OR = 60 mL/min/1.6m2   BUN/Creatinine Ratio NOT APPLICABLE 6 - 22 (calc)   Sodium 138 135 - 146 mmol/L   Potassium 3.6 3.5 - 5.3 mmol/L   Chloride 100 98 - 110  mmol/L   CO2 28 20 - 32 mmol/L   Calcium 9.4 8.6 - 10.2 mg/dL   Total Protein 7.8 6.1 - 8.1 g/dL   Albumin 4.7 3.6 - 5.1 g/dL   Globulin 3.1 1.9 - 3.7 g/dL (calc)   AG Ratio 1.5 1.0 - 2.5 (calc)   Total Bilirubin 1.7 (H) 0.2 - 1.2 mg/dL   Alkaline phosphatase (APISO) 62 33 - 115 U/L   AST 30 10 - 30 U/L   ALT 61 (H) 6 - 29 U/L  Urinalysis, Routine w reflex microscopic     Status: None   Collection Time: 12/28/17 11:23 AM  Result Value Ref Range   Color, Urine DARK YELLOW YELLOW   APPearance CLEAR CLEAR   Specific Gravity, Urine 1.025 1.001 - 1.03   pH < OR = 5.0 5.0 - 8.0   Glucose, UA NEGATIVE NEGATIVE   Bilirubin Urine NEGATIVE NEGATIVE   Ketones, ur NEGATIVE NEGATIVE   Hgb urine dipstick NEGATIVE NEGATIVE   Protein, ur NEGATIVE NEGATIVE   Nitrite NEGATIVE NEGATIVE   Leukocytes, UA NEGATIVE  NEGATIVE  CK     Status: None   Collection Time: 12/28/17 11:23 AM  Result Value Ref Range   Total CK 39 29 - 143 U/L  TSH     Status: Abnormal   Collection Time: 12/28/17 11:23 AM  Result Value Ref Range   TSH 5.18 (H) mIU/L    Comment:           Reference Range .           > or = 20 Years  0.40-4.50 .                Pregnancy Ranges           First trimester    0.26-2.66           Second trimester   0.55-2.73           Third trimester    0.43-2.91   Sedimentation rate     Status: None   Collection Time: 12/28/17 11:23 AM  Result Value Ref Range   Sed Rate 6 0 - 20 mm/h  VITAMIN D 25 Hydroxy (Vit-D Deficiency, Fractures)     Status: Abnormal   Collection Time: 12/28/17 11:23 AM  Result Value Ref Range   Vit D, 25-Hydroxy 13 (L) 30 - 100 ng/mL    Comment: Vitamin D Status         25-OH Vitamin D: . Deficiency:                    <20 ng/mL Insufficiency:             20 - 29 ng/mL Optimal:                 > or = 30 ng/mL . For 25-OH Vitamin D testing on patients on  D2-supplementation and patients for whom quantitation  of D2 and D3 fractions is required, the [facility]ure[facility](TM) 25-OH VIT D, (D2,D3), LC/MS/MS is recommended: order  code [phone] (patients >1yrs). . For more information on this test, go to: http://education.[facility].com/faq/FAQ163 (This link is being provided for  informational/educational purposes only.)   Rheumatoid factor     Status: None   Collection Time: 12/28/17 11:23 AM  Result Value Ref Range   Rhuematoid fact SerPl-aCnc <14 <14 IU/mL  Angiotensin converting enzyme     Status: None   Collection Time: 12/28/17 11:23 AM  Result Value Ref Range  Angiotensin-Converting Enzyme 42 9 - 67 U/L  Cyclic citrul peptide antibody, IgG     Status: None   Collection Time: 12/28/17 11:23 AM  Result Value Ref Range   Cyclic Citrullin Peptide Ab <16 UNITS    Comment: Reference Range Negative:            <20 Weak Positive:       20-39 Moderate  Positive:   40-59 Strong Positive:     >59 .   Anti-scleroderma antibody     Status: None   Collection Time: 12/28/17 11:23 AM  Result Value Ref Range   Scleroderma (Scl-70) (ENA) Antibody, IgG <1.0 NEG <1.0 NEG AI  RNP Antibody     Status: None   Collection Time: 12/28/17 11:23 AM  Result Value Ref Range   Ribonucleic Protein(ENA) Antibody, IgG <1.0 NEG <1.0 NEG AI  Anti-Smith antibody     Status: None   Collection Time: 12/28/17 11:23 AM  Result Value Ref Range   ENA SM Ab Ser-aCnc <1.0 NEG <1.0 NEG AI  Sjogrens syndrome-A extractable nuclear antibody     Status: None   Collection Time: 12/28/17 11:23 AM  Result Value Ref Range   SSA (Ro) (ENA) Antibody, IgG <1.0 NEG <1.0 NEG AI  Sjogrens syndrome-B extractable nuclear antibody     Status: None   Collection Time: 12/28/17 11:23 AM  Result Value Ref Range   SSB (La) (ENA) Antibody, IgG <1.0 NEG <1.0 NEG AI  Anti-DNA antibody, double-stranded     Status: None   Collection Time: 12/28/17 11:23 AM  Result Value Ref Range   ds DNA Ab <1 IU/mL    Comment:                            IU/mL       Interpretation                            < or = 4    Negative                            5-9         Indeterminate                            > or = 10   Positive .   C3 and C4     Status: None   Collection Time: 12/28/17 11:23 AM  Result Value Ref Range   C3 Complement 163 83 - 193 mg/dL   C4 Complement 23 15 - 57 mg/dL  Beta-2 glycoprotein antibodies     Status: None   Collection Time: 12/28/17 11:23 AM  Result Value Ref Range   Beta-2 Glyco I IgG <9 < OR = 20 SGU   Beta-2 Glyco 1 IgM <9 < OR = 20 SMU   Beta-2 Glyco 1 IgA <9 < OR = 20 SAU  Cardiolipin antibodies, IgG, IgM, IgA     Status: None   Collection Time: 12/28/17 11:23 AM  Result Value Ref Range   Anticardiolipin IgA <11 APL    Comment:     Value       Interpretation     -----       --------------     < or = 11   Negative     12 -  20     Indeterminate     21 - 80      Low to Medium Positive     >80         High Positive      Anticardiolipin IgG <14 GPL    Comment:     Value       Interpretation     -----       --------------     < or = 14   Negative     15 - 20     Indeterminate     21 - 80     Low to Medium Positive     >80         High Positive    Anticardiolipin IgM <12 MPL    Comment:     Value       Interpretation     -----       --------------     < or = 12   Negative     13 - 20     Indeterminate     21 - 80     Low to Medium Positive     >80         High Positive . The antiphospholipid antibody syndrome (APS) is a  clinical-pathologic correlation that includes a  clinical event (e.g. thrombosis, pregnancy loss,  thrombocytopenia) and persistent positive  antiphospholipid antibodies  (IgM or IgG ACA >40 MPL/GPL, IgM or IgG anti-b2GPI antibodies or a lupus anticoagulant). International  consensus guidelines for APS suggest waiting at least  12 weeks before retesting to confirm antibody  persistence.  The [employer]  [facility]aborating [facility] immunological classification  criteria for systemic lupus erythematosus (SLE)  include testing for isotype IgA, which has yet to be  incorporated into APS criteria. Low level  antiphospholipid antibodies may sometimes be detected  in the setting of infection, drug therapy or aging.   Lupus Anticoagulant Eval w/Reflex     Status: None   Collection Time: 12/28/17 11:23 AM  Result Value Ref Range   Lupus Anticoagulant Eval see note     Comment: A Lupus Anticoagulant is not detected. Marland Kitchen Reference Range:  Not Detected . http://education.[facility].com/faq/LupusAnticoag ------------------------------------------------------- . This interpretation is based on the following test results. Marland Kitchen    PTT LA SCREEN 30 <=40 sec   DRVVT MIX INTERPRETATION: Not Indicated    dRVVT Screen 39 <=45 sec  Glucose 6 phosphate dehydrogenase     Status: None   Collection Time: 12/28/17 11:23  AM  Result Value Ref Range   G-6PDH 18.6 7.0 - 20.5 U/g Hgb  Hepatitis B core antibody, IgM     Status: None   Collection Time: 12/28/17 11:23 AM  Result Value Ref Range   Hep B C IgM NON-REACTIVE NON-REACTI  Hepatitis B surface antigen     Status: None   Collection Time: 12/28/17 11:23 AM  Result Value Ref Range   Hepatitis B Surface Ag NON-REACTIVE NON-REACTI  Hepatitis C antibody     Status: None   Collection Time: 12/28/17 11:23 AM  Result Value Ref Range   Hepatitis C Ab NON-REACTIVE NON-REACTI   SIGNAL TO CUT-OFF 0.03 <1.00    ASSESSMENT/PLAN: The primary encounter diagnosis was Elevated TSH. Diagnoses of Fever, unspecified fever cause, Vitamin D deficiency, and Myalgia were also pertinent to this visit.   Extensive discussion about lab results. At this point, nothing gives a good explanation for her symptoms. Could be early onset  perimenopause, to be post viral fatigue syndrome, rheumatologic disorder seems unlikely given Leory Plowman negative workup but can certainly revisit this if symptoms are persisting in 6 months or so. At this point, will look further into thyroid abnormalities and will strongly consider diagnosis of fibromyalgia syndrome. We discussed trying at least daily use of anti-inflammatories, consider switch to Cymbalta, other medication options such as tricyclic antidepressants, anticonvulsant such as gabapentin/Lyrica. Advised patient I would not start on long-term steroids for her symptoms without the opinion of her rheumatologist.  Orders Placed This Encounter  Procedures  . TSH  . T4, free  . Thyroid peroxidase antibody         Visit summary with medication list and pertinent instructions was printed for patient to review. All questions at time of visit were answered - patient instructed to contact office with any additional concerns. ER/RTC precautions were reviewed with the patient.   Follow-up plan: Return for review labs and check on pain/fatigue/fevers  in 2-3 weeks, sooner if needed .  Note: Total time spent 40 minutes, greater than 50% of the visit was spent face-to-face counseling and coordinating care for the following: The encounter diagnosis was Elevated TSH.Marland Kitchen  Please note: voice recognition software was used to produce this document, and typos may escape review. Please contact Dr. Sheppard Coil for any needed clarifications.

## 2018-01-09 LAB — TSH: TSH: 3.56 m[IU]/L

## 2018-01-09 LAB — T4, FREE: FREE T4: 1 ng/dL (ref 0.8–1.8)

## 2018-01-09 LAB — THYROID PEROXIDASE ANTIBODY: Thyroperoxidase Ab SerPl-aCnc: <1 IU/mL (ref <9)

## 2018-01-10 ENCOUNTER — Ambulatory Visit (INDEPENDENT_AMBULATORY_CARE_PROVIDER_SITE_OTHER): Payer: 59 | Admitting: Physical Therapy

## 2018-01-10 ENCOUNTER — Encounter: Payer: Self-pay | Admitting: Osteopathic Medicine

## 2018-01-10 DIAGNOSIS — M25511 Pain in right shoulder: Secondary | ICD-10-CM

## 2018-01-10 DIAGNOSIS — R29898 Other symptoms and signs involving the musculoskeletal system: Secondary | ICD-10-CM

## 2018-01-10 DIAGNOSIS — R293 Abnormal posture: Secondary | ICD-10-CM | POA: Diagnosis not present

## 2018-01-10 NOTE — Patient Instructions (Signed)
Strengthening: Resisted Flexion    Hold tubing with Right arm at side. Pull forward and up. Move shoulder through pain-free range of motion.     (RED band or 2#)  Repeat __10__ times per set. Do __2__ sets per session. Do __3__ sessions per week.   Resisted External Rotation: in Neutral - Bilateral  PALMS UP!!! Sit or stand, tubing in both hands, elbows at sides, bent to 90, forearms forward. Pinch shoulder blades together and rotate forearms out. Keep elbows at sides. Repeat __10__ times per set. Do __2-3__ sets per session. Do __3-4__ sessions per week.  Resistive Band Rowing   With resistive band anchored in door, grasp both ends. Keeping elbows bent, pull back, squeezing shoulder blades together. Hold _3-5___ seconds. Repeat _10-30___ times. Do __1__ sessions per day.  (Home) PNF: D2 Flexion - Unilateral    Opposite side toward anchor, right arm down, across body, thumb down, pull arm up and out, rotating to thumb up. Follow hand with head and eyes. Repeat ___10_ times per set. Do _2___ sets per session. Do ___3_ sessions per week.   [facility] [place] [address] [place], [place] [address]  [phone] (office) [phone] (fax)
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
## 2018-01-15 NOTE — Telephone Encounter (Signed)
Error

## 2018-01-17 ENCOUNTER — Ambulatory Visit (INDEPENDENT_AMBULATORY_CARE_PROVIDER_SITE_OTHER): Payer: 59

## 2018-01-17 ENCOUNTER — Ambulatory Visit (INDEPENDENT_AMBULATORY_CARE_PROVIDER_SITE_OTHER): Payer: 59 | Admitting: Physical Therapy

## 2018-01-17 DIAGNOSIS — R293 Abnormal posture: Secondary | ICD-10-CM | POA: Diagnosis not present

## 2018-01-17 DIAGNOSIS — M25511 Pain in right shoulder: Secondary | ICD-10-CM

## 2018-01-17 DIAGNOSIS — Z1231 Encounter for screening mammogram for malignant neoplasm of breast: Secondary | ICD-10-CM | POA: Diagnosis not present

## 2018-01-17 DIAGNOSIS — Z01419 Encounter for gynecological examination (general) (routine) without abnormal findings: Secondary | ICD-10-CM

## 2018-01-17 DIAGNOSIS — R29898 Other symptoms and signs involving the musculoskeletal system: Secondary | ICD-10-CM

## 2018-01-17 NOTE — Therapy (Signed)
[place], [place], [address] Phone: [phone]   Fax:  [phone]  Physical Therapy Treatment  Patient Details  Name: Deborah Goodman MRN: [id number] Date of Birth: [birth year] Referring Provider: Dr Sheppard Coil   Encounter Date: 01/17/2018  PT End of Session - 01/17/18 1150    Visit Number  8    Number of Visits  20    Date for PT Re-Evaluation  02/21/18    PT Start Time  1101    PT Stop Time  1147    PT Time Calculation (min)  46 min    Activity Tolerance  Patient tolerated treatment well       Past Medical History:  Diagnosis Date  . Acid reflux   . Depression   . GAD (generalized anxiety disorder) 02/10/2017  . Hypertension 02/10/2017  . Palpitations 02/10/2017   a. 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   . SVT (supraventricular tachycardia) ([place])   . Systolic murmur   . Tobacco use     Past Surgical History:  Procedure Laterality Date  . SACRO-ILIAC PINNING  2016   fusion, in [place]    There were no vitals filed for this visit.  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     Patient Stated Goals  get rid of the shoulder pain, strengthen shoulder     Currently in Pain?  Yes    Pain Score  2     Pain Location  Shoulder    Pain Orientation  Right    Pain Descriptors / Indicators  Dull;Aching;Tightness    Pain Type  Chronic pain    Pain Onset  More than a month ago    Pain Frequency  Intermittent    Aggravating Factors   lifitng and using the arm alot    Pain Relieving Factors  medication         [facility] PT Assessment - 01/17/18 0001      Assessment   Medical Diagnosis  Rt shoulder dysfunction    Referring Provider  Dr Sheppard Coil    Next MD Visit  01/20/18                  [facility] Adult PT Treatment/Exercise - 01/17/18 0001      Exercises    Exercises  Shoulder      Shoulder Exercises: Supine   Internal Rotation  Strengthening;Right;Weights 3x8    Internal Rotation Weight (lbs)  1    Other Supine Exercises  20 reps, yellow band, overhead pull, horizontal abduction, ER.     Other Supine Exercises  10x5sec thoracic lifts      Shoulder Exercises: Prone   Other Prone Exercises  2x8 T's attempted with 1# to hard    Other Prone Exercises  2x8 row with 1#       Shoulder Exercises: Sidelying   External Rotation  AAROM;Right 2x8, 1x5 with towel under elbow    External Rotation Weight (lbs)  1      Shoulder Exercises: ROM/Strengthening   UBE (Upper Arm Bike)  L2x4' alt FWD/BWD      Shoulder Exercises: Stretch   Other Shoulder Stretches  chest stretch with strap in doorway, bilat arms straight and bent      Modalities   Modalities  Iontophoresis      Iontophoresis  Type of Iontophoresis  Dexamethasone    Location  Rt ant shoulder     Dose  1.0 cc    Time  80 mA; 6 hr patch       Manual Therapy   Manual Therapy  Joint mobilization    Joint Mobilization  Rt GH mobs AP and inferior grade III    Soft tissue mobilization  tight in Rt supraspinatus                  PT Long Term Goals - 01/17/18 1110      PT LONG TERM GOAL #1   Title  Improve posture and alignment with patient to demonstrate improved upright posture with posterior shoulder girdle engaged 02/21/18    Status  On-going      PT LONG TERM GOAL #2   Title  Increase AROM Rt shoulder to equal or greater than AROM Lt shoulder  painfree and good scapular control/movement 3/619    Status  On-going      PT LONG TERM GOAL #3   Title  Decrease pain by 75% allowing patient to perform functional and work activities with minimal to no difficulty 02/21/18    Status  On-going      PT LONG TERM GOAL #4   Title  Independent in HEP 02/21/18    Status  On-going      PT LONG TERM GOAL #5   Title  Improve FOTO to 30% limitation 02/21/18     Status  On-going             Plan - 01/17/18 1151    Clinical Impression Statement  This is Ryhanna's second visit since having a return of pain in her Rt shoulder.  She has tightness in her pecs and supraspinatus.  She fatigues quickly with sterngthening and has discomfort with stretching overhead.  Encouraged patient to continue with treatment as she will need time to get improvement.     Rehab Potential  Good    PT Frequency  2x / week    PT Duration  6 weeks    PT Treatment/Interventions  Patient/family education;ADLs/Self Care Home Management;Cryotherapy;Electrical Stimulation;Iontophoresis 4mg /ml Dexamethasone;Moist Heat;Ultrasound;Therapeutic activities;Therapeutic exercise;Dry needling;Manual techniques;Neuromuscular re-education    PT Next Visit Plan  conti ionto, RTC strengthening and chest stretching       Patient will benefit from skilled therapeutic intervention in order to improve the following deficits and impairments:  Postural dysfunction, Improper body mechanics, Pain, Decreased mobility, Decreased range of motion, Decreased activity tolerance, Decreased strength  Visit Diagnosis: Acute pain of right shoulder  Other symptoms and signs involving the musculoskeletal system  Abnormal posture     Problem List Patient Active Problem List   Diagnosis Date Noted  . Morbid obesity ([place]) 12/25/2017  . Family history of autoimmune disorder 12/18/2017  . Transaminitis 05/05/2017  . Lung nodule < 6cm on CT 05/05/2017  . Fever 04/27/2017  . PSVT (paroxysmal supraventricular tachycardia) ([place]) 03/13/2017  . Tobacco use 03/13/2017  . Palpitations 02/10/2017  . GAD (generalized anxiety disorder) 02/10/2017  . Hypertension 02/10/2017  . Abnormal thyroid blood test 02/10/2017    Jeral Pinch PT  01/17/2018, 11:54 AM  [facility] [place], [place], [address] Phone: [phone]   Fax:  [phone]  Name: TINSLEY EVERMAN MRN: [id number] Date of Birth: 20-Feb-1976

## 2018-01-19 DIAGNOSIS — J189 Pneumonia, unspecified organism: Secondary | ICD-10-CM

## 2018-01-19 HISTORY — DX: Pneumonia, unspecified organism: J18.9

## 2018-01-22 ENCOUNTER — Ambulatory Visit (INDEPENDENT_AMBULATORY_CARE_PROVIDER_SITE_OTHER): Payer: 59 | Admitting: Physical Therapy

## 2018-01-22 ENCOUNTER — Ambulatory Visit (INDEPENDENT_AMBULATORY_CARE_PROVIDER_SITE_OTHER): Payer: 59 | Admitting: Osteopathic Medicine

## 2018-01-22 ENCOUNTER — Encounter: Payer: Self-pay | Admitting: Osteopathic Medicine

## 2018-01-22 ENCOUNTER — Encounter: Payer: Self-pay | Admitting: Physical Therapy

## 2018-01-22 VITALS — BP 133/83 | HR 73 | Temp 98.1°F | Wt 184.9 lb

## 2018-01-22 DIAGNOSIS — M25511 Pain in right shoulder: Secondary | ICD-10-CM | POA: Diagnosis not present

## 2018-01-22 DIAGNOSIS — R29898 Other symptoms and signs involving the musculoskeletal system: Secondary | ICD-10-CM | POA: Diagnosis not present

## 2018-01-22 DIAGNOSIS — R61 Generalized hyperhidrosis: Secondary | ICD-10-CM

## 2018-01-22 DIAGNOSIS — R293 Abnormal posture: Secondary | ICD-10-CM | POA: Diagnosis not present

## 2018-01-22 DIAGNOSIS — M797 Fibromyalgia: Secondary | ICD-10-CM

## 2018-01-22 MED ORDER — METOPROLOL SUCCINATE ER 50 MG PO TB24: 50.0000 mg | ORAL_TABLET | Freq: Every day | ORAL | 3 refills | Status: DC

## 2018-01-22 MED ORDER — DULOXETINE HCL 30 MG PO CPEP: 30.0000 mg | ORAL_CAPSULE | Freq: Every day | ORAL | 3 refills | Status: DC

## 2018-01-22 NOTE — Progress Notes (Signed)
HPI: Deborah Goodman is a 42 y.o. female who  has a past medical history of Acid reflux, Depression, GAD (generalized anxiety disorder) (02/10/2017), Hypertension (02/10/2017), Palpitations (02/10/2017), SVT (supraventricular tachycardia) ([place]), Systolic murmur, and Tobacco use.  she presents to [facility] today, 01/22/18,  for chief complaint of: Discuss recent lab results, fatigue, muscle aches/pains, night sweats/fever  Last visit 01/08/18:   "Ever since getting sick in 04/2017 with EBV, has experienced on and off muscle pains, significant fatigue, fevers/night sweats several days out of the week at least with fevers measured up to 101. She hasn't really kept a diary of these instances. We prescribed a steroid pack for shoulder pain and she states that on steroids she felt dramatically better. ANA has been positive, we sent to rheumatology - note reviewed, recent telephone note states workup was negative and patient could cancel follow-up appointment if desired."   We reviewed labs at last visit, A/P as follows: "Extensive discussion about lab results. At this point, nothing gives a good explanation for her symptoms. Could be early onset perimenopause, post viral fatigue syndrome? A rheumatologic disorder seems unlikely given negative workup but can certainly revisit this if symptoms are persisting in another 6 months or so. At this point, will look further into thyroid abnormalities and will strongly consider diagnosis of fibromyalgia syndrome. We discussed trying at least daily use of anti-inflammatories, consider switch to Cymbalta, other medication options such as tricyclic antidepressants, anticonvulsant such as gabapentin/Lyrica. Advised patient I would not start on long-term steroids for her symptoms without the opinion of her rheumatologist."   Today 01/22/18: Reviewed diagnostic criteria for FM: WPI score: 9 SS part 2a score: 5 SS part 2b score:  2 Total for part 2: 7  Plus:  Symptoms 3 mos - yes No other disorder which would explain diagnosis - yes  C/W FM Dx  Waking up with fevers - sheets are drenched, temp about 101 when this happens. Has happened 3 times since last saw me.      Past medical, surgical, social and family history reviewed:  Patient Active Problem List   Diagnosis Date Noted  . Morbid obesity ([place]) 12/25/2017  . Family history of autoimmune disorder 12/18/2017  . Transaminitis 05/05/2017  . Lung nodule < 6cm on CT 05/05/2017  . Fever 04/27/2017  . PSVT (paroxysmal supraventricular tachycardia) ([place]) 03/13/2017  . Tobacco use 03/13/2017  . Palpitations 02/10/2017  . GAD (generalized anxiety disorder) 02/10/2017  . Hypertension 02/10/2017  . Abnormal thyroid blood test 02/10/2017    Past Surgical History:  Procedure Laterality Date  . SACRO-ILIAC PINNING  2016   fusion, in [place] History   Tobacco Use  . Smoking status: Current Every Day Smoker    Packs/day: 0.50    Years: 22.00    Pack years: 11.00    Types: Cigarettes  . Smokeless tobacco: Never Used  Substance Use Topics  . Alcohol use: Yes    Family History  Problem Relation Age of Onset  . Depression Mother   . Hypertension Mother   . Diabetes Father   . Arrhythmia Sister        palpitations  . Cancer Maternal Grandfather        COLON  . Diabetes Paternal Grandfather      Current medication list and allergy/intolerance information reviewed:    Current Outpatient Medications  Medication Sig Dispense Refill  . albuterol (PROVENTIL HFA;VENTOLIN HFA) 108 (90 Base) MCG/ACT inhaler Inhale  2 puffs into the lungs every 6 (six) hours as needed for wheezing. 2 Inhaler 11  . B Complex-C (B-COMPLEX WITH VITAMIN C) tablet Take 1 tablet by mouth daily.    . cholecalciferol (VITAMIN D) 1000 units tablet Take 1,000 Units by mouth daily.    . ciprofloxacin (CILOXAN) 0.3 % ophthalmic solution INSTILL 2 DROPS INTO RIGHT EYE  EVERY 2 HOURS AS NEEDED WHILE AWAKE  0  . desvenlafaxine (PRISTIQ) 50 MG 24 hr tablet Take 1 tablet (50 mg total) by mouth daily. 90 tablet 3  . ipratropium (ATROVENT) 0.06 % nasal spray Place 2 sprays into both nostrils 4 (four) times daily. (Patient taking differently: Place 2 sprays into both nostrils as needed. ) 15 mL 12  . meloxicam (MOBIC) 15 MG tablet Take 1 tablet (15 mg total) by mouth daily. Pt needs to keep upcoming appt w/pcp. 30 tablet 0  . metoprolol (LOPRESSOR) 50 MG tablet Take 1 tablet (50 mg total) by mouth 2 (two) times daily. 60 tablet 6  . Multiple Vitamin (MULTIVITAMIN WITH MINERALS) TABS tablet Take 1 tablet by mouth daily.    . Omega-3 Fatty Acids (FISH OIL PO) Take 1 capsule by mouth daily.    . predniSONE (STERAPRED UNI-PAK 48 TAB) 10 MG (48) TBPK tablet 12 DAY TAPER, SEE PACKAGE INSTRUCTIONS  0  . Vitamin D, Ergocalciferol, (DRISDOL) 50000 units CAPS capsule Take 1 capsule (50,000 Units total) by mouth 2 (two) times a week. (Patient not taking: Reported on 01/08/2018) 24 capsule 0   No current facility-administered medications for this visit.     Allergies  Allergen Reactions  . Fish Allergy Nausea And Vomiting    ALL SEAFOOD  . Shellfish Allergy Nausea And Vomiting    ALL SEAFOOD  . Sulfa Antibiotics Other (See Comments)    Thrush reaction      Review of Systems:  Constitutional:  +fever, +chills as per HPI, No recent illness, No unintentional weight changes. +significant fatigue.   HEENT: No  headache, no vision change  Cardiac: No  chest pain, No  pressure, No palpitations  Respiratory:  No  shortness of breath. No  Cough  Gastrointestinal: No  abdominal pain, No  nause  Musculoskeletal: +myalgia/arthralgia  Skin: No  Rash, +raynaud's  Neurologic: No  weakness, No  dizziness  Psychiatric: No  concerns with depression, No  concerns with anxiety, No sleep problems, No mood problems   Exam:  BP 133/83   Pulse 73   Temp 98.1 F (36.7 C) (Oral)    Wt 184 lb 14.4 oz (83.9 kg)   LMP 01/06/2018   BMI 29.84 kg/m   Constitutional: VS see above. General Appearance: alert, well-developed, well-nourished, NAD  Eyes: Normal lids and conjunctive, non-icteric sclera  Ears, Nose, Mouth, Throat: MMM, Normal external inspection ears/nares/mouth/lips/gums.   Neck: No masses, trachea midline.   Respiratory: Normal respiratory effort. no wheeze, no rhonchi, no rales  Cardiovascular: S1/S2 normal, no murmur, no rub/gallop auscultated. RRR. No lower extremity edema.   Musculoskeletal: Gait normal  Neurological: Normal balance/coordination. No tremor.  Skin: warm, dry    Psychiatric: Normal judgment/insight. Normal mood and affect. Oriented x3.    Recent Results (from the past 2160 hour(s))  ANA     Status: Abnormal   Collection Time: 12/15/17  1:35 PM  Result Value Ref Range   Anit Nuclear Antibody(ANA) POSITIVE (A) NEGATIVE    Comment: ANA IFA is a first line screen for detecting the presence of up to approximately 150 autoantibodies  in various autoimmune diseases. A positive ANA IFA result is suggestive of autoimmune disease and reflexes to titer and pattern. Further laboratory testing may be considered if clinically indicated. . Visit Physician FAQs for interpretation of all antibodies in the [place], prevalence, and association with diseases at http://education.[facility].com/ ZDG/[email or web address] .   Anti-nuclear ab-titer (ANA titer)     Status: Abnormal   Collection Time: 12/15/17  1:35 PM  Result Value Ref Range   ANA Pattern 1 SPECKLED (A)     Comment: Speckled pattern is associated with mixed connective tissue disease (MCTD), systemic lupus erythematosus (SLE), Sjogren's syndrome, dermatomyositis, and systemic sclerosis/polymyositis overlap.    ANA Titer 1 1:40 (H) titer    Comment: A low level ANA titer may be present in pre-clinical autoimmune diseases and normal individuals.                 Reference Range                  <1:40        Negative                 1:40-1:80    Low Antibody Level                 >1:80        Elevated Antibody Level .   Cytology - PAP     Status: None   Collection Time: 12/25/17 12:00 AM  Result Value Ref Range   Adequacy      Satisfactory for evaluation  endocervical/transformation zone component PRESENT.   Diagnosis      NEGATIVE FOR INTRAEPITHELIAL LESIONS OR MALIGNANCY.   Chlamydia Negative     Comment: Normal Reference Range - Negative   Neisseria gonorrhea Negative     Comment: Normal Reference Range - Negative   HPV NOT DETECTED     Comment: Normal Reference Range - NOT Detected   Material Submitted CervicoVaginal Pap [ThinPrep Imaged]    CYTOLOGY - PAP PAP RESULT   FSH     Status: None   Collection Time: 12/25/17  1:39 PM  Result Value Ref Range   FSH 10.3 mIU/mL    Comment:                     Reference Range .              Follicular Phase       3.3-29.5              Mid-cycle Peak         3.1-17.7              Luteal Phase           1.5- 9.1              Postmenopausal       23.0-116.3              .   CBC with Differential/Platelet     Status: Abnormal   Collection Time: 12/28/17 11:23 AM  Result Value Ref Range   WBC 12.4 (H) 3.8 - 10.8 Thousand/uL   RBC 4.89 3.80 - 5.10 Million/uL   Hemoglobin 15.3 11.7 - 15.5 g/dL   HCT 45.1 (H) 35.0 - 45.0 %   MCV 92.2 80.0 - 100.0 fL   MCH 31.3 27.0 - 33.0 pg   MCHC 33.9 32.0 - 36.0 g/dL   RDW 12.4 11.0 - 15.0 %   Platelets 143  140 - 400 Thousand/uL   MPV 11.0 7.5 - 12.5 fL   Neutro Abs 7,440 1,500 - 7,800 cells/uL   Lymphs Abs 3,546 850 - 3,900 cells/uL   WBC mixed population 1,252 (H) 200 - 950 cells/uL   Eosinophils Absolute 124 15 - 500 cells/uL   Basophils Absolute 37 0 - 200 cells/uL   Neutrophils Relative % 60 %   Total Lymphocyte 28.6 %   Monocytes Relative 10.1 %   Eosinophils Relative 1.0 %   Basophils Relative 0.3 %  COMPLETE METABOLIC PANEL WITH GFR     Status: Abnormal    Collection Time: 12/28/17 11:23 AM  Result Value Ref Range   Glucose, Bld 83 65 - 99 mg/dL    Comment: .            Fasting reference interval .    BUN 14 7 - 25 mg/dL   Creat 0.70 0.50 - 1.10 mg/dL   GFR, Est Non African American 108 > OR = 60 mL/min/1.5m2   GFR, Est African American 126 > OR = 60 mL/min/1.34m2   BUN/Creatinine Ratio NOT APPLICABLE 6 - 22 (calc)   Sodium 138 135 - 146 mmol/L   Potassium 3.6 3.5 - 5.3 mmol/L   Chloride 100 98 - 110 mmol/L   CO2 28 20 - 32 mmol/L   Calcium 9.4 8.6 - 10.2 mg/dL   Total Protein 7.8 6.1 - 8.1 g/dL   Albumin 4.7 3.6 - 5.1 g/dL   Globulin 3.1 1.9 - 3.7 g/dL (calc)   AG Ratio 1.5 1.0 - 2.5 (calc)   Total Bilirubin 1.7 (H) 0.2 - 1.2 mg/dL   Alkaline phosphatase (APISO) 62 33 - 115 U/L   AST 30 10 - 30 U/L   ALT 61 (H) 6 - 29 U/L  Urinalysis, Routine w reflex microscopic     Status: None   Collection Time: 12/28/17 11:23 AM  Result Value Ref Range   Color, Urine DARK YELLOW YELLOW   APPearance CLEAR CLEAR   Specific Gravity, Urine 1.025 1.001 - 1.03   pH < OR = 5.0 5.0 - 8.0   Glucose, UA NEGATIVE NEGATIVE   Bilirubin Urine NEGATIVE NEGATIVE   Ketones, ur NEGATIVE NEGATIVE   Hgb urine dipstick NEGATIVE NEGATIVE   Protein, ur NEGATIVE NEGATIVE   Nitrite NEGATIVE NEGATIVE   Leukocytes, UA NEGATIVE NEGATIVE  CK     Status: None   Collection Time: 12/28/17 11:23 AM  Result Value Ref Range   Total CK 39 29 - 143 U/L  TSH     Status: Abnormal   Collection Time: 12/28/17 11:23 AM  Result Value Ref Range   TSH 5.18 (H) mIU/L    Comment:           Reference Range .           > or = 20 Years  0.40-4.50 .                Pregnancy Ranges           First trimester    0.26-2.66           Second trimester   0.55-2.73           Third trimester    0.43-2.91   Sedimentation rate     Status: None   Collection Time: 12/28/17 11:23 AM  Result Value Ref Range   Sed Rate 6 0 - 20 mm/h  VITAMIN D 25 Hydroxy (Vit-D Deficiency, Fractures)  Status: Abnormal   Collection Time: 12/28/17 11:23 AM  Result Value Ref Range   Vit D, 25-Hydroxy 13 (L) 30 - 100 ng/mL    Comment: Vitamin D Status         25-OH Vitamin D: . Deficiency:                    <20 ng/mL Insufficiency:             20 - 29 ng/mL Optimal:                 > or = 30 ng/mL . For 25-OH Vitamin D testing on patients on  D2-supplementation and patients for whom quantitation  of D2 and D3 fractions is required, the [facility](TM) 25-OH VIT D, (D2,D3), LC/MS/MS is recommended: order  code [id number] (patients >40yrs). . For more information on this test, go to: http://education.[facility].com/faq/FAQ163 (This link is being provided for  informational/educational purposes only.)   Rheumatoid factor     Status: None   Collection Time: 12/28/17 11:23 AM  Result Value Ref Range   Rhuematoid fact SerPl-aCnc <14 <14 IU/mL  Angiotensin converting enzyme     Status: None   Collection Time: 12/28/17 11:23 AM  Result Value Ref Range   Angiotensin-Converting Enzyme 42 9 - 67 U/L  Cyclic citrul peptide antibody, IgG     Status: None   Collection Time: 12/28/17 11:23 AM  Result Value Ref Range   Cyclic Citrullin Peptide Ab <16 UNITS    Comment: Reference Range Negative:            <20 Weak Positive:       20-39 Moderate Positive:   40-59 Strong Positive:     >59 .   Anti-scleroderma antibody     Status: None   Collection Time: 12/28/17 11:23 AM  Result Value Ref Range   Scleroderma (Scl-70) (ENA) Antibody, IgG <1.0 NEG <1.0 NEG AI  RNP Antibody     Status: None   Collection Time: 12/28/17 11:23 AM  Result Value Ref Range   Ribonucleic Protein(ENA) Antibody, IgG <1.0 NEG <1.0 NEG AI  Anti-Smith antibody     Status: None   Collection Time: 12/28/17 11:23 AM  Result Value Ref Range   ENA SM Ab Ser-aCnc <1.0 NEG <1.0 NEG AI  Sjogrens syndrome-A extractable nuclear antibody     Status: None   Collection Time: 12/28/17 11:23 AM  Result Value Ref Range   SSA  (Ro) (ENA) Antibody, IgG <1.0 NEG <1.0 NEG AI  Sjogrens syndrome-B extractable nuclear antibody     Status: None   Collection Time: 12/28/17 11:23 AM  Result Value Ref Range   SSB (La) (ENA) Antibody, IgG <1.0 NEG <1.0 NEG AI  Anti-DNA antibody, double-stranded     Status: None   Collection Time: 12/28/17 11:23 AM  Result Value Ref Range   ds DNA Ab <1 IU/mL    Comment:                            IU/mL       Interpretation                            < or = 4    Negative                            5-9  Indeterminate                            > or = 10   Positive .   C3 and C4     Status: None   Collection Time: 12/28/17 11:23 AM  Result Value Ref Range   C3 Complement 163 83 - 193 mg/dL   C4 Complement 23 15 - 57 mg/dL  Beta-2 glycoprotein antibodies     Status: None   Collection Time: 12/28/17 11:23 AM  Result Value Ref Range   Beta-2 Glyco I IgG <9 < OR = 20 SGU   Beta-2 Glyco 1 IgM <9 < OR = 20 SMU   Beta-2 Glyco 1 IgA <9 < OR = 20 SAU  Cardiolipin antibodies, IgG, IgM, IgA     Status: None   Collection Time: 12/28/17 11:23 AM  Result Value Ref Range   Anticardiolipin IgA <11 APL    Comment:     Value       Interpretation     -----       --------------     < or = 11   Negative     12 - 20     Indeterminate     21 - 80     Low to Medium Positive     >80         High Positive      Anticardiolipin IgG <14 GPL    Comment:     Value       Interpretation     -----       --------------     < or = 14   Negative     15 - 20     Indeterminate     21 - 80     Low to Medium Positive     >80         High Positive    Anticardiolipin IgM <12 MPL    Comment:     Value       Interpretation     -----       --------------     < or = 12   Negative     13 - 20     Indeterminate     21 - 80     Low to Medium Positive     >80         High Positive . The antiphospholipid antibody syndrome (APS) is a  clinical-pathologic correlation that includes a  clinical event (e.g.  thrombosis, pregnancy loss,  thrombocytopenia) and persistent positive  antiphospholipid antibodies  (IgM or IgG ACA >40 MPL/GPL, IgM or IgG anti-b2GPI antibodies or a lupus anticoagulant). International  consensus guidelines for APS suggest waiting at least  12 weeks before retesting to confirm antibody  persistence.  The [employer]  [facility] immunological classification  criteria for systemic lupus erythematosus (SLE)  include testing for isotype IgA, which has yet to be  incorporated into APS criteria. Low level  antiphospholipid antibodies may sometimes be detected  in the setting of infection, drug therapy or aging.   Lupus Anticoagulant Eval w/Reflex     Status: None   Collection Time: 12/28/17 11:23 AM  Result Value Ref Range   Lupus Anticoagulant Eval see note     Comment: A Lupus Anticoagulant is not detected. Marland Kitchen Reference Range:  Not Detected . http://education.[facility].com/faq/LupusAnticoag ------------------------------------------------------- . This interpretation is based on the  following test results. Marland Kitchen    PTT LA SCREEN 30 <=40 sec   DRVVT MIX INTERPRETATION: Not Indicated    dRVVT Screen 39 <=45 sec  Glucose 6 phosphate dehydrogenase     Status: None   Collection Time: 12/28/17 11:23 AM  Result Value Ref Range   G-6PDH 18.6 7.0 - 20.5 U/g Hgb  Hepatitis B core antibody, IgM     Status: None   Collection Time: 12/28/17 11:23 AM  Result Value Ref Range   Hep B C IgM NON-REACTIVE NON-REACTI  Hepatitis B surface antigen     Status: None   Collection Time: 12/28/17 11:23 AM  Result Value Ref Range   Hepatitis B Surface Ag NON-REACTIVE NON-REACTI  Hepatitis C antibody     Status: None   Collection Time: 12/28/17 11:23 AM  Result Value Ref Range   Hepatitis C Ab NON-REACTIVE NON-REACTI   SIGNAL TO CUT-OFF 0.03 <1.00  TSH     Status: None   Collection Time: 01/08/18  4:13 PM  Result Value Ref Range   TSH 3.56 mIU/L     Comment:           Reference Range .           > or = 20 Years  0.40-4.50 .                Pregnancy Ranges           First trimester    0.26-2.66           Second trimester   0.55-2.73           Third trimester    0.43-2.91   T4, free     Status: None   Collection Time: 01/08/18  4:13 PM  Result Value Ref Range   Free T4 1.0 0.8 - 1.8 ng/dL  Thyroid peroxidase antibody     Status: None   Collection Time: 01/08/18  4:13 PM  Result Value Ref Range   Thyroperoxidase Ab SerPl-aCnc <1 <9 IU/mL       ASSESSMENT/PLAN: The primary encounter diagnosis was Fibromyalgia. A diagnosis of Unexplained night sweats was also pertinent to this visit.  Rheumatologic workup negative. Her current antidepressants do have a 1% chance of causing night sweats/hot flashes. Recent testing also for her menopausal state showed normal FSH. Given lack of other positive inflammatory markers, no other concern at this point for infection, I think it's more hot flashes as opposed to dangerous night sweats or other concerning symptom. May also be medication side effect. We are going to go ahead and switch to Cymbalta in addition to the NSAIDs, which have been helping her pain issues. Printed additional up-to-date information for fibromyalgia. We'll see how she does on the medicines after few weeks          Visit summary with medication list and pertinent instructions was printed for patient to review. All questions at time of visit were answered - patient instructed to contact office with any additional concerns. ER/RTC precautions were reviewed with the patient.   Follow-up plan: Return for recheck fibromyalgia in cymbalta in 4-6 weeks .  Note: Total time spent 25 minutes, greater than 50% of the visit was spent face-to-face counseling and coordinating care for the following: There were no encounter diagnoses..  Please note: voice recognition software was used to produce this document, and typos may escape  review. Please contact Dr. Sheppard Coil for any needed clarifications.
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
## 2018-01-25 ENCOUNTER — Encounter: Payer: 59 | Admitting: Physical Therapy

## 2018-02-06 ENCOUNTER — Ambulatory Visit: Payer: Self-pay | Admitting: Rheumatology

## 2018-02-12 ENCOUNTER — Ambulatory Visit (INDEPENDENT_AMBULATORY_CARE_PROVIDER_SITE_OTHER): Payer: 59 | Admitting: Rehabilitative and Restorative Service Providers"

## 2018-02-12 DIAGNOSIS — R293 Abnormal posture: Secondary | ICD-10-CM

## 2018-02-12 DIAGNOSIS — M25511 Pain in right shoulder: Secondary | ICD-10-CM

## 2018-02-12 DIAGNOSIS — R29898 Other symptoms and signs involving the musculoskeletal system: Secondary | ICD-10-CM
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
## 2018-02-12 NOTE — Patient Instructions (Signed)

## 2018-02-14 ENCOUNTER — Encounter: Payer: 59 | Admitting: Physical Therapy

## 2018-02-18 ENCOUNTER — Other Ambulatory Visit: Payer: Self-pay | Admitting: Osteopathic Medicine

## 2018-02-19 ENCOUNTER — Ambulatory Visit (INDEPENDENT_AMBULATORY_CARE_PROVIDER_SITE_OTHER): Payer: 59 | Admitting: Rehabilitative and Restorative Service Providers"

## 2018-02-19 ENCOUNTER — Encounter: Payer: Self-pay | Admitting: Rehabilitative and Restorative Service Providers"

## 2018-02-19 DIAGNOSIS — R29898 Other symptoms and signs involving the musculoskeletal system: Secondary | ICD-10-CM | POA: Diagnosis not present

## 2018-02-19 DIAGNOSIS — M25511 Pain in right shoulder: Secondary | ICD-10-CM | POA: Diagnosis not present

## 2018-02-19 DIAGNOSIS — R293 Abnormal posture: Secondary | ICD-10-CM

## 2018-02-19 MED FILL — METOPROLOL SUCCINATE ER 50: 50 | 90 days supply | Qty: 90 | Fill #0

## 2018-02-19 MED FILL — MELOXICAM 15 MG TABLET: 15 | 30 days supply | Qty: 30 | Fill #0

## 2018-02-19 MED FILL — DULoxetine HCL 30 MG CPEP: 30 | 30 days supply | Qty: 30 | Fill #0
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
## 2018-02-21 ENCOUNTER — Encounter: Payer: Self-pay | Admitting: Osteopathic Medicine

## 2018-02-26 ENCOUNTER — Ambulatory Visit: Payer: 59 | Admitting: Osteopathic Medicine

## 2018-02-27 ENCOUNTER — Encounter: Payer: Self-pay | Admitting: Rehabilitative and Restorative Service Providers"

## 2018-02-27 ENCOUNTER — Ambulatory Visit (INDEPENDENT_AMBULATORY_CARE_PROVIDER_SITE_OTHER): Payer: 59 | Admitting: Rehabilitative and Restorative Service Providers"

## 2018-02-27 DIAGNOSIS — R29898 Other symptoms and signs involving the musculoskeletal system: Secondary | ICD-10-CM

## 2018-02-27 DIAGNOSIS — R293 Abnormal posture: Secondary | ICD-10-CM | POA: Diagnosis not present

## 2018-02-27 DIAGNOSIS — M25511 Pain in right shoulder: Secondary | ICD-10-CM
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
## 2018-03-12 ENCOUNTER — Encounter: Payer: 59 | Admitting: Rehabilitative and Restorative Service Providers"

## 2018-03-13 ENCOUNTER — Encounter: Payer: Self-pay | Admitting: Family Medicine

## 2018-03-13 ENCOUNTER — Encounter: Payer: Self-pay | Admitting: Osteopathic Medicine

## 2018-03-13 DIAGNOSIS — M25511 Pain in right shoulder: Principal | ICD-10-CM

## 2018-03-13 DIAGNOSIS — G8929 Other chronic pain: Secondary | ICD-10-CM

## 2018-03-16 ENCOUNTER — Ambulatory Visit (INDEPENDENT_AMBULATORY_CARE_PROVIDER_SITE_OTHER): Payer: 59 | Admitting: Family Medicine

## 2018-03-16 ENCOUNTER — Encounter: Payer: Self-pay | Admitting: Family Medicine

## 2018-03-16 DIAGNOSIS — M7541 Impingement syndrome of right shoulder: Secondary | ICD-10-CM

## 2018-03-16 MED ORDER — DICLOFENAC SODIUM 1 % TD GEL: 2.0000 g | Freq: Four times a day (QID) | TRANSDERMAL | 11 refills | Status: DC

## 2018-03-16 MED FILL — DICLOFENAC SODIUM 1% GEL: 1 | 13 days supply | Qty: 100 | Fill #0

## 2018-03-16 NOTE — Patient Instructions (Addendum)
Thank you for coming in today. Try the diclofenac gel up to 4x daily for pain.  Continue PT exercises.  Recheck with me in 4-6 weeks.  Hold on that MRI until recheck.   Call or go to the ER if you develop a large red swollen joint with extreme pain or oozing puss.

## 2018-03-16 NOTE — Progress Notes (Signed)
Subjective:    I'm seeing this patient as a consultation for:  Deborah Reeve, DO   CC: Right shoulder pain  HPI: Hae has a 32-month history of right shoulder pain.  The pain occurred after she was throwing a football.  She does not recall any specific injury but notes that later that day she had diffuse shoulder and upper arm pain.  The pain has been persistently bothersome.  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  Additionally she notes pain with overhead motion, reaching back, and at bedtime.  She denies any radiating pain to her arm.  She is been seen by her PCP and had a relatively normal shoulder x-ray in November 2018.  Additionally she is been attending physical therapy since January 23 of 2019 and been doing a physician directed home exercise program for longer than 6 weeks.  She notes the pain is persistently bothersome.  She is here today to discuss possibility of MRI versus injection.  She notes that it would be difficult right now to have shoulder surgery due to her job demands.   Past medical history, Surgical history, Family history not pertinant except as noted below, Social history, Allergies, and medications have been entered into the medical record, reviewed, and no changes needed.   Review of Systems: No headache, visual changes, nausea, vomiting, diarrhea, constipation, dizziness, abdominal pain, skin rash, fevers, chills, night sweats, weight loss, swollen lymph nodes, body aches, joint swelling, muscle aches, chest pain, shortness of breath, mood changes, visual or auditory hallucinations.   Objective:    Vitals:   03/16/18 0808  BP: 129/78  Pulse: 74   General: Well Developed, well nourished, and in no acute distress.  Neuro/Psych: Alert and oriented x3, extra-ocular muscles intact, able to move all 4 extremities, sensation grossly intact. Skin: Warm and dry,  no rashes noted.  Respiratory: Not using accessory muscles, speaking in full sentences, trachea midline.  Cardiovascular: Pulses palpable, no extremity edema. Abdomen: Does not appear distended. MSK:  C-spine: Nontender to spinal midline normal motion.  Nontender trapezius. Right shoulder: Normal-appearing no erythema or effusion. Range of motion: Normal abduction and forward flexion but pain beyond 120 degrees. Nontender at the acromial clavicular joint.  Minimally tender biceps tendon groove. Normal external rotation. Internal rotation limited to the lumbar spine compared to thoracic spine contralateral left shoulder. Strength is intact to abduction external and internal rotation but painful with abduction and external rotation. Positive empty can test.  Positive Hawkins and Neer's test. Negative Yergason's and speeds test. Positive crossover arm compression test. Negative anterior apprehension test and negative clunk or grind test.  Left shoulder: Normal-appearing Normal range of motion Normal strength Negative impingement testing Negative labrum testing  Pulses capillary refill and sensation are intact bilateral upper extremities  Procedure: Real-time Ultrasound Guided Injection of right subacromial bursa Device: GE Logiq E   Images permanently stored and available for review in the ultrasound unit. Verbal informed consent obtained.  Discussed risks and benefits of procedure. Warned about infection bleeding damage to structures skin hypopigmentation and fat atrophy among others. Patient expresses understanding and agreement Time-out conducted.   Noted no overlying erythema, induration, or other signs of local infection.   Skin prepped in a sterile fashion.   Local anesthesia: Topical Ethyl chloride.   With sterile technique and under real time ultrasound guidance:  3 mL of Marcaine and 40  mg of Kenalog injected easily.   Completed without difficulty   Pain partially resolved  suggesting accurate placement of the medication.   Advised to call if fevers/chills, erythema, induration, drainage, or persistent bleeding.   Images permanently stored and available for review in the ultrasound unit.  Impression: Technically successful ultrasound guided injection.      Limited musculoskeletal ultrasound of the right shoulder reveals a normal-appearing biceps tendon in the groove. Normal subscapularis tendon. Supraspinatus tendon has areas of hypoechoic change and hyperechoic change with no obvious tear.   Subacromial bursa is enlarged Infraspinatus tendon is normal-appearing Acromial clavicular joint appears degenerative with effusion. Bony structures otherwise normal Impression: Subacromial bursitis and supraspinatus impingement and supraspinatus tendinopathy without obvious tear.  EXAM: RIGHT SHOULDER - 2+ VIEW  COMPARISON:  None  FINDINGS: Osseous demineralization.  AC joint alignment normal.  No acute fracture, dislocation or bone destruction.  Visualized RIGHT ribs intact.  IMPRESSION: No acute abnormalities.   Electronically Signed   By: Lavonia Dana M.D.   On: 11/07/2017 20:32 I personally (independently) visualized and performed the interpretation of the images attached in this note.   Impression and Recommendations:    Assessment and Plan: 42 y.o. female with  Persistent right shoulder pain for over a year failing typical conservative management. Diagnostic and therapeutic subacromial bursa injection had partial pain response.  Plan to continue home exercise program, physical therapy exercises, and will try diclofenac gel as well.  Recheck in 6 weeks.  If not better next step would be MRI. As patient does not have instability or labral signs I do not think that MRI arthrogram is necessary.  Health maintenance updated. No orders of the defined types were placed in this encounter.  Meds ordered this encounter  Medications  .  DISCONTD: diclofenac sodium (VOLTAREN) 1 % GEL    Sig: Apply 2 g topically 4 (four) times daily. To affected joint.    Dispense:  100 g    Refill:  11  . diclofenac sodium (VOLTAREN) 1 % GEL    Sig: Apply 2 g topically 4 (four) times daily. To affected joint.    Dispense:  100 g    Refill:  11    Discussed warning signs or symptoms. Please see discharge instructions. Patient expresses understanding.

## 2018-03-19 ENCOUNTER — Ambulatory Visit (INDEPENDENT_AMBULATORY_CARE_PROVIDER_SITE_OTHER): Payer: 59 | Admitting: Rehabilitative and Restorative Service Providers"

## 2018-03-19 ENCOUNTER — Encounter: Payer: Self-pay | Admitting: Rehabilitative and Restorative Service Providers"

## 2018-03-19 ENCOUNTER — Encounter: Payer: Self-pay | Admitting: Family Medicine

## 2018-03-19 DIAGNOSIS — R293 Abnormal posture: Secondary | ICD-10-CM | POA: Diagnosis not present

## 2018-03-19 DIAGNOSIS — M25511 Pain in right shoulder: Secondary | ICD-10-CM

## 2018-03-19 DIAGNOSIS — R29898 Other symptoms and signs involving the musculoskeletal system: Secondary | ICD-10-CM
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
## 2018-03-28 ENCOUNTER — Encounter: Payer: Self-pay | Admitting: Rehabilitative and Restorative Service Providers"

## 2018-03-30 ENCOUNTER — Encounter: Payer: Self-pay | Admitting: Rehabilitative and Restorative Service Providers"

## 2018-03-30 ENCOUNTER — Ambulatory Visit (INDEPENDENT_AMBULATORY_CARE_PROVIDER_SITE_OTHER): Payer: 59 | Admitting: Rehabilitative and Restorative Service Providers"

## 2018-03-30 DIAGNOSIS — M25511 Pain in right shoulder: Secondary | ICD-10-CM

## 2018-03-30 DIAGNOSIS — R29898 Other symptoms and signs involving the musculoskeletal system: Secondary | ICD-10-CM | POA: Diagnosis not present

## 2018-03-30 DIAGNOSIS — R293 Abnormal posture: Secondary | ICD-10-CM | POA: Diagnosis not present

## 2018-03-30 NOTE — Therapy (Signed)
[place], [place], [address] Phone: [phone]   Fax:  [phone]  Physical Therapy Treatment  Patient Details  Name: Deborah Goodman MRN: [id number] Date of Birth: 11-28-1976 Referring Provider: Dr Sheppard Coil    Encounter Date: 03/30/2018  PT End of Session - 03/30/18 0807    Visit Number  14    Number of Visits  24    Date for PT Re-Evaluation  04/04/18    PT Start Time  0800    PT Stop Time  1761    PT Time Calculation (min)  57 min    Activity Tolerance  Patient tolerated treatment well       Past Medical History:  Diagnosis Date  . Acid reflux   . Depression   . GAD (generalized anxiety disorder) 02/10/2017  . Hypertension 02/10/2017  . Palpitations 02/10/2017   a. 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   . SVT (supraventricular tachycardia) ([place])   . Systolic murmur   . Tobacco use     Past Surgical History:  Procedure Laterality Date  . SACRO-ILIAC PINNING  2016   fusion, in [place]    There were no vitals filed for this visit.  Subjective Assessment - 03/30/18 0813    Subjective  Patient reports that her shoulder is feeling better. She has done some of the doorway stretching but not much with the bands. She is being careful at work.     Currently in Pain?  No/denies         [facility] PT Assessment - 03/30/18 0001      AROM   Right Shoulder Extension  52 Degrees    Right Shoulder Flexion  147 Degrees    Right Shoulder ABduction  160 Degrees    Right Shoulder Internal Rotation  40 Degrees    Right Shoulder External Rotation  90 Degrees      Palpation   Palpation comment  tenderness and pain through the anterior chest in the sternum and costcocondral junction through upper and mid sternum into the anterior ribs; muscular tightness noted through Rt ant/lat/posterior cervical and upper trap; pecs; teres                   [facility] Adult PT  Treatment/Exercise - 03/30/18 0001      Shoulder Exercises: Supine   Other Supine Exercises  scap squeeze supine/thoracic lift       Shoulder Exercises: Standing   Extension  Strengthening;Both;10 reps;Theraband    Theraband Level (Shoulder Extension)  Level 3 (Green)    Row  Both;12 reps;Theraband    Theraband Level (Shoulder Row)  Level 3 (Green)    Retraction  Strengthening;Right;Left;12 reps;Theraband noodle along spine     Theraband Level (Shoulder Retraction)  Level 2 (Red)    Other Standing Exercises  scap squeeze with noodle 10 sec x 10; L's and W's with 5 sec hold x 10 reps      Shoulder Exercises: Stretch   Other Shoulder Stretches  3 way doorway stretch 30 sec x 2 reps each position; 2 sets    Other Shoulder Stretches  biceps stretch 20 sec x 2       Moist Heat Therapy   Number Minutes Moist Heat  20 Minutes    Moist Heat Location  Shoulder Rt      Electrical Stimulation   Electrical Stimulation Location  Rt shoulder; proximal biceps     Electrical  Stimulation Action  IFC    Electrical Stimulation Parameters  to tolerance    Electrical Stimulation Goals  Pain;Tone      Iontophoresis   Type of Iontophoresis  Dexamethasone    Location  Rt anterior shoulder     Dose  40 mAmp    Time  6 hr      Manual Therapy   Manual therapy comments  pt supine     Joint Mobilization  Rt GH mobs AP and inferior grade III    Soft tissue mobilization  Rt deltoid; biceps; pecs; upper trap     Myofascial Release  Rt anterior chest to anterior shoulder        Trigger Point Dry Needling - 03/30/18 0831    Consent Given?  Yes    Muscles Treated Upper Body  -- Rt with estim pecs + biceps     Pectoralis Major Response  Palpable increased muscle length    Pectoralis Minor Response  Palpable increased muscle length                PT Long Term Goals - 03/30/18 0809      PT LONG TERM GOAL #1   Title  Improve posture and alignment with patient to demonstrate improved upright  posture with posterior shoulder girdle engaged 04/04/18    Time  18    Period  Weeks    Status  On-going      PT LONG TERM GOAL #2   Title  Increase AROM Rt shoulder to equal or greater than AROM Lt shoulder  painfree and good scapular control/movement 04/04/18    Time  18    Period  Weeks    Status  On-going      PT LONG TERM GOAL #3   Title  Decrease pain by 75% allowing patient to perform functional and work activities with minimal to no difficulty 04/04/18    Time  18    Period  Weeks    Status  On-going      PT LONG TERM GOAL #4   Title  Independent in HEP 04/04/18    Time  18    Period  Weeks    Status  On-going      PT LONG TERM GOAL #5   Title  Improve FOTO to 30% limitation 04/04/18     Time  18    Period  Weeks    Status  On-going            Plan - 03/30/18 8921    Clinical Impression Statement  Improvement in symptoms. Slight improvement in AROM. Continued tightness anterior chest and shoulder. Patient is not consistent with HEP. Encouraged improved compliance.     Rehab Potential  Good    PT Frequency  2x / week    PT Duration  6 weeks    PT Treatment/Interventions  Patient/family education;ADLs/Self Care Home Management;Cryotherapy;Electrical Stimulation;Iontophoresis 4mg /ml Dexamethasone;Moist Heat;Ultrasound;Therapeutic activities;Therapeutic exercise;Dry needling;Manual techniques;Neuromuscular re-education    PT Next Visit Plan  cont ionto, RTC strengthening and chest stretching, assess response to DN; modalities as indicated progress strengthening (pt declined prone exercise - does not like lying on her stomach)    Consulted and Agree with Plan of Care  Patient       Patient will benefit from skilled therapeutic intervention in order to improve the following deficits and impairments:  Postural dysfunction, Improper body mechanics, Pain, Decreased mobility, Decreased range of motion, Decreased activity tolerance, Decreased strength  Visit Diagnosis:  Acute  pain of right shoulder  Other symptoms and signs involving the musculoskeletal system  Abnormal posture     Problem List Patient Active Problem List   Diagnosis Date Noted  . Rotator cuff impingement syndrome of right shoulder 03/16/2018  . Morbid obesity ([place]) 12/25/2017  . Family history of autoimmune disorder 12/18/2017  . Transaminitis 05/05/2017  . Lung nodule < 6cm on CT 05/05/2017  . Fever 04/27/2017  . PSVT (paroxysmal supraventricular tachycardia) ([place]) 03/13/2017  . Tobacco use 03/13/2017  . Palpitations 02/10/2017  . GAD (generalized anxiety disorder) 02/10/2017  . Hypertension 02/10/2017  . Abnormal thyroid blood test 02/10/2017    Marybell Robards Nilda Simmer PT, MPH  03/30/2018, 8:43 AM  [facility] [place], [place], [address] Phone: [phone]   Fax:  [phone]  Name: Deborah Goodman MRN: [id number] Date of Birth: Oct 18, 1976

## 2018-04-06 MED FILL — VIT D2 1.25 MG (50,000 UNIT: 1.25 MG | 28 days supply | Qty: 8 | Fill #0

## 2018-04-06 MED FILL — DULoxetine HCL 30 MG CPEP: 30 | 30 days supply | Qty: 30 | Fill #1

## 2018-04-09 ENCOUNTER — Encounter: Payer: 59 | Admitting: Rehabilitative and Restorative Service Providers"

## 2018-04-11 ENCOUNTER — Ambulatory Visit (INDEPENDENT_AMBULATORY_CARE_PROVIDER_SITE_OTHER): Payer: 59 | Admitting: Rehabilitative and Restorative Service Providers"

## 2018-04-11 ENCOUNTER — Encounter: Payer: Self-pay | Admitting: Rehabilitative and Restorative Service Providers"

## 2018-04-11 DIAGNOSIS — R293 Abnormal posture: Secondary | ICD-10-CM | POA: Diagnosis not present

## 2018-04-11 DIAGNOSIS — M25511 Pain in right shoulder: Secondary | ICD-10-CM

## 2018-04-11 DIAGNOSIS — R29898 Other symptoms and signs involving the musculoskeletal system: Secondary | ICD-10-CM

## 2018-04-11 NOTE — Patient Instructions (Signed)
Row with bow and arrow - stepping back with foot as you bring the theraband back  Green band x 20 reps can increase to 30    Sitting with right hand out to side press into sofa or bed  Hold 5 sec x 10 x 2 sets

## 2018-04-11 NOTE — Therapy (Signed)
[place], [place], [address] Phone: [phone]   Fax:  [phone]  Physical Therapy Treatment  Patient Details  Name: Deborah Goodman MRN: [id number] Date of Birth: June 02, 1976 Referring Provider: Dr Sheppard Coil   Encounter Date: 04/11/2018  PT End of Session - 04/11/18 1401    Visit Number  15    Number of Visits  24    Date for PT Re-Evaluation  04/04/18    PT Start Time  1400    PT Stop Time  1448    PT Time Calculation (min)  48 min    Activity Tolerance  Patient tolerated treatment well       Past Medical History:  Diagnosis Date  . Acid reflux   . Depression   . GAD (generalized anxiety disorder) 02/10/2017  . Hypertension 02/10/2017  . Palpitations 02/10/2017   a. 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   . SVT (supraventricular tachycardia) ([place])   . Systolic murmur   . Tobacco use     Past Surgical History:  Procedure Laterality Date  . SACRO-ILIAC PINNING  2016   fusion, in [place]    There were no vitals filed for this visit.  Subjective Assessment - 04/11/18 1401    Subjective  Patient reports that her shoulder is feeling much better. She has no pain in the Rt shoulder since last week. Shoulder has been "great". Working on her exercises when she can - has worked 4 shifts of 12 hours in a row and has been sick with cold like symptoms in the past 3 days. Feeling better now.     Currently in Pain?  No/denies         [facility] PT Assessment - 04/11/18 0001      Assessment   Medical Diagnosis  Rt shoulder dysfunction    Referring Provider  Dr Sheppard Coil    Onset Date/Surgical Date  02/02/17    Hand Dominance  Right    Next MD Visit  PRN      AROM   Right Shoulder Extension  53 Degrees    Right Shoulder Flexion  157 Degrees    Right Shoulder ABduction  165 Degrees    Right Shoulder Internal Rotation  40 Degrees    Right Shoulder External Rotation   92 Degrees      Strength   Right Shoulder Flexion  4+/5    Right Shoulder Extension  5/5    Right Shoulder ABduction  -- 5-/5    Right Shoulder Internal Rotation  -- 5-/5    Right Shoulder External Rotation  -- 5-/5      Palpation   Palpation comment  tenderness and minimal pain through the anterior chest in the sternum and costcocondral junction through upper and mid sternum into the anterior ribs; muscular tightness noted through Rt ant/lat/posterior cervical and upper trap; pecs; teres                   [facility] Adult PT Treatment/Exercise - 04/11/18 0001      Shoulder Exercises: Standing   Extension  Strengthening;Both;Theraband;20 reps    Theraband Level (Shoulder Extension)  Level 3 (Green)    Row  Both;20 reps;Theraband row at 45 deg x 20; row with step back x 20     Theraband Level (Shoulder Row)  Level 3 (Green)    Retraction  Strengthening;Right;Left;Theraband;20 reps noodle along spine     Theraband Level (Shoulder  Retraction)  Level 2 (Red)    Other Standing Exercises  scap squeeze with noodle 10 sec x 10; L's and W's with 5 sec hold x 10 reps      Shoulder Exercises: Therapy Ball   Other Therapy Ball Exercises  pressing into exercise ball 5 sec x 10; perturbation of ball by PT for 30-45 sec x 3 reps       Shoulder Exercises: Stretch   Other Shoulder Stretches  3 way doorway stretch 30 sec x 2 reps each position; 2 sets    Other Shoulder Stretches  biceps stretch 20 sec x 2       Shoulder Exercises: Body Blade   Other Body Blade Exercises  scaption 30 sec x 3 reps      Moist Heat Therapy   Number Minutes Moist Heat  20 Minutes    Moist Heat Location  Shoulder Rt      Electrical Stimulation   Electrical Stimulation Location  Rt shoulder; proximal biceps     Electrical Stimulation Action  IFC    Electrical Stimulation Parameters  to tolerance    Electrical Stimulation Goals  Pain;Tone             PT Education - 04/11/18 1435    Education  provided  Yes    Education Details  HEP     Person(s) Educated  Patient    Methods  Explanation;Demonstration;Tactile cues;Verbal cues;Handout    Comprehension  Verbalized understanding;Returned demonstration;Verbal cues required;Tactile cues required          PT Long Term Goals - 04/11/18 1403      PT LONG TERM GOAL #1   Title  Improve posture and alignment with patient to demonstrate improved upright posture with posterior shoulder girdle engaged 04/04/18    Time  18    Period  Weeks    Status  On-going      PT LONG TERM GOAL #2   Title  Increase AROM Rt shoulder to equal or greater than AROM Lt shoulder  painfree and good scapular control/movement 04/04/18    Time  18    Period  Weeks    Status  Achieved      PT LONG TERM GOAL #3   Title  Decrease pain by 75% allowing patient to perform functional and work activities with minimal to no difficulty 04/04/18    Time  18    Period  Weeks    Status  Achieved      PT LONG TERM GOAL #4   Title  Independent in HEP 04/04/18    Time  18    Period  Weeks    Status  On-going      PT LONG TERM GOAL #5   Title  Improve FOTO to 30% limitation 04/04/18     Time  18    Period  Weeks    Status  On-going            Plan - 04/11/18 1411    Clinical Impression Statement  Progressing well with shoulder rehab. Patient reports no pain in the Rt shoulder in the past week. She has increased AROM and strength in Rt UE with less tightness and tenderness to palpation. She is progressing well toward stated goals of therapy,     Rehab Potential  Good    PT Frequency  2x / week    PT Duration  6 weeks    PT Treatment/Interventions  Patient/family education;ADLs/Self Care Home Management;Cryotherapy;Electrical Stimulation;Iontophoresis  4mg /ml Dexamethasone;Moist Heat;Ultrasound;Therapeutic activities;Therapeutic exercise;Dry needling;Manual techniques;Neuromuscular re-education    PT Next Visit Plan  cont ionto as needed, RTC strengthening and  chest stretching, assess response to DN; modalities as indicated progress strengthening (pt declined prone exercise - does not like lying on her stomach)    Consulted and Agree with Plan of Care  Patient       Patient will benefit from skilled therapeutic intervention in order to improve the following deficits and impairments:  Postural dysfunction, Improper body mechanics, Pain, Decreased mobility, Decreased range of motion, Decreased activity tolerance, Decreased strength  Visit Diagnosis: Acute pain of right shoulder  Other symptoms and signs involving the musculoskeletal system  Abnormal posture     Problem List Patient Active Problem List   Diagnosis Date Noted  . Rotator cuff impingement syndrome of right shoulder 03/16/2018  . Morbid obesity ([place]) 12/25/2017  . Family history of autoimmune disorder 12/18/2017  . Transaminitis 05/05/2017  . Lung nodule < 6cm on CT 05/05/2017  . Fever 04/27/2017  . PSVT (paroxysmal supraventricular tachycardia) ([place]) 03/13/2017  . Tobacco use 03/13/2017  . Palpitations 02/10/2017  . GAD (generalized anxiety disorder) 02/10/2017  . Hypertension 02/10/2017  . Abnormal thyroid blood test 02/10/2017    Kristyne Woodring Nilda Simmer PT, MPH  04/11/2018, 2:35 PM  [facility]  [place], [place], [address] Phone: [phone]   Fax:  [phone]  Name: Deborah Goodman MRN: [id number] Date of Birth: 1976-01-09

## 2018-04-16 ENCOUNTER — Ambulatory Visit (INDEPENDENT_AMBULATORY_CARE_PROVIDER_SITE_OTHER): Payer: 59 | Admitting: Family Medicine

## 2018-04-16 ENCOUNTER — Encounter: Payer: 59 | Admitting: Rehabilitative and Restorative Service Providers"

## 2018-04-16 ENCOUNTER — Encounter: Payer: Self-pay | Admitting: Family Medicine

## 2018-04-16 ENCOUNTER — Ambulatory Visit (INDEPENDENT_AMBULATORY_CARE_PROVIDER_SITE_OTHER): Payer: 59

## 2018-04-16 VITALS — BP 115/77 | HR 77 | Ht 66.0 in | Wt 181.0 lb

## 2018-04-16 DIAGNOSIS — M7541 Impingement syndrome of right shoulder: Secondary | ICD-10-CM | POA: Diagnosis not present

## 2018-04-16 DIAGNOSIS — F17200 Nicotine dependence, unspecified, uncomplicated: Secondary | ICD-10-CM

## 2018-04-16 DIAGNOSIS — R0989 Other specified symptoms and signs involving the circulatory and respiratory systems: Secondary | ICD-10-CM | POA: Diagnosis not present

## 2018-04-16 DIAGNOSIS — R0602 Shortness of breath: Secondary | ICD-10-CM

## 2018-04-16 DIAGNOSIS — R05 Cough: Secondary | ICD-10-CM | POA: Diagnosis not present

## 2018-04-16 DIAGNOSIS — R0789 Other chest pain: Secondary | ICD-10-CM

## 2018-04-16 MED ORDER — BENZONATATE 200 MG PO CAPS: 200.0000 mg | ORAL_CAPSULE | Freq: Three times a day (TID) | ORAL | 0 refills | Status: DC | PRN

## 2018-04-16 MED ORDER — PREDNISONE 10 MG PO TABS: 30.0000 mg | ORAL_TABLET | Freq: Every day | ORAL | 0 refills | Status: DC

## 2018-04-16 NOTE — Progress Notes (Signed)
Deborah Goodman is a 42 y.o. female who presents to [facility] today for   Shoulder Follow up: Deborah Goodman was seen at the end of March for persistent right shoulder pain that failed conservative management. She received an ultrasound guided steroid injection and is reporting today that her shoulder is pain free. She has been performing physical therapy as well. She has no complaints at this time and is happy with her progress.   Shortness of breath: Patient is also reporting that she developed a cough and a low grade fever 8 days ago. She was feeling chest pressure and shortness of breath. She broke the fever but has continued to have a cough and shortness of breath. The shortness of breath is worse on exertion and she alleviates it slightly with albuterol inhaler use. She has been taking zyrtec with no alleviation of symptoms. She endorses chest discomfort.    Past Medical History:  Diagnosis Date  . Acid reflux   . Depression   . GAD (generalized anxiety disorder) 02/10/2017  . Hypertension 02/10/2017  . Palpitations 02/10/2017   a. 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   . SVT (supraventricular tachycardia) ([place])   . Systolic murmur   . Tobacco use    Past Surgical History:  Procedure Laterality Date  . SACRO-ILIAC PINNING  2016   fusion, in [place] History   Tobacco Use  . Smoking status: Current Every Day Smoker    Packs/day: 0.50    Years: 22.00    Pack years: 11.00    Types: Cigarettes  . Smokeless tobacco: Never Used  Substance Use Topics  . Alcohol use: Yes   family history includes Arrhythmia in her sister; Cancer in her maternal grandfather; Depression in her mother; Diabetes in her father and paternal grandfather; Hypertension in her mother.  ROS as above:  Medications: Current Outpatient Medications    Medication Sig Dispense Refill  . albuterol (PROVENTIL HFA;VENTOLIN HFA) 108 (90 Base) MCG/ACT inhaler Inhale 2 puffs into the lungs every 6 (six) hours as needed for wheezing. 2 Inhaler 11  . B Complex-C (B-COMPLEX WITH VITAMIN C) tablet Take 1 tablet by mouth daily.    . cholecalciferol (VITAMIN D) 1000 units tablet Take 1,000 Units by mouth daily.    . diclofenac sodium (VOLTAREN) 1 % GEL Apply 2 g topically 4 (four) times daily. To affected joint. 100 g 11  . DULoxetine (CYMBALTA) 30 MG capsule Take 1 capsule (30 mg total) by mouth daily. 30 capsule 3  . ipratropium (ATROVENT) 0.06 % nasal spray Place 2 sprays into both nostrils 4 (four) times daily. (Patient taking differently: Place 2 sprays into both nostrils as needed. ) 15 mL 12  . meloxicam (MOBIC) 15 MG tablet TAKE 1 TABLET (15 MG TOTAL) BY MOUTH DAILY. PT NEEDS TO KEEP UPCOMING APPT W/PCP. 30 tablet 0  . metoprolol succinate (TOPROL-XL) 50 MG 24 hr tablet Take 1 tablet (50 mg total) by mouth daily. Take with or immediately following a meal. 90 tablet 3  . Multiple Vitamin (MULTIVITAMIN WITH MINERALS) TABS tablet Take 1 tablet by mouth daily.    . Omega-3 Fatty Acids (FISH OIL PO) Take 1 capsule by mouth daily.    . Vitamin D, Ergocalciferol, (DRISDOL) 50000 units CAPS capsule Take 1 capsule (50,000 Units total) by mouth 2 (two) times a week. 24 capsule 0  . benzonatate (TESSALON) 200  MG capsule Take 1 capsule (200 mg total) by mouth 3 (three) times daily as needed for cough. 45 capsule 0  . predniSONE (DELTASONE) 10 MG tablet Take 3 tablets (30 mg total) by mouth daily with breakfast. 15 tablet 0   No current facility-administered medications for this visit.    Allergies  Allergen Reactions  . Fish Allergy Nausea And Vomiting    ALL SEAFOOD  . Shellfish Allergy Nausea And Vomiting    ALL SEAFOOD  . Sulfa Antibiotics Other (See Comments)    Thrush reaction    Health Maintenance Health Maintenance  Topic Date Due  . HIV  Screening  06/12/1992  . INFLUENZA VACCINE  07/19/2018  . PAP SMEAR  12/25/2020  . TETANUS/TDAP  07/19/2025     Exam:  BP 115/77   Pulse 77   Ht 5\' 6"  (1.676 m)   Wt 181 lb (82.1 kg)   BMI 29.21 kg/m  Gen: Well NAD HEENT: EOMI,  MMM Lungs: Normal work of breathing. Mild wheezing.  Heart: RRR no MRG Abd: NABS, Soft. Nondistended, Nontender Exts: Brisk capillary refill, warm and well perfused.    EKG: Normal Sinus Rhythm Normal EKG  2 view chest xray with bronchitis pattern without acute findings with stable appearing small nodule in the RUL.  Awaiting radiology review.  I personally (independently) visualized and performed the interpretation of the images attached in this note.    Assessment and Plan: 43 y.o. female with   Right Shoulder pain: Patient is doing well with no pain after steroid injection and physical therapy. She should complete her last physical therapy appointment then continue exercises at home from then on. She should return if the pain returns.   Shortness of Breath: Patient has had a cough and shortness of breath for a week. Her having a fever at the beginning makes this likely post-URI bronchitis. However, her chest pressure and shortness of breath on exertion makes [place] concerned for more severe lung pathology or cardiac involvement. We ordered a chest X-ray which was normal and an EKG which was normal. We will treat this as bronchitis with oral steroids and continued albuterol inhaler. We discussed smoking's effect on this and I believe she should follow up for pulmonary testing to potentially establish a COPD or Asthma diagnosis. If she is not improving by the end of the week or she worsens and develops a fever, she should return for further work up.    Orders Placed This Encounter  Procedures  . DG Chest 2 View    Order Specific Question:   Reason for exam:    Answer:   Cough, assess intra-thoracic pathology    Order Specific Question:   Is the patient  pregnant?    Answer:   No    Order Specific Question:   Preferred imaging location?    Answer:   [facility]   Meds ordered this encounter  Medications  . predniSONE (DELTASONE) 10 MG tablet    Sig: Take 3 tablets (30 mg total) by mouth daily with breakfast.    Dispense:  15 tablet    Refill:  0  . benzonatate (TESSALON) 200 MG capsule    Sig: Take 1 capsule (200 mg total) by mouth 3 (three) times daily as needed for cough.    Dispense:  45 capsule    Refill:  0     Discussed warning signs or symptoms. Please see discharge instructions. Patient expresses understanding.

## 2018-04-16 NOTE — Patient Instructions (Signed)
Thank you for coming in today. Take the prednisone for 5 days.  Use albuterol as needed.  Use tessalon for cough.  Consider 7mg  nicotine patch with nicotine lozenges or mini lozenges.  Also consider Bupropion as well.  Consider 1-800-Quit-Now.   Recheck with Dr Sheppard Coil in 6 weeks for lung test for ?COPD.  Do not use the albuterol the same day at the test.    Acute Bronchitis, Adult Acute bronchitis is sudden (acute) swelling of the air tubes (bronchi) in the lungs. Acute bronchitis causes these tubes to fill with mucus, which can make it hard to breathe. It can also cause coughing or wheezing. In adults, acute bronchitis usually goes away within 2 weeks. A cough caused by bronchitis may last up to 3 weeks. Smoking, allergies, and asthma can make the condition worse. Repeated episodes of bronchitis may cause further lung problems, such as chronic obstructive pulmonary disease (COPD). What are the causes? This condition can be caused by germs and by substances that irritate the lungs, including:  Cold and flu viruses. This condition is most often caused by the same virus that causes a cold.  Bacteria.  Exposure to tobacco smoke, dust, fumes, and air pollution.  What increases the risk? This condition is more likely to develop in people who:  Have close contact with someone with acute bronchitis.  Are exposed to lung irritants, such as tobacco smoke, dust, fumes, and vapors.  Have a weak immune system.  Have a respiratory condition such as asthma.  What are the signs or symptoms? Symptoms of this condition include:  A cough.  Coughing up clear, yellow, or green mucus.  Wheezing.  Chest congestion.  Shortness of breath.  A fever.  Body aches.  Chills.  A sore throat.  How is this diagnosed? This condition is usually diagnosed with a physical exam. During the exam, your health care provider may order tests, such as chest X-rays, to rule out other conditions. He  or she may also:  Test a sample of your mucus for bacterial infection.  Check the level of oxygen in your blood. This is done to check for pneumonia.  Do a chest X-ray or lung function testing to rule out pneumonia and other conditions.  Perform blood tests.  Your health care provider will also ask about your symptoms and medical history. How is this treated? Most cases of acute bronchitis clear up over time without treatment. Your health care provider may recommend:  Drinking more fluids. Drinking more makes your mucus thinner, which may make it easier to breathe.  Taking a medicine for a fever or cough.  Taking an antibiotic medicine.  Using an inhaler to help improve shortness of breath and to control a cough.  Using a cool mist vaporizer or humidifier to make it easier to breathe.  Follow these instructions at home: Medicines  Take over-the-counter and prescription medicines only as told by your health care provider.  If you were prescribed an antibiotic, take it as told by your health care provider. Do not stop taking the antibiotic even if you start to feel better. General instructions  Get plenty of rest.  Drink enough fluids to keep your urine clear or pale yellow.  Avoid smoking and secondhand smoke. Exposure to cigarette smoke or irritating chemicals will make bronchitis worse. If you smoke and you need help quitting, ask your health care provider. Quitting smoking will help your lungs heal faster.  Use an inhaler, cool mist vaporizer, or humidifier as  told by your health care provider.  Keep all follow-up visits as told by your health care provider. This is important. How is this prevented? To lower your risk of getting this condition again:  Wash your hands often with soap and water. If soap and water are not available, use hand sanitizer.  Avoid contact with people who have cold symptoms.  Try not to touch your hands to your mouth, nose, or eyes.  Make  sure to get the flu shot every year.  Contact a health care provider if:  Your symptoms do not improve in 2 weeks of treatment. Get help right away if:  You cough up blood.  You have chest pain.  You have severe shortness of breath.  You become dehydrated.  You faint or keep feeling like you are going to faint.  You keep vomiting.  You have a severe headache.  Your fever or chills gets worse. This information is not intended to replace advice given to you by your health care provider. Make sure you discuss any questions you have with your health care provider. Document Released: 01/12/2005 Document Revised: 06/29/2016 Document Reviewed: 05/25/2016 Elsevier Interactive Patient Education  2018 [employer].  Bupropion sustained-release tablets (smoking cessation) What is this medicine? BUPROPION (byoo PROE pee on) is used to help people quit smoking. This medicine may be used for other purposes; ask your health care provider or pharmacist if you have questions. COMMON BRAND NAME(S): Buproban, Zyban What should I tell my health care provider before I take this medicine? They need to know if you have any of these conditions: -an eating disorder, such as anorexia or bulimia -bipolar disorder or psychosis -diabetes or high blood sugar, treated with medication -glaucoma -head injury or brain tumor -heart disease, previous heart attack, or irregular heart beat -high blood pressure -kidney or liver disease -seizures -suicidal thoughts or a previous suicide attempt -Tourette's syndrome -weight loss -an unusual or allergic reaction to bupropion, other medicines, foods, dyes, or preservatives -breast-feeding -pregnant or trying to become pregnant How should I use this medicine? Take this medicine by mouth with a glass of water. Follow the directions on the prescription label. You can take it with or without food. If it upsets your stomach, take it with food. Do not cut, crush or  chew this medicine. Take your medicine at regular intervals. If you take this medicine more than once a day, take your second dose at least 8 hours after you take your first dose. To limit difficulty in sleeping, avoid taking this medicine at bedtime. Do not take your medicine more often than directed. Do not stop taking this medicine suddenly except upon the advice of your doctor. Stopping this medicine too quickly may cause serious side effects. A special MedGuide will be given to you by the pharmacist with each prescription and refill. Be sure to read this information carefully each time. Talk to your pediatrician regarding the use of this medicine in children. Special care may be needed. Overdosage: If you think you have taken too much of this medicine contact a poison control center or emergency room at once. NOTE: This medicine is only for you. Do not share this medicine with others. What if I miss a dose? If you miss a dose, skip the missed dose and take your next tablet at the regular time. There should be at least 8 hours between doses. Do not take double or extra doses. What may interact with this medicine? Do not take this  medicine with any of the following medications: -linezolid -MAOIs like Azilect, Carbex, Eldepryl, Marplan, Nardil, and Parnate -methylene blue (injected into a vein) -other medicines that contain bupropion like Wellbutrin This medicine may also interact with the following medications: -alcohol -certain medicines for anxiety or sleep -certain medicines for blood pressure like metoprolol, propranolol -certain medicines for depression or psychotic disturbances -certain medicines for HIV or AIDS like efavirenz, lopinavir, nelfinavir, ritonavir -certain medicines for irregular heart beat like propafenone, flecainide -certain medicines for Parkinson's disease like amantadine, levodopa -certain medicines for seizures like carbamazepine, phenytoin,  phenobarbital -cimetidine -clopidogrel -cyclophosphamide -digoxin -furazolidone -isoniazid -nicotine -orphenadrine -procarbazine -steroid medicines like prednisone or cortisone -stimulant medicines for attention disorders, weight loss, or to stay awake -tamoxifen -theophylline -thiotepa -ticlopidine -tramadol -warfarin This list may not describe all possible interactions. Give your health care provider a list of all the medicines, herbs, non-prescription drugs, or dietary supplements you use. Also tell them if you smoke, drink alcohol, or use illegal drugs. Some items may interact with your medicine. What should I watch for while using this medicine? Visit your doctor or health care professional for regular checks on your progress. This medicine should be used together with a patient support program. It is important to participate in a behavioral program, counseling, or other support program that is recommended by your health care professional. Patients and their families should watch out for new or worsening thoughts of suicide or depression. Also watch out for sudden changes in feelings such as feeling anxious, agitated, panicky, irritable, hostile, aggressive, impulsive, severely restless, overly excited and hyperactive, or not being able to sleep. If this happens, especially at the beginning of treatment or after a change in dose, call your health care professional. Avoid alcoholic drinks while taking this medicine. Drinking excessive alcoholic beverages, using sleeping or anxiety medicines, or quickly stopping the use of these agents while taking this medicine may increase your risk for a seizure. Do not drive or use heavy machinery until you know how this medicine affects you. This medicine can impair your ability to perform these tasks. Do not take this medicine close to bedtime. It may prevent you from sleeping. Your mouth may get dry. Chewing sugarless gum or sucking hard candy, and  drinking plenty of water may help. Contact your doctor if the problem does not go away or is severe. Do not use nicotine patches or chewing gum without the advice of your doctor or health care professional while taking this medicine. You may need to have your blood pressure taken regularly if your doctor recommends that you use both nicotine and this medicine together. What side effects may I notice from receiving this medicine? Side effects that you should report to your doctor or health care professional as soon as possible: -allergic reactions like skin rash, itching or hives, swelling of the face, lips, or tongue -breathing problems -changes in vision -confusion -elevated mood, decreased need for sleep, racing thoughts, impulsive behavior -fast or irregular heartbeat -hallucinations, loss of contact with reality -increased blood pressure -redness, blistering, peeling or loosening of the skin, including inside the mouth -seizures -suicidal thoughts or other mood changes -unusually weak or tired -vomiting Side effects that usually do not require medical attention (report to your doctor or health care professional if they continue or are bothersome): -constipation -headache -loss of appetite -nausea -tremors -weight loss This list may not describe all possible side effects. Call your doctor for medical advice about side effects. You may report side effects to  FDA at 1-800-FDA-1088. Where should I keep my medicine? Keep out of the reach of children. Store at room temperature between 20 and 25 degrees C (68 and 77 degrees F). Protect from light. Keep container tightly closed. Throw away any unused medicine after the expiration date. NOTE: This sheet is a summary. It may not cover all possible information. If you have questions about this medicine, talk to your doctor, pharmacist, or health care provider.  2018 Elsevier/Gold Standard (2016-05-27 13:49:28)

## 2018-04-17 ENCOUNTER — Encounter: Payer: Self-pay | Admitting: Family Medicine

## 2018-04-17 MED ORDER — AZITHROMYCIN 250 MG PO TABS: 250.0000 mg | ORAL_TABLET | Freq: Every day | ORAL | 0 refills | Status: DC

## 2018-04-17 NOTE — Addendum Note (Signed)
Addended by: Gregor Hams on: 04/17/2018 04:39 PM   Modules accepted: Orders

## 2018-04-17 NOTE — Telephone Encounter (Signed)
Spoke with patient- she is going to call pharmacy to have RX transferred. Pt just wants Dr Georgina Snell to be aware that [employer] is sending over paperwork to be completed because she received points for being late to work a couple times. Note to Dr Georgina Snell

## 2018-04-18 ENCOUNTER — Encounter: Payer: Self-pay | Admitting: Rehabilitative and Restorative Service Providers"

## 2018-04-18 ENCOUNTER — Ambulatory Visit (INDEPENDENT_AMBULATORY_CARE_PROVIDER_SITE_OTHER): Payer: 59 | Admitting: Rehabilitative and Restorative Service Providers"

## 2018-04-18 DIAGNOSIS — M25511 Pain in right shoulder: Secondary | ICD-10-CM | POA: Diagnosis not present

## 2018-04-18 DIAGNOSIS — R29898 Other symptoms and signs involving the musculoskeletal system: Secondary | ICD-10-CM | POA: Diagnosis not present

## 2018-04-18 DIAGNOSIS — R293 Abnormal posture: Secondary | ICD-10-CM | POA: Diagnosis not present

## 2018-04-18 NOTE — Therapy (Addendum)
[facility] [address] [place], [place], [address] Phone: [phone]   Fax:  [phone]  Physical Therapy Treatment  Patient Details  Name: Deborah Goodman MRN: [id number] Date of Birth: 1975-12-25 Referring Provider: Dr Sheppard Coil    Encounter Date: 04/18/2018  PT End of Session - 04/18/18 1410    Visit Number  16    Number of Visits  24    Date for PT Re-Evaluation  04/04/18    PT Start Time  1400    PT Stop Time  1431    PT Time Calculation (min)  31 min    Activity Tolerance  Patient tolerated treatment well       Past Medical History:  Diagnosis Date  . Acid reflux   . Depression   . GAD (generalized anxiety disorder) 02/10/2017  . Hypertension 02/10/2017  . Palpitations 02/10/2017   a. 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   . SVT (supraventricular tachycardia) ([place])   . Systolic murmur   . Tobacco use     Past Surgical History:  Procedure Laterality Date  . SACRO-ILIAC PINNING  2016   fusion, in [place]    There were no vitals filed for this visit.  Subjective Assessment - 04/18/18 1415    Subjective  Doing well. Seen by MD and he feels she is OK to discontinue PT and continue with independnet HEP. Patient is confident with this plan. She is pain free.     Currently in Pain?  No/denies         [facility] PT Assessment - 04/18/18 0001      Assessment   Medical Diagnosis  Rt shoulder dysfunction    Referring Provider  Dr Sheppard Coil     Onset Date/Surgical Date  02/02/17    Hand Dominance  Right    Next MD Visit  PRN      Observation/Other Assessments   Focus on Therapeutic Outcomes (FOTO)   29% limitation       AROM   Right Shoulder Extension  53 Degrees    Right Shoulder Flexion  158 Degrees    Right Shoulder ABduction  165 Degrees    Right Shoulder Internal Rotation  40 Degrees    Right Shoulder External Rotation  93 Degrees      Strength   Right Shoulder  Flexion  -- 5-/5    Right Shoulder Extension  5/5    Right Shoulder ABduction  -- 5-/5    Right Shoulder Internal Rotation  -- 5-/5    Right Shoulder External Rotation  -- 5-/5      Palpation   Palpation comment  minimal tightness through the anterior chest in the sternum and costcocondral junction through upper and mid sternum into the anterior ribs; muscular tightness noted through Rt ant/lat/posterior cervical and upper trap; pecs; teres                   [facility] Adult PT Treatment/Exercise - 04/18/18 0001      Shoulder Exercises: Supine   Other Supine Exercises  scap squeeze supine/thoracic lift       Shoulder Exercises: Standing   External Rotation  Strengthening;Both;Theraband;20 reps    Theraband Level (Shoulder External Rotation)  Level 3 (Green)    Extension  Strengthening;Both;Theraband;20 reps    Theraband Level (Shoulder Extension)  Level 4 (Blue)    Row  Both;20 reps;Theraband row at 45 deg x 20; row with step back x  20     Theraband Level (Shoulder Row)  Level 4 (Blue)    Retraction  Strengthening;Right;Left;Theraband;20 reps noodle along spine     Theraband Level (Shoulder Retraction)  Level 2 (Red)    Other Standing Exercises  scap squeeze with noodle 10 sec x 10; L's and W's with 5 sec hold x 10 reps    Other Standing Exercises  reverse wall push up x 20; slide hands up and down wall with dorsum of hand toward the wall x 10       Shoulder Exercises: Stretch   Other Shoulder Stretches  3 way doorway stretch 30 sec x 2 reps each position; 2 sets    Other Shoulder Stretches  biceps stretch 20 sec x 2              PT Education - 04/18/18 1428    Education provided  Yes    Education Details  HEP     Person(s) Educated  Patient    Methods  Explanation;Demonstration;Tactile cues;Verbal cues;Handout    Comprehension  Verbalized understanding;Returned demonstration;Verbal cues required;Tactile cues required          PT Long Term Goals - 04/18/18  1434      PT LONG TERM GOAL #1   Title  Improve posture and alignment with patient to demonstrate improved upright posture with posterior shoulder girdle engaged 04/04/18    Time  18    Period  Weeks    Status  Partially Met      PT LONG TERM GOAL #2   Title  Increase AROM Rt shoulder to equal or greater than AROM Lt shoulder  painfree and good scapular control/movement 04/04/18    Time  18    Period  Weeks    Status  Achieved      PT LONG TERM GOAL #3   Title  Decrease pain by 75% allowing patient to perform functional and work activities with minimal to no difficulty 04/04/18    Time  18    Period  Weeks    Status  Achieved      PT LONG TERM GOAL #4   Title  Independent in HEP 04/04/18    Time  18    Period  Weeks    Status  Achieved      PT LONG TERM GOAL #5   Title  Improve FOTO to 30% limitation 04/04/18     Time  18    Period  Weeks    Status  Achieved            Plan - 04/18/18 1411    Clinical Impression Statement  Progressing well with all goals of shoulder rehab. Patient feels confident in continuing with independent HEP. She demonstrates increased AROM and strength in Rt UE with decreased tightness and pain. Most goals of therpay have been accomplished. Patient will continue work on home exercises and call with any questions or problems.     Rehab Potential  Good    PT Frequency  2x / week    PT Duration  6 weeks    PT Treatment/Interventions  Patient/family education;ADLs/Self Care Home Management;Cryotherapy;Electrical Stimulation;Iontophoresis 50m/ml Dexamethasone;Moist Heat;Ultrasound;Therapeutic activities;Therapeutic exercise;Dry needling;Manual techniques;Neuromuscular re-education    PT Next Visit Plan  continue with independent HEP - patient will call with any questions     Consulted and Agree with Plan of Care  Patient       Patient will benefit from skilled therapeutic intervention in order to improve the  following deficits and impairments:  Postural  dysfunction, Improper body mechanics, Pain, Decreased mobility, Decreased range of motion, Decreased activity tolerance, Decreased strength  Visit Diagnosis: Acute pain of right shoulder  Other symptoms and signs involving the musculoskeletal system  Abnormal posture     Problem List Patient Active Problem List   Diagnosis Date Noted  . Rotator cuff impingement syndrome of right shoulder 03/16/2018  . Morbid obesity ([place]) 12/25/2017  . Family history of autoimmune disorder 12/18/2017  . Transaminitis 05/05/2017  . Lung nodule < 6cm on CT 05/05/2017  . Fever 04/27/2017  . PSVT (paroxysmal supraventricular tachycardia) ([place]) 03/13/2017  . Tobacco use 03/13/2017  . Palpitations 02/10/2017  . GAD (generalized anxiety disorder) 02/10/2017  . Hypertension 02/10/2017  . Abnormal thyroid blood test 02/10/2017    Harper Vandervoort Nilda Simmer PT, MPH  04/18/2018, 2:36 PM  [facility]  [place], [place], [address] Phone: [phone]   Fax:  [phone]  Name: Deborah Goodman MRN: [id number] Date of Birth: Nov 05, 1976  PHYSICAL THERAPY DISCHARGE SUMMARY  Visits from Start of Care: 16  Current functional level related to goals / functional outcomes: See last progress note for discharge status    Remaining deficits: Unknown    Education / Equipment: HEP  Plan: Patient agrees to discharge.  Patient goals were met. Patient is being discharged due to meeting the stated rehab goals.  ?????     Kitt Minardi P. Helene Kelp PT, MPH 04/26/18 3:27 PM

## 2018-04-18 NOTE — Patient Instructions (Signed)
Reverse wall push up   Hands up against wall with the back of hands toward the wall - then slide arms up and wall and slowly back down   Standing with wall to right side arm at 90 deg elbow bend - then make small circles with ball on wall  Gradually turn body so the back is toward the wall - making small circles  Gradually raise arm so shulder is 90 deg and elbow is 90 deg - making small circles with ball

## 2018-04-19 ENCOUNTER — Encounter: Payer: Self-pay | Admitting: Family Medicine

## 2018-04-19 NOTE — Addendum Note (Signed)
Addended by: Huel Cote on: 04/19/2018 04:11 PM   Modules accepted: Orders

## 2018-04-20 ENCOUNTER — Ambulatory Visit: Payer: 59

## 2018-04-27 ENCOUNTER — Telehealth: Payer: Self-pay | Admitting: Family Medicine

## 2018-04-27 NOTE — Telephone Encounter (Signed)
Called patient about FMLA form.  I am getting need more information face-to-face meeting or if patient just cannot make it in a phone meeting.  Called and left a message.

## 2018-05-01 ENCOUNTER — Telehealth: Payer: Self-pay | Admitting: Osteopathic Medicine

## 2018-05-01 ENCOUNTER — Ambulatory Visit: Payer: 59 | Admitting: Family Medicine

## 2018-05-01 NOTE — Telephone Encounter (Signed)
Noted  

## 2018-05-01 NOTE — Telephone Encounter (Signed)
Pt called at 8:45 to cancel appt.

## 2018-05-03 ENCOUNTER — Telehealth: Payer: Self-pay | Admitting: Osteopathic Medicine

## 2018-05-03 NOTE — Telephone Encounter (Signed)
Please call patient: I keep getting FMLA fors in my in-basket for her. Dr Georgina Snell or I need to see her to fill out FMLA forms - he was last to see her, looks like she's cancelled a few appointment w/ him and no-showed to one with me recently. Let's call to make sure she's ok and let's encourage a follow up with one of [place] to see what's going on.

## 2018-05-04 ENCOUNTER — Encounter: Payer: Self-pay | Admitting: Family Medicine

## 2018-05-04 NOTE — Telephone Encounter (Signed)
Dr Georgina Snell spoke with Pt regarding this today.

## 2018-05-07 ENCOUNTER — Ambulatory Visit (INDEPENDENT_AMBULATORY_CARE_PROVIDER_SITE_OTHER): Payer: 59 | Admitting: Family Medicine

## 2018-05-07 ENCOUNTER — Ambulatory Visit: Payer: 59 | Admitting: Family Medicine

## 2018-05-07 ENCOUNTER — Encounter: Payer: Self-pay | Admitting: Family Medicine

## 2018-05-07 VITALS — BP 142/96 | HR 74 | Wt 187.0 lb

## 2018-05-07 DIAGNOSIS — M7541 Impingement syndrome of right shoulder: Secondary | ICD-10-CM

## 2018-05-07 DIAGNOSIS — R059 Cough, unspecified: Secondary | ICD-10-CM

## 2018-05-07 DIAGNOSIS — Z720 Tobacco use: Secondary | ICD-10-CM | POA: Diagnosis not present

## 2018-05-07 DIAGNOSIS — R05 Cough: Secondary | ICD-10-CM | POA: Diagnosis not present

## 2018-05-07 MED FILL — DULoxetine HCL 30 MG CPEP: 30 | 30 days supply | Qty: 30 | Fill #2

## 2018-05-07 NOTE — Progress Notes (Signed)
Deborah Goodman is a 42 y.o. female who presents to [facility]: [place] today for follow up CAP and shoulder pain.   Deborah Goodman was seen on 04/16/18 for shoulder pain and cough diagnosed as CAP.   Her shoulder pain resolved with PT and home exercise program. She completed PT and continued home exercise. She denies any further shoulder pain.    Her CAP resolved with ABX. She notes mild continued cough but is feeling much better. She missed or was late to work a few times during her illness. She also notes that her CXR read recommend repeat CXR in 4 weeks to ensure clearing.   She also continues to smoke about 1/2 ppd. She has not tolerated chantix in the past and had hiccups with oral tobacco. She thinks that she may be able to switch to vape.     ROS as above:  Exam:  BP (!) 142/96   Pulse 74   Wt 187 lb (84.8 kg)   BMI 30.18 kg/m  Gen: Well NAD HEENT: EOMI,  MMM Lungs: Normal work of breathing. CTABL Heart: RRR no MRG Abd: NABS, Soft. Nondistended, Nontender Exts: Brisk capillary refill, warm and well perfused.  Shoulder BL normal motion without pain.    Assessment and Plan: 42 y.o. female with  Right shoulder pain resolved. Continue HEP. Recheck PRN.   CAP: Improved. Recheck CXR in 1 week per radiology recommendations. FMLA paperwork filled out and faxed today.   Smoking: Cessation discussed. Patient is still in the contemplative stage of change.   Orders Placed This Encounter  Procedures  . DG Chest 2 View    Follow up CXr from 4/29 eval clearing.    Order Specific Question:   Reason for exam:    Answer:   Cough, assess intra-thoracic pathology    Order Specific Question:   Is the patient pregnant?    Answer:   No    Order Specific Question:   Preferred imaging location?    Answer:   [facility]   No orders of the defined types were placed in this  encounter.    Historical information moved to improve visibility of documentation.  Past Medical History:  Diagnosis Date  . Acid reflux   . Depression   . GAD (generalized anxiety disorder) 02/10/2017  . Hypertension 02/10/2017  . Palpitations 02/10/2017   a. 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   . SVT (supraventricular tachycardia) ([place])   . Systolic murmur   . Tobacco use    Past Surgical History:  Procedure Laterality Date  . SACRO-ILIAC PINNING  2016   fusion, in [place] History   Tobacco Use  . Smoking status: Current Every Day Smoker    Packs/day: 0.50    Years: 22.00    Pack years: 11.00    Types: Cigarettes  . Smokeless tobacco: Never Used  Substance Use Topics  . Alcohol use: Yes   family history includes Arrhythmia in her sister; Cancer in her maternal grandfather; Depression in her mother; Diabetes in her father and paternal grandfather; Hypertension in her mother.  Medications: Current Outpatient Medications  Medication Sig Dispense Refill  . albuterol (PROVENTIL HFA;VENTOLIN HFA) 108 (90 Base) MCG/ACT inhaler Inhale 2 puffs into the lungs every 6 (six) hours as needed for wheezing. 2 Inhaler 11  . B Complex-C (B-COMPLEX WITH VITAMIN C) tablet Take 1 tablet by mouth daily.    Marland Kitchen  cholecalciferol (VITAMIN D) 1000 units tablet Take 1,000 Units by mouth daily.    . diclofenac sodium (VOLTAREN) 1 % GEL Apply 2 g topically 4 (four) times daily. To affected joint. 100 g 11  . DULoxetine (CYMBALTA) 30 MG capsule Take 1 capsule (30 mg total) by mouth daily. 30 capsule 3  . ipratropium (ATROVENT) 0.06 % nasal spray Place 2 sprays into both nostrils 4 (four) times daily. (Patient taking differently: Place 2 sprays into both nostrils as needed. ) 15 mL 12  . meloxicam (MOBIC) 15 MG tablet TAKE 1 TABLET (15 MG TOTAL) BY MOUTH DAILY. PT NEEDS TO KEEP UPCOMING APPT W/PCP. 30 tablet 0  . metoprolol succinate (TOPROL-XL) 50 MG 24 hr tablet  Take 1 tablet (50 mg total) by mouth daily. Take with or immediately following a meal. 90 tablet 3  . Multiple Vitamin (MULTIVITAMIN WITH MINERALS) TABS tablet Take 1 tablet by mouth daily.    . Omega-3 Fatty Acids (FISH OIL PO) Take 1 capsule by mouth daily.    . predniSONE (DELTASONE) 10 MG tablet Take 3 tablets (30 mg total) by mouth daily with breakfast. 15 tablet 0  . Vitamin D, Ergocalciferol, (DRISDOL) 50000 units CAPS capsule Take 1 capsule (50,000 Units total) by mouth 2 (two) times a week. 24 capsule 0   No current facility-administered medications for this visit.    Allergies  Allergen Reactions  . Fish Allergy Nausea And Vomiting    ALL SEAFOOD  . Shellfish Allergy Nausea And Vomiting    ALL SEAFOOD  . Sulfa Antibiotics Other (See Comments)    Thrush reaction    Health Maintenance Health Maintenance  Topic Date Due  . HIV Screening  06/12/1992  . INFLUENZA VACCINE  07/19/2018  . PAP SMEAR  12/25/2020  . TETANUS/TDAP  07/19/2025    Discussed warning signs or symptoms. Please see discharge instructions. Patient expresses understanding.

## 2018-05-07 NOTE — Patient Instructions (Signed)
Thank you for coming in today. Recheck with me as needed for shoulder.  For lungs get xray at work or here in about 1 week.  Recheck with Dr Sheppard Coil as scheduled or previously arranged.   Keep up home exercises.

## 2018-05-10 ENCOUNTER — Encounter: Payer: Self-pay | Admitting: Family Medicine

## 2018-05-15 ENCOUNTER — Encounter: Payer: Self-pay | Admitting: Family Medicine

## 2018-05-15 ENCOUNTER — Ambulatory Visit (HOSPITAL_COMMUNITY)
Admission: RE | Admit: 2018-05-15 | Discharge: 2018-05-15 | Disposition: A | Discharge status: Home / Self Care | Payer: 59 | Source: Ambulatory Visit | Attending: Family Medicine | Admitting: Family Medicine

## 2018-05-15 DIAGNOSIS — R062 Wheezing: Secondary | ICD-10-CM | POA: Diagnosis not present

## 2018-05-15 DIAGNOSIS — R05 Cough: Secondary | ICD-10-CM | POA: Diagnosis not present

## 2018-05-28 ENCOUNTER — Other Ambulatory Visit: Payer: 59

## 2018-06-11 ENCOUNTER — Ambulatory Visit (INDEPENDENT_AMBULATORY_CARE_PROVIDER_SITE_OTHER): Payer: 59 | Admitting: Osteopathic Medicine

## 2018-06-11 ENCOUNTER — Encounter: Payer: Self-pay | Admitting: Osteopathic Medicine

## 2018-06-11 VITALS — BP 133/86 | HR 65 | Temp 98.0°F | Wt 189.2 lb

## 2018-06-11 DIAGNOSIS — I10 Essential (primary) hypertension: Secondary | ICD-10-CM

## 2018-06-11 DIAGNOSIS — R7989 Other specified abnormal findings of blood chemistry: Secondary | ICD-10-CM | POA: Diagnosis not present

## 2018-06-11 DIAGNOSIS — Z720 Tobacco use: Secondary | ICD-10-CM

## 2018-06-11 DIAGNOSIS — Z0000 Encounter for general adult medical examination without abnormal findings: Secondary | ICD-10-CM | POA: Diagnosis not present

## 2018-06-11 DIAGNOSIS — R509 Fever, unspecified: Secondary | ICD-10-CM | POA: Diagnosis not present

## 2018-06-11 DIAGNOSIS — R768 Other specified abnormal immunological findings in serum: Secondary | ICD-10-CM

## 2018-06-11 DIAGNOSIS — M797 Fibromyalgia: Secondary | ICD-10-CM

## 2018-06-11 DIAGNOSIS — E559 Vitamin D deficiency, unspecified: Secondary | ICD-10-CM

## 2018-06-11 MED ORDER — DULOXETINE HCL 30 MG PO CPEP: 30.0000 mg | ORAL_CAPSULE | Freq: Every day | ORAL | 3 refills | Status: DC

## 2018-06-11 MED ORDER — METOPROLOL SUCCINATE ER 50 MG PO TB24: 50.0000 mg | ORAL_TABLET | Freq: Every day | ORAL | 3 refills | Status: DC

## 2018-06-11 NOTE — Progress Notes (Signed)
HPI: Deborah Goodman is a 42 y.o. female who  has a past medical history of Acid reflux, Depression, GAD (generalized anxiety disorder) (02/10/2017), Hypertension (02/10/2017), Palpitations (02/10/2017), SVT (supraventricular tachycardia) ([place]), Systolic murmur, and Tobacco use.  she presents to [facility] today, 06/11/18,  for chief complaint of: Annual physical for insurance  Patient here for annual physical / wellness exam.  See preventive care reviewed as below.  Recent labs reviewed in detail with the patient.   Additional items addressed today briefly:   Doing well w/ fibromyalgia, Cymbalta seems to be helping   Feeling better from earlier episode of pneumonia   BP was a little high at work recently, today above goal on intake, recheck was improved. No CP/SOB, no HA/VC/Dizzy.      Past medical, surgical, social and family history reviewed:  Patient Active Problem List   Diagnosis Date Noted  . Morbid obesity ([place]) 12/25/2017  . Family history of autoimmune disorder 12/18/2017  . Transaminitis 05/05/2017  . Lung nodule < 6cm on CT 05/05/2017  . Fever 04/27/2017  . PSVT (paroxysmal supraventricular tachycardia) ([place]) 03/13/2017  . Tobacco use 03/13/2017  . Palpitations 02/10/2017  . GAD (generalized anxiety disorder) 02/10/2017  . Hypertension 02/10/2017  . Abnormal thyroid blood test 02/10/2017    Past Surgical History:  Procedure Laterality Date  . SACRO-ILIAC PINNING  2016   fusion, in [place] History   Tobacco Use  . Smoking status: Current Every Day Smoker    Packs/day: 0.50    Years: 22.00    Pack years: 11.00    Types: Cigarettes  . Smokeless tobacco: Never Used  Substance Use Topics  . Alcohol use: Yes    Family History  Problem Relation Age of Onset  . Depression Mother   . Hypertension Mother   . Diabetes Father   . Arrhythmia Sister        palpitations  . Cancer Maternal Grandfather         COLON  . Diabetes Paternal Grandfather      Current medication list and allergy/intolerance information reviewed:    Current Outpatient Medications  Medication Sig Dispense Refill  . albuterol (PROVENTIL HFA;VENTOLIN HFA) 108 (90 Base) MCG/ACT inhaler Inhale 2 puffs into the lungs every 6 (six) hours as needed for wheezing. 2 Inhaler 11  . B Complex-C (B-COMPLEX WITH VITAMIN C) tablet Take 1 tablet by mouth daily.    . cholecalciferol (VITAMIN D) 1000 units tablet Take 1,000 Units by mouth daily.    . diclofenac sodium (VOLTAREN) 1 % GEL Apply 2 g topically 4 (four) times daily. To affected joint. 100 g 11  . DULoxetine (CYMBALTA) 30 MG capsule Take 1 capsule (30 mg total) by mouth daily. 30 capsule 3  . ipratropium (ATROVENT) 0.06 % nasal spray Place 2 sprays into both nostrils 4 (four) times daily. (Patient taking differently: Place 2 sprays into both nostrils as needed. ) 15 mL 12  . meloxicam (MOBIC) 15 MG tablet TAKE 1 TABLET (15 MG TOTAL) BY MOUTH DAILY. PT NEEDS TO KEEP UPCOMING APPT W/PCP. 30 tablet 0  . metoprolol succinate (TOPROL-XL) 50 MG 24 hr tablet Take 1 tablet (50 mg total) by mouth daily. Take with or immediately following a meal. 90 tablet 3  . Multiple Vitamin (MULTIVITAMIN WITH MINERALS) TABS tablet Take 1 tablet by mouth daily.    . Omega-3 Fatty Acids (FISH OIL PO) Take 1 capsule by mouth daily.    Marland Kitchen  predniSONE (DELTASONE) 10 MG tablet Take 3 tablets (30 mg total) by mouth daily with breakfast. 15 tablet 0  . Vitamin D, Ergocalciferol, (DRISDOL) 50000 units CAPS capsule Take 1 capsule (50,000 Units total) by mouth 2 (two) times a week. 24 capsule 0   No current facility-administered medications for this visit.     Allergies  Allergen Reactions  . Fish Allergy Nausea And Vomiting    ALL SEAFOOD  . Shellfish Allergy Nausea And Vomiting    ALL SEAFOOD  . Sulfa Antibiotics Other (See Comments)    Thrush reaction Sores in mouth      Review of  Systems:  Constitutional:  +intermittent fever earlier this year, no chills, No recent illness, No unintentional weight changes. No significant fatigue.   HEENT: No  headache, no vision change, no hearing change, No sore throat, No  sinus pressure  Cardiac: No  chest pain, No  pressure, No palpitations  Respiratory:  No  shortness of breath. No  Cough  Gastrointestinal: No  abdominal pain, No  nausea, No  vomiting,  No  blood in stool, No  diarrhea, No  constipation   Musculoskeletal: No new myalgia/arthralgia  Skin: No  Rash  Genitourinary: No  incontinence, No  abnormal genital bleeding, No abnormal genital discharge  Hem/Onc: No  easy bruising/bleeding  Endocrine: No cold intolerance,  No heat intolerance. No polyuria/polydipsia/polyphagia   Neurologic: No  weakness, No  dizziness  Psychiatric: No  concerns with depression, No  concerns with anxiety, No sleep problems, No mood problems  Exam:  BP 133/86 (BP Location: Left Arm, Patient Position: Sitting, Cuff Size: Normal)   Pulse 65   Temp 98 F (36.7 C) (Oral)   Wt 189 lb 3.2 oz (85.8 kg)   BMI 30.54 kg/m   Constitutional: VS see above. General Appearance: alert, well-developed, well-nourished, NAD  Eyes: Normal lids and conjunctive, non-icteric sclera  Ears, Nose, Mouth, Throat: MMM, Normal external inspection ears/nares/mouth/lips/gums. TM normal bilaterally. Pharynx/tonsils no erythema, no exudate. Nasal mucosa normal.   Neck: No masses, trachea midline. No thyroid enlargement. No tenderness/mass appreciated. No lymphadenopathy  Respiratory: Normal respiratory effort. no wheeze, no rhonchi, no rales  Cardiovascular: S1/S2 normal, no murmur, no rub/gallop auscultated. RRR. No lower extremity edema. .  Gastrointestinal: Nontender, no masses. No hepatomegaly, no splenomegaly. No hernia appreciated. Bowel sounds normal. Rectal exam deferred.   Musculoskeletal: Gait normal. No clubbing/cyanosis of digits.    Neurological: Normal balance/coordination. No tremor. No cranial nerve deficit on limited exam. Motor and sensation intact and symmetric. Cerebellar reflexes intact.   Skin: warm, dry, intact. No rash/ulcer. No concerning nevi or subq nodules on limited exam.  Psychiatric: Normal judgment/insight. Normal mood and affect. Oriented x3.      ASSESSMENT/PLAN:   Annual physical exam - Plan: CBC with Differential/Platelet, COMPLETE METABOLIC PANEL WITH GFR, Lipid panel  Hypertension, unspecified type - BP a bit above goal, discussed monitor at work, let me know if consistently >130/80 - Plan: metoprolol succinate (TOPROL-XL) 50 MG 24 hr tablet  Fibromyalgia - Plan: DULoxetine (CYMBALTA) 30 MG capsule, ANA  Vitamin D deficiency - Plan: VITAMIN D 25 Hydroxy (Vit-D Deficiency, Fractures)  Intermittent fever - Plan: CBC with Differential/Platelet, TSH  ANA positive - Plan: ANA  Abnormal thyroid blood test - Plan: TSH  Tobacco use - cessation advised   FEMALE PREVENTIVE CARE Updated 06/11/18   ANNUAL SCREENING/COUNSELING  Diet/Exercise - HEALTHY HABITS DISCUSSED TO DECREASE CV RISK Social History   Tobacco Use  Smoking Status  Current Every Day Smoker  . Packs/day: 0.50  . Years: 22.00  . Pack years: 11.00  . Types: Cigarettes  Smokeless Tobacco Never Used   Social History   Substance and Sexual Activity  Alcohol Use Yes    Depression screen PHQ 2/9 01/22/2018  Decreased Interest 1  Down, Depressed, Hopeless 1  PHQ - 2 Score 2  Altered sleeping 2  Tired, decreased energy 2  Change in appetite 1  Feeling bad or failure about yourself  1  Trouble concentrating 1  Moving slowly or fidgety/restless 0  Suicidal thoughts 0  PHQ-9 Score 9  Difficult doing work/chores Not difficult at all     Domestic violence concerns - no  HTN SCREENING - SEE VITALS  SEXUAL HEALTH  Need/want STI testing today? - no  INFECTIOUS DISEASE SCREENING  HIV - does not need  GC/CT  - does not need  HepC - DOB [birth year] - does not need  TB - does not need  DISEASE SCREENING  Lipid - needs  DM2 - needs  Osteoporosis - women age 37+ - does not need  CANCER SCREENING  Cervical - does not need - following with OBGYN, normal Pap 12/25/17 w/ Dr Hulan Fray   Breast - does not need  Lung - does not need - tobacco cessation discussed   Colon - needs - referred by Dr Hulan Fray, see pt printed instructions   ADULT VACCINATION  Influenza - annual vaccine recommended  Td - booster every 10 years   Zoster - Shingrix recommended 50+  PCV13 - was not indicated  PPSV23 - was not indicated Immunization History  Administered Date(s) Administered  . Influenza-Unspecified 10/02/2016, 10/05/2017  . Tdap 07/20/2015   OTHER  Fall - exercise and Vit D age 106+ - does not need    Visit summary with medication list and pertinent instructions was printed for patient to review. All questions at time of visit were answered - patient instructed to contact office with any additional concerns. ER/RTC precautions were reviewed with the patient.   Follow-up plan: Return in about 1 year (around 06/12/2019) for [facility], sooner if needed .    Please note: voice recognition software was used to produce this document, and typos may escape review. Please contact Dr. Sheppard Coil for any needed clarifications.

## 2018-06-11 NOTE — Patient Instructions (Addendum)
[facility]: [phone] If they aren't covered by [facility] insurance, let me know and I'll send a referral elsewhere  Review preventive care:   General  Most recent routine screening lipids/other labs: getting this today  Tobacco: quit!  Alcohol: limit 1 drink per day for women  Recreational/Illicit Drugs: don't!  Exercise: as tolerated  Mental health: if need for mental health care (medicines, counseling, other), or concerns about moods, please let me know!   Sexual health: if need for pregnancy prevention or STD testing, please let me know!   Vaccines  Flu vaccine: recommended every fall (by Halloween!)  Shingles vaccine: recommended after age 14  Pneumonia vaccines: recommended after age 78 unless other reason such as diabetes, COPD/asthma, others  Tetanus booster: recommended every 10 years  Cancer screenings   Colon cancer screening: recommended at age 55, sooner if risk factors - given your family history, you should get a colonoscopy soon!   Breast cancer screening: mammogram recommended at age 75  Cervical cancer screening: every 1 to 5 years depending on age and other risk factors  Infection screenings . HIV: recommended screening at least once age 50-65, more often if risk factors  . Gonorrhea/Chlamydia: many insurances require testing for anyone on birth control pills, otherwise screening as needed  . Hepatitis C: recommended for anyone born 06-1963  Other . Aspirin: don't worry about this  . Bone Density Test: recommended for women at age 57, men at age 31, sooner depending on risk factors . Advanced Directive: Living Will and/or Healthcare Power of Attorney recommended for everyone, regardless of age or health . Cholesterol: recommended annually . Diabetes: recommended annually  . Thyroid and Vitamin D: routine screening not recommended in absence of symptoms,  but given that you have had these tests in abnormal range in he past, we will  follow up on these

## 2018-06-13 ENCOUNTER — Other Ambulatory Visit: Payer: Self-pay | Admitting: Osteopathic Medicine

## 2018-06-13 LAB — COMPLETE METABOLIC PANEL WITH GFR
AG Ratio: 1.5 (calc) (ref 1.0–2.5)
ALKALINE PHOSPHATASE (APISO): 79 U/L (ref 33–115)
ALT: 51 U/L — ABNORMAL HIGH (ref 6–29)
AST: 47 U/L — AB (ref 10–30)
Albumin: 4.3 g/dL (ref 3.6–5.1)
BUN: 8 mg/dL (ref 7–25)
CALCIUM: 9.2 mg/dL (ref 8.6–10.2)
CO2: 28 mmol/L (ref 20–32)
Chloride: 102 mmol/L (ref 98–110)
Creat: 0.69 mg/dL (ref 0.50–1.10)
GFR, EST NON AFRICAN AMERICAN: 109 mL/min/{1.73_m2} (ref >=60)
GFR, Est African American: 126 mL/min/{1.73_m2} (ref >=60)
GLOBULIN: 2.8 g/dL (ref 1.9–3.7)
GLUCOSE: 90 mg/dL (ref 65–99)
Potassium: 3.9 mmol/L (ref 3.5–5.3)
SODIUM: 137 mmol/L (ref 135–146)
Total Bilirubin: 0.7 mg/dL (ref 0.2–1.2)
Total Protein: 7.1 g/dL (ref 6.1–8.1)

## 2018-06-13 LAB — CBC WITH DIFFERENTIAL/PLATELET
Basophils Absolute: 52 cells/uL (ref 0–200)
Basophils Relative: 0.6 %
EOS PCT: 3.4 %
Eosinophils Absolute: 292 cells/uL (ref 15–500)
HEMATOCRIT: 41.1 % (ref 35.0–45.0)
HEMOGLOBIN: 14.2 g/dL (ref 11.7–15.5)
LYMPHS ABS: 3285 {cells}/uL (ref 850–3900)
MCH: 31.1 pg (ref 27.0–33.0)
MCHC: 34.5 g/dL (ref 32.0–36.0)
MCV: 90.1 fL (ref 80.0–100.0)
MONOS PCT: 7.3 %
MPV: 8.9 fL (ref 7.5–12.5)
NEUTROS ABS: 4343 {cells}/uL (ref 1500–7800)
Neutrophils Relative %: 50.5 %
Platelets: 308 10*3/uL (ref 140–400)
RBC: 4.56 10*6/uL (ref 3.80–5.10)
RDW: 12.8 % (ref 11.0–15.0)
Total Lymphocyte: 38.2 %
WBC mixed population: 628 cells/uL (ref 200–950)
WBC: 8.6 10*3/uL (ref 3.8–10.8)

## 2018-06-13 LAB — LIPID PANEL
CHOL/HDL RATIO: 4.5 (calc) (ref <5.0)
CHOLESTEROL: 270 mg/dL — AB (ref <200)
HDL: 60 mg/dL (ref >50)
LDL Cholesterol (Calc): 180 mg/dL (calc) — ABNORMAL HIGH
NON-HDL CHOLESTEROL (CALC): 210 mg/dL — AB (ref <130)
TRIGLYCERIDES: 154 mg/dL — AB (ref <150)

## 2018-06-13 LAB — ANTI-NUCLEAR AB-TITER (ANA TITER)

## 2018-06-13 LAB — ANA: Anti Nuclear Antibody(ANA): POSITIVE — AB

## 2018-06-13 LAB — TSH: TSH: 2.68 mIU/L

## 2018-06-13 LAB — VITAMIN D 25 HYDROXY (VIT D DEFICIENCY, FRACTURES): VIT D 25 HYDROXY: 22 ng/mL — AB (ref 30–100)

## 2018-06-13 MED ORDER — VITAMIN D (ERGOCALCIFEROL) 1.25 MG (50000 UNIT) PO CAPS
50000.0000 [IU] | ORAL_CAPSULE | ORAL | 0 refills | Status: DC
Start: 2018-06-13 — End: 2018-10-31

## 2018-06-13 MED FILL — VIT D2 1.25 MG (50,000 UNIT: 1.25 MG | 84 days supply | Qty: 12 | Fill #0

## 2018-06-13 NOTE — Progress Notes (Signed)
See lab note.  

## 2018-06-14 ENCOUNTER — Encounter: Payer: Self-pay | Admitting: Osteopathic Medicine

## 2018-06-22 MED FILL — DULoxetine HCL 30 MG CPEP: 30 | 90 days supply | Qty: 90 | Fill #0

## 2018-06-22 MED FILL — METOPROLOL SUCCINATE ER 50: 50 | 90 days supply | Qty: 90 | Fill #0

## 2018-07-23 ENCOUNTER — Encounter: Payer: Self-pay | Admitting: Osteopathic Medicine

## 2018-07-23 DIAGNOSIS — G8929 Other chronic pain: Secondary | ICD-10-CM | POA: Insufficient documentation

## 2018-07-23 DIAGNOSIS — M25511 Pain in right shoulder: Principal | ICD-10-CM

## 2018-07-23 NOTE — Telephone Encounter (Signed)
No prob.  MRI ordered.

## 2018-07-30 ENCOUNTER — Encounter: Payer: Self-pay | Admitting: Sports Medicine

## 2018-07-30 ENCOUNTER — Ambulatory Visit (INDEPENDENT_AMBULATORY_CARE_PROVIDER_SITE_OTHER): Payer: 59

## 2018-07-30 ENCOUNTER — Ambulatory Visit (INDEPENDENT_AMBULATORY_CARE_PROVIDER_SITE_OTHER): Payer: 59 | Admitting: Sports Medicine

## 2018-07-30 DIAGNOSIS — G8929 Other chronic pain: Secondary | ICD-10-CM | POA: Diagnosis not present

## 2018-07-30 DIAGNOSIS — M75101 Unspecified rotator cuff tear or rupture of right shoulder, not specified as traumatic: Secondary | ICD-10-CM

## 2018-07-30 DIAGNOSIS — M25511 Pain in right shoulder: Secondary | ICD-10-CM

## 2018-07-30 DIAGNOSIS — F411 Generalized anxiety disorder: Secondary | ICD-10-CM

## 2018-07-30 MED ORDER — SERTRALINE HCL 25 MG PO TABS: 25.0000 mg | ORAL_TABLET | Freq: Every day | ORAL | 2 refills | Status: DC

## 2018-07-30 MED FILL — SERTRALINE HCL 25 MG TABLET: 25 | 30 days supply | Qty: 30 | Fill #0

## 2018-07-30 NOTE — Assessment & Plan Note (Signed)
Hyperhidrosis with Cymbalta, I heard this from a few patients here and there. Discontinue and starting Zoloft 25, advised to follow-up with PCP for dose titration.

## 2018-07-30 NOTE — Assessment & Plan Note (Signed)
Articular sided supraspinatus tears, previous injection was 4 to 5 months ago, subacromial. She has been moderately consistent with rehab exercises. Repeat injection today, this time glenohumeral, referral to Dr. Griffin Basil for discussion of arthroscopy. Return in 1 month.

## 2018-07-30 NOTE — Progress Notes (Signed)
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  Pain is over the deltoid, moderate, persistent, she had a single subacromial injection that provided good relief.  This was about 4 or 5 months ago, now having recurrence of pain of the deltoid, worse with overhead activities.  No recent trauma.  Desires repeat injection today.  She has been moderately diligent with her rehab exercises.  I reviewed the past medical history, family history, social history, surgical history, and allergies today and no changes were needed.  Please see the problem list section below in epic for further details.  Past Medical History: Past Medical History:  Diagnosis Date  . Acid reflux   . Depression   . GAD (generalized anxiety disorder) 02/10/2017  . Hypertension 02/10/2017  . Palpitations 02/10/2017   a. 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   . SVT (supraventricular tachycardia) ([place])   . Systolic murmur   . Tobacco use    Past Surgical History: Past Surgical History:  Procedure Laterality Date  . SACRO-ILIAC PINNING  2016   fusion, in [place] History: Social History   Socioeconomic History  . Marital status: Single    Spouse name: Not on file  . Number of children: Not on file  . Years of education: Not on file  . Highest education level: Not on file  Occupational History  . Occupation: Financial planner: [employer]  Social Needs  . Financial resource strain: Not on file  . Food insecurity:    Worry: Not on file    Inability: Not on file  . Transportation needs:    Medical: Not on file    Non-medical: Not on file  Tobacco Use  . Smoking status: Current Every Day Smoker    Packs/day: 0.50    Years: 22.00    Pack years: 11.00    Types: Cigarettes  . Smokeless  tobacco: Never Used  Substance and Sexual Activity  . Alcohol use: Yes  . Drug use: No  . Sexual activity: Never    Birth control/protection: Condom  Lifestyle  . Physical activity:    Days per week: Not on file    Minutes per session: Not on file  . Stress: Not on file  Relationships  . Social connections:    Talks on phone: Not on file    Gets together: Not on file    Attends religious service: Not on file    Active member of club or organization: Not on file    Attends meetings of clubs or organizations: Not on file    Relationship status: Not on file  Other Topics Concern  . Not on file  Social History Narrative  . Not on file   Family History: Family History  Problem Relation Age of Onset  . Depression Mother   . Hypertension Mother   . Cancer Mother        colon  . Diabetes Father   . Arrhythmia Sister        palpitations  . Cancer Maternal Grandfather        COLON  . Diabetes Paternal Grandfather    Allergies: Allergies  Allergen Reactions  . Fish Allergy Nausea And Vomiting    ALL SEAFOOD  . Shellfish Allergy  Nausea And Vomiting    ALL SEAFOOD  . Sulfa Antibiotics Other (See Comments)    Thrush reaction Sores in mouth   Medications: See med rec.  Review of Systems: No fevers, chills, night sweats, weight loss, chest pain, or shortness of breath.   Objective:    General: Well Developed, well nourished, and in no acute distress.  Neuro: Alert and oriented x3, extra-ocular muscles intact, sensation grossly intact.  HEENT: Normocephalic, atraumatic, pupils equal round reactive to light, neck supple, no masses, no lymphadenopathy, thyroid nonpalpable.  Skin: Warm and dry, no rashes. Cardiac: Regular rate and rhythm, no murmurs rubs or gallops, no lower extremity edema.  Respiratory: Clear to auscultation bilaterally. Not using accessory muscles, speaking in full sentences. Right shoulder: Inspection reveals no abnormalities, atrophy or  asymmetry. Palpation is normal with no tenderness over AC joint or bicipital groove. ROM is full in all planes. Rotator cuff strength normal throughout. Positive Neer and Hawkin's tests, empty can. Speeds and Yergason's tests normal. No labral pathology noted with negative Obrien's, negative crank, negative clunk, and good stability. Normal scapular function observed. No painful arc and no drop arm sign. No apprehension sign  MRI reviewed, there is an articular sided partial-thickness tear of the supraspinatus and infraspinatus.  Procedure: Real-time Ultrasound Guided Injection of right glenohumeral joint Device: GE Logiq E  Verbal informed consent obtained.  Time-out conducted.  Noted no overlying erythema, induration, or other signs of local infection.  Skin prepped in a sterile fashion.  Local anesthesia: Topical Ethyl chloride.  With sterile technique and under real time ultrasound guidance: Using a posterior approach I advanced into the glenohumeral joint, injected 1 cc kenalog 40, 2 cc lidocaine, 2 cc bupivacaine. Completed without difficulty  Pain immediately resolved suggesting accurate placement of the medication.  Advised to call if fevers/chills, erythema, induration, drainage, or persistent bleeding.  Images permanently stored and available for review in the ultrasound unit.  Impression: Technically successful ultrasound guided injection.  Impression and Recommendations:    Chronic right shoulder pain Articular sided supraspinatus tears, previous injection was 4 to 5 months ago, subacromial. She has been moderately consistent with rehab exercises. Repeat injection today, this time glenohumeral, referral to Dr. Griffin Basil for discussion of arthroscopy. Return in 1 month.  GAD (generalized anxiety disorder) Hyperhidrosis with Cymbalta, I heard this from a few patients here and there. Discontinue and starting Zoloft 25, advised to follow-up with PCP for dose  titration.  ___________________________________________ Gwen Her. Dianah Field, M.D., ABFM., CAQSM. Primary Care and [place] Instructor of [place] [facility]

## 2018-07-31 DIAGNOSIS — M25511 Pain in right shoulder: Secondary | ICD-10-CM | POA: Diagnosis not present

## 2018-08-02 ENCOUNTER — Encounter: Payer: Self-pay | Admitting: Osteopathic Medicine

## 2018-08-02 ENCOUNTER — Encounter: Payer: Self-pay | Admitting: Sports Medicine

## 2018-08-06 ENCOUNTER — Ambulatory Visit: Payer: 59 | Admitting: Sports Medicine

## 2018-08-07 ENCOUNTER — Encounter: Payer: Self-pay | Admitting: Osteopathic Medicine

## 2018-08-07 DIAGNOSIS — M545 Low back pain, unspecified: Secondary | ICD-10-CM

## 2018-08-08 ENCOUNTER — Telehealth: Payer: Self-pay | Admitting: Osteopathic Medicine

## 2018-08-08 NOTE — Telephone Encounter (Signed)
Pt has been instructed to RTC for visit to address concerns and discuss FLMA - I see she has a follow-up with me and also w/ Dr T soon. Just to update on the FMLA, I sent FMLA cover sheet back to [employer] w/ notation that they could expect full paperwork completed after the visit in the office.

## 2018-08-09 NOTE — Telephone Encounter (Signed)
Xrays ordered.

## 2018-08-13 ENCOUNTER — Ambulatory Visit: Payer: 59 | Admitting: Osteopathic Medicine

## 2018-08-15 ENCOUNTER — Encounter: Payer: Self-pay | Admitting: Sports Medicine

## 2018-08-15 ENCOUNTER — Ambulatory Visit (INDEPENDENT_AMBULATORY_CARE_PROVIDER_SITE_OTHER): Payer: 59 | Admitting: Sports Medicine

## 2018-08-15 ENCOUNTER — Encounter: Payer: Self-pay | Admitting: Osteopathic Medicine

## 2018-08-15 ENCOUNTER — Ambulatory Visit (INDEPENDENT_AMBULATORY_CARE_PROVIDER_SITE_OTHER): Payer: 59

## 2018-08-15 DIAGNOSIS — M545 Low back pain: Secondary | ICD-10-CM

## 2018-08-15 DIAGNOSIS — M25551 Pain in right hip: Secondary | ICD-10-CM | POA: Diagnosis not present

## 2018-08-15 DIAGNOSIS — M259 Joint disorder, unspecified: Secondary | ICD-10-CM | POA: Diagnosis not present

## 2018-08-15 DIAGNOSIS — M25559 Pain in unspecified hip: Secondary | ICD-10-CM | POA: Diagnosis not present

## 2018-08-15 DIAGNOSIS — F411 Generalized anxiety disorder: Secondary | ICD-10-CM

## 2018-08-15 DIAGNOSIS — S3992XA Unspecified injury of lower back, initial encounter: Secondary | ICD-10-CM | POA: Diagnosis not present

## 2018-08-15 DIAGNOSIS — S79911A Unspecified injury of right hip, initial encounter: Secondary | ICD-10-CM | POA: Diagnosis not present

## 2018-08-15 MED ORDER — SERTRALINE HCL 50 MG PO TABS: 50.0000 mg | ORAL_TABLET | Freq: Every day | ORAL | 3 refills | Status: DC

## 2018-08-15 MED ORDER — TRAMADOL HCL 50 MG PO TABS: 50.0000 mg | ORAL_TABLET | Freq: Three times a day (TID) | ORAL | 0 refills | Status: DC | PRN

## 2018-08-15 MED ORDER — PREDNISONE 50 MG PO TABS: ORAL_TABLET | ORAL | 0 refills | Status: DC

## 2018-08-15 MED FILL — predniSONE 50 MG TABS: 50 | 5 days supply | Qty: 5 | Fill #0

## 2018-08-15 MED FILL — SERTRALINE HCL 50 MG TABLET: 50 | 30 days supply | Qty: 30 | Fill #0

## 2018-08-15 MED FILL — traMADol HCL 50 MG TABS: 50 | 7 days supply | Qty: 21 | Fill #0

## 2018-08-15 NOTE — Assessment & Plan Note (Signed)
X-rays of the sacrum, coccyx, lumbar spine and hip/pelvis.

## 2018-08-15 NOTE — Assessment & Plan Note (Signed)
Injury almost 2 weeks ago, clinically this represents a hip abductor strain. She is post SI joint fusion several years ago. Adding hip abductor rehabilitation exercises, 5 days of prednisone, tramadol. Return to see me in 3 to 4 weeks, hip abductor and trochanteric bursa injection if no better. She has not yet gotten her x-rays.

## 2018-08-15 NOTE — Progress Notes (Signed)
Subjective:    I'm seeing this patient as a consultation for: Dr. Emeterio Reeve  CC: Right hip pain  HPI: This is a pleasant 42 year old female, about 2 to 3 years ago she had a right sacroiliac joint fusion with good relief of her SI joint mediated pain.  She had a normal lumbar spine MRI at that time.  A week and 1/2 to 2 weeks ago she was cleaning some water off of the floor and slipped, abductor in her right hip.  Since then she is a pain that she localizes in the buttock with radiation to the right lateral hip occasionally to the knee but not past the knee, nothing radicular down to the foot.  No bowel or bladder dysfunction, saddle numbness, constitutional symptoms, overall feeling slightly better.  She has developed somewhat of a limp.  I reviewed the past medical history, family history, social history, surgical history, and allergies today and no changes were needed.  Please see the problem list section below in epic for further details.  Past Medical History: Past Medical History:  Diagnosis Date  . Acid reflux   . Depression   . GAD (generalized anxiety disorder) 02/10/2017  . Hypertension 02/10/2017  . Palpitations 02/10/2017   a. 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   . SVT (supraventricular tachycardia) ([place])   . Systolic murmur   . Tobacco use    Past Surgical History: Past Surgical History:  Procedure Laterality Date  . SACRO-ILIAC PINNING  2016   fusion, in [place] History: Social History   Socioeconomic History  . Marital status: Single    Spouse name: Not on file  . Number of children: Not on file  . Years of education: Not on file  . Highest education level: Not on file  Occupational History  . Occupation: Financial planner: [employer]  Social Needs  . Financial resource strain: Not on file  . Food insecurity:    Worry: Not on file    Inability: Not on file  . Transportation needs:    Medical: Not on  file    Non-medical: Not on file  Tobacco Use  . Smoking status: Current Every Day Smoker    Packs/day: 0.50    Years: 22.00    Pack years: 11.00    Types: Cigarettes  . Smokeless tobacco: Never Used  Substance and Sexual Activity  . Alcohol use: Yes  . Drug use: No  . Sexual activity: Never    Birth control/protection: Condom  Lifestyle  . Physical activity:    Days per week: Not on file    Minutes per session: Not on file  . Stress: Not on file  Relationships  . Social connections:    Talks on phone: Not on file    Gets together: Not on file    Attends religious service: Not on file    Active member of club or organization: Not on file    Attends meetings of clubs or organizations: Not on file    Relationship status: Not on file  Other Topics Concern  . Not on file  Social History Narrative  . Not on file   Family History: Family History  Problem Relation Age of Onset  . Depression Mother   . Hypertension Mother   . Cancer Mother        colon  . Diabetes Father   . Arrhythmia Sister  palpitations  . Cancer Maternal Grandfather        COLON  . Diabetes Paternal Grandfather    Allergies: Allergies  Allergen Reactions  . Fish Allergy Nausea And Vomiting    ALL SEAFOOD  . Shellfish Allergy Nausea And Vomiting    ALL SEAFOOD  . Sulfa Antibiotics Other (See Comments)    Thrush reaction Sores in mouth   Medications: See med rec.  Review of Systems: No headache, visual changes, nausea, vomiting, diarrhea, constipation, dizziness, abdominal pain, skin rash, fevers, chills, night sweats, weight loss, swollen lymph nodes, body aches, joint swelling, muscle aches, chest pain, shortness of breath, mood changes, visual or auditory hallucinations.   Objective:   General: Well Developed, well nourished, and in no acute distress.  Neuro:  Extra-ocular muscles intact, able to move all 4 extremities, sensation grossly intact.  Deep tendon reflexes tested were  normal. Psych: Alert and oriented, mood congruent with affect. ENT:  Ears and nose appear unremarkable.  Hearing grossly normal. Neck: Unremarkable overall appearance, trachea midline.  No visible thyroid enlargement. Eyes: Conjunctivae and lids appear unremarkable.  Pupils equal and round. Skin: Warm and dry, no rashes noted.  Cardiovascular: Pulses palpable, no extremity edema. Right hip: ROM IR: 60 Deg, ER: 60 Deg, Flexion: 120 Deg, Extension: 100 Deg, Abduction: 45 Deg, Adduction: 45 Deg Strength IR: 5/5, ER: 5/5, Flexion: 5/5, Extension: 5/5, Abduction: 5/5, Adduction: 5/5 Pelvic alignment unremarkable to inspection and palpation. Standing hip rotation and gait without trendelenburg / unsteadiness. Tenderness over the piriformis and hip abductor's, tender over the greater trochanter with severe weakness to hip abduction, and with reproduction of pain. No SI joint tenderness and normal minimal SI movement.  Impression and Recommendations:   This case required medical decision making of moderate complexity.  Acute right hip pain Injury almost 2 weeks ago, clinically this represents a hip abductor strain. She is post SI joint fusion several years ago. Adding hip abductor rehabilitation exercises, 5 days of prednisone, tramadol. Return to see me in 3 to 4 weeks, hip abductor and trochanteric bursa injection if no better. She has not yet gotten her x-rays.  GAD (generalized anxiety disorder) Hyperhidrosis with Cymbalta, insufficient control with Zoloft 25, increasing to 50 mg. I did advise her to follow-up with her PCP for dose titration of mood medications but she is back here with me today.  Status post right sacroiliac joint fusion X-rays of the sacrum, coccyx, lumbar spine and hip/pelvis.  ___________________________________________ Gwen Her. Dianah Field, M.D., ABFM., CAQSM. Primary Care and [place] Instructor of [employer] [facility]

## 2018-08-15 NOTE — Assessment & Plan Note (Signed)
Hyperhidrosis with Cymbalta, insufficient control with Zoloft 25, increasing to 50 mg. I did advise her to follow-up with her PCP for dose titration of mood medications but she is back here with me today.

## 2018-08-19 DIAGNOSIS — M8700 Idiopathic aseptic necrosis of unspecified bone: Secondary | ICD-10-CM

## 2018-08-19 HISTORY — DX: Idiopathic aseptic necrosis of unspecified bone: M87.00

## 2018-08-20 ENCOUNTER — Encounter: Payer: Self-pay | Admitting: Sports Medicine

## 2018-08-21 ENCOUNTER — Encounter: Payer: Self-pay | Admitting: Family Medicine

## 2018-08-21 ENCOUNTER — Encounter: Payer: Self-pay | Admitting: Osteopathic Medicine

## 2018-08-21 ENCOUNTER — Other Ambulatory Visit: Payer: Self-pay | Admitting: Sports Medicine

## 2018-08-21 DIAGNOSIS — G8929 Other chronic pain: Secondary | ICD-10-CM | POA: Diagnosis not present

## 2018-08-21 DIAGNOSIS — F17200 Nicotine dependence, unspecified, uncomplicated: Secondary | ICD-10-CM | POA: Diagnosis not present

## 2018-08-21 DIAGNOSIS — T50905A Adverse effect of unspecified drugs, medicaments and biological substances, initial encounter: Secondary | ICD-10-CM | POA: Diagnosis not present

## 2018-08-21 DIAGNOSIS — I10 Essential (primary) hypertension: Secondary | ICD-10-CM | POA: Diagnosis not present

## 2018-08-21 DIAGNOSIS — Z79899 Other long term (current) drug therapy: Secondary | ICD-10-CM | POA: Diagnosis not present

## 2018-08-21 DIAGNOSIS — R0789 Other chest pain: Secondary | ICD-10-CM | POA: Diagnosis not present

## 2018-08-21 DIAGNOSIS — R457 State of emotional shock and stress, unspecified: Secondary | ICD-10-CM | POA: Diagnosis not present

## 2018-08-21 DIAGNOSIS — F10929 Alcohol use, unspecified with intoxication, unspecified: Secondary | ICD-10-CM | POA: Diagnosis not present

## 2018-08-21 DIAGNOSIS — M25519 Pain in unspecified shoulder: Secondary | ICD-10-CM | POA: Diagnosis not present

## 2018-08-21 DIAGNOSIS — T404X5A Adverse effect of other synthetic narcotics, initial encounter: Secondary | ICD-10-CM | POA: Diagnosis not present

## 2018-08-21 DIAGNOSIS — W19XXXA Unspecified fall, initial encounter: Secondary | ICD-10-CM | POA: Diagnosis not present

## 2018-08-21 DIAGNOSIS — M25511 Pain in right shoulder: Secondary | ICD-10-CM | POA: Diagnosis not present

## 2018-08-21 DIAGNOSIS — R531 Weakness: Secondary | ICD-10-CM | POA: Diagnosis not present

## 2018-08-21 DIAGNOSIS — Z882 Allergy status to sulfonamides status: Secondary | ICD-10-CM | POA: Diagnosis not present

## 2018-08-21 DIAGNOSIS — F419 Anxiety disorder, unspecified: Secondary | ICD-10-CM | POA: Diagnosis not present

## 2018-08-22 ENCOUNTER — Ambulatory Visit: Payer: 59 | Admitting: Sports Medicine

## 2018-08-23 ENCOUNTER — Ambulatory Visit (INDEPENDENT_AMBULATORY_CARE_PROVIDER_SITE_OTHER): Payer: 59 | Admitting: Family Medicine

## 2018-08-23 ENCOUNTER — Encounter: Payer: Self-pay | Admitting: Family Medicine

## 2018-08-23 DIAGNOSIS — M25551 Pain in right hip: Secondary | ICD-10-CM | POA: Diagnosis not present

## 2018-08-23 MED ORDER — PREDNISONE 5 MG (48) PO TBPK: ORAL_TABLET | ORAL | 0 refills | Status: DC

## 2018-08-23 MED ORDER — PREDNISONE 50 MG PO TABS: ORAL_TABLET | ORAL | 0 refills | Status: DC

## 2018-08-23 NOTE — Progress Notes (Signed)
Deborah Goodman is a 42 y.o. female who presents to [place] today for right lateral hip pain.  Deborah Goodman notes significant pain in her right lateral hip.  She denies any injury.  She was seen by my partner Dr. Darene Lamer on August 28 for right lateral hip pain.  At that point she was thought to have hip abductor tendinitis or trochanteric bursitis.  She had a trial of some home exercises which have not helped much at all.  She notes that she is quite painful in the lateral hip and having trouble with prolonged standing squatting and laying on her right side.  The pain is bothersome at work.  Simultaneous to her hip pain is her right shoulder pain.  She has significant right shoulder pain and failed conservative management.  MRI on August 12 revealed rotator cuff tear.  She has an initial appointment with orthopedics tomorrow to discuss surgical options.  She notes her shoulder pain is also significant and limiting her ability to work.  Dr. Darene Lamer prescribed a 5-day prednisone burst on August 28 which did help her shoulder and hip pain but when it wore off the pain returned.  She is worried about her ability to work off of prednisone.    ROS:  As above  Exam:  BP 116/81   Pulse 80   Temp 98.1 F (36.7 C) (Oral)   Wt 185 lb (83.9 kg)   BMI 29.86 kg/m  General: Well Developed, well nourished, and in no acute distress.  Neuro/Psych: Alert and oriented x3, extra-ocular muscles intact, able to move all 4 extremities, sensation grossly intact. Skin: Warm and dry, no rashes noted.  Respiratory: Not using accessory muscles, speaking in full sentences, trachea midline.  Cardiovascular: Pulses palpable, no extremity edema. Abdomen: Does not appear distended. MSK:  Right hip: Normal-appearing normal motion. Tender palpation greater trochanter.  Hip abduction strength diminished 4/5. Capillary refill and sensation are intact distal bilateral lower  extremities. Antalgic gait present.    Lab and Radiology Results EXAM: DG HIP (WITH OR WITHOUT PELVIS) 2-3V RIGHT  COMPARISON:  None.  FINDINGS: There is no evidence of hip fracture or dislocation. There is no evidence of arthropathy or other focal bone abnormality.  IMPRESSION: Normal right hip.   Electronically Signed   By: Marijo Conception, M.D.   On: 08/15/2018 14:31  Hip greater trochanteric injection: right Consent obtained and timeout performed. Area of maximum tenderness palpated and identified. Skin cleaned with alcohol, cold spray applied. A spinal needle was used to access the greater trochanteric bursa. 80 mg of Depo-Medrol, and 4 mL of lidocaine were used to inject the trochanteric bursa. Patient tolerated the procedure well.     Assessment and Plan: 42 y.o. female with  Right lateral hip pain is trochanteric bursitis.  Failing initial early conservative management.  Plan to reemphasize home exercise program and proceed with injection as above. Discussed work restrictions.  Currently she thinks she is going to be able to work tonight but if she can modify her work restrictions.  Certainly in the medium term her restrictions are cannot dramatically change following anticipated shoulder surgery.  We will work with her PCP, Dr. Darene Lamer, and Dr. Griffin Basil (orthopedics) regarding work notes, FMLA forms etc.  Will CC above providers.  Additionally will print a back-up prescription of a prednisone tapering course.  If surgery can be done in the near future she can start taking that in a few days if her  pain returns in either her hip or her shoulder that debilitates her and prevents her from working.  Discussed that would like to avoid steroids if possible.    No orders of the defined types were placed in this encounter.  Meds ordered this encounter  Medications  . DISCONTD: predniSONE (DELTASONE) 50 MG tablet    Sig: One tab PO daily for 5 days.    Dispense:  5  tablet    Refill:  0  . predniSONE (STERAPRED UNI-PAK 48 TAB) 5 MG (48) TBPK tablet    Sig: 12 day dosepack po    Dispense:  48 tablet    Refill:  0    Historical information moved to improve visibility of documentation.  Past Medical History:  Diagnosis Date  . Acid reflux   . Depression   . GAD (generalized anxiety disorder) 02/10/2017  . Hypertension 02/10/2017  . Palpitations 02/10/2017   a. 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   . SVT (supraventricular tachycardia) ([place])   . Systolic murmur   . Tobacco use    Past Surgical History:  Procedure Laterality Date  . SACRO-ILIAC PINNING  2016   fusion, in [place] History   Tobacco Use  . Smoking status: Current Every Day Smoker    Packs/day: 0.50    Years: 22.00    Pack years: 11.00    Types: Cigarettes  . Smokeless tobacco: Never Used  Substance Use Topics  . Alcohol use: Yes   family history includes Arrhythmia in her sister; Cancer in her maternal grandfather and mother; Depression in her mother; Diabetes in her father and paternal grandfather; Hypertension in her mother.  Medications: Current Outpatient Medications  Medication Sig Dispense Refill  . albuterol (PROVENTIL HFA;VENTOLIN HFA) 108 (90 Base) MCG/ACT inhaler Inhale 2 puffs into the lungs every 6 (six) hours as needed for wheezing. 2 Inhaler 11  . B Complex-C (B-COMPLEX WITH VITAMIN C) tablet Take 1 tablet by mouth daily.    . cholecalciferol (VITAMIN D) 1000 units tablet Take 1,000 Units by mouth daily.    . diclofenac sodium (VOLTAREN) 1 % GEL Apply 2 g topically 4 (four) times daily. To affected joint. 100 g 11  . ipratropium (ATROVENT) 0.06 % nasal spray Place 2 sprays into both nostrils 4 (four) times daily. (Patient taking differently: Place 2 sprays into both nostrils as needed. ) 15 mL 12  . metoprolol succinate (TOPROL-XL) 50 MG 24 hr tablet Take 1 tablet (50 mg total) by mouth daily. Take with or immediately  following a meal. 90 tablet 3  . Multiple Vitamin (MULTIVITAMIN WITH MINERALS) TABS tablet Take 1 tablet by mouth daily.    . Omega-3 Fatty Acids (FISH OIL PO) Take 1 capsule by mouth daily.    . sertraline (ZOLOFT) 50 MG tablet Take 1 tablet (50 mg total) by mouth daily. 30 tablet 3  . Vitamin D, Ergocalciferol, (DRISDOL) 50000 units CAPS capsule Take 1 capsule (50,000 Units total) by mouth every 7 (seven) days. For 12 weeks 12 capsule 0  . predniSONE (STERAPRED UNI-PAK 48 TAB) 5 MG (48) TBPK tablet 12 day dosepack po 48 tablet 0   No current facility-administered medications for this visit.    Allergies  Allergen Reactions  . Fish Allergy Nausea And Vomiting    ALL SEAFOOD  . Shellfish Allergy Nausea And Vomiting    ALL SEAFOOD  . Sulfa Antibiotics Other (See Comments)    Thrush reaction Sores in  mouth      Discussed warning signs or symptoms. Please see discharge instructions. Patient expresses understanding.

## 2018-08-23 NOTE — Patient Instructions (Addendum)
Thank you for coming in today.  Take prednisone as a backup.   We will adjust work note   Let me know what happens with Dr Griffin Basil.

## 2018-08-24 ENCOUNTER — Encounter: Payer: Self-pay | Admitting: Family Medicine

## 2018-08-24 DIAGNOSIS — M25511 Pain in right shoulder: Secondary | ICD-10-CM | POA: Diagnosis not present

## 2018-08-24 MED FILL — predniSONE 5 MG TABS: 5 | 12 days supply | Qty: 48 | Fill #0

## 2018-09-03 ENCOUNTER — Ambulatory Visit: Payer: 59 | Admitting: Sports Medicine

## 2018-09-04 ENCOUNTER — Encounter: Payer: Self-pay | Admitting: Family Medicine

## 2018-09-04 ENCOUNTER — Ambulatory Visit: Payer: 59 | Admitting: Family Medicine

## 2018-09-06 ENCOUNTER — Ambulatory Visit: Payer: 59 | Admitting: Family Medicine

## 2018-09-07 ENCOUNTER — Ambulatory Visit (INDEPENDENT_AMBULATORY_CARE_PROVIDER_SITE_OTHER): Payer: 59 | Admitting: Family Medicine

## 2018-09-07 VITALS — BP 141/92 | HR 86 | Ht 66.0 in | Wt 187.0 lb

## 2018-09-07 DIAGNOSIS — M7061 Trochanteric bursitis, right hip: Secondary | ICD-10-CM | POA: Diagnosis not present

## 2018-09-07 DIAGNOSIS — I10 Essential (primary) hypertension: Secondary | ICD-10-CM

## 2018-09-07 DIAGNOSIS — M76891 Other specified enthesopathies of right lower limb, excluding foot: Secondary | ICD-10-CM | POA: Diagnosis not present

## 2018-09-07 MED ORDER — MELOXICAM 15 MG PO TABS: 15.0000 mg | ORAL_TABLET | Freq: Every day | ORAL | 0 refills | Status: DC | PRN

## 2018-09-07 MED ORDER — CYCLOBENZAPRINE HCL 10 MG PO TABS: 5.0000 mg | ORAL_TABLET | Freq: Every day | ORAL | 0 refills | Status: DC

## 2018-09-07 MED FILL — MELOXICAM 15 MG TABLET: 15 | 30 days supply | Qty: 30 | Fill #0

## 2018-09-07 MED FILL — CYCLOBENZAPRINE HCL 10 MG T: 10 | 90 days supply | Qty: 90 | Fill #0

## 2018-09-07 NOTE — Patient Instructions (Signed)
Thank you for coming in today. You should hear about hip MRI and from Physical Therapy.  Do the standing stretch when able.  Continue side leg raises and stretching Recheck as needed.  I will contact you with results.

## 2018-09-07 NOTE — Progress Notes (Signed)
Deborah Goodman is a 42 y.o. female who presents to [place] today for right lateral hip pain.    Breauna has been seen several times for right lateral hip pain recently.  She is thought to have trochanteric bursitis versus hip abductor tendinopathy.  X-rays obtained in late August were unremarkable.  She had a trial of oral steroids in late August as well as a non-guided trochanteric bursa injection on September 5.  She notes the steroids and injection were only temporarily helpful.  She notes persistent right lateral hip pain worse with standing from a sitting position prolonged standing walking and laying on her right side.  She has some pain referring down the lateral leg to the knee but not pain beyond the knee.  No bowel bladder dysfunction.  Patient has had pain now and failing conservative management for greater than 6 weeks.  Patient has a history of elevated blood pressure.  She takes metoprolol as below for hypertension.  ROS:  As above  Exam:  BP (!) 141/92   Pulse 86   Ht 5\' 6"  (1.676 m)   Wt 187 lb (84.8 kg)   BMI 30.18 kg/m  General: Well Developed, well nourished, and in no acute distress.  Neuro/Psych: Alert and oriented x3, extra-ocular muscles intact, able to move all 4 extremities, sensation grossly intact. Skin: Warm and dry, no rashes noted.  Respiratory: Not using accessory muscles, speaking in full sentences, trachea midline.  Cardiovascular: Pulses palpable, no extremity edema. Abdomen: Does not appear distended. MSK:  Right hip: Normal-appearing normal motion. Tender to palpation at the insertion of the gluteus medius and piriformis tendons onto the greater trochanter. Hip abduction strength is significantly diminished 3/5. Significant pain with resisted hip abduction strength testing. External rotational hip strength testing is also diminished and painful at 4/5.  Left hip normal-appearing nontender normal  motion.   Antalgic gait present.    Lab and Radiology Results EXAM: DG HIP (WITH OR WITHOUT PELVIS) 2-3V RIGHT  COMPARISON:  None.  FINDINGS: There is no evidence of hip fracture or dislocation. There is no evidence of arthropathy or other focal bone abnormality.  IMPRESSION: Normal right hip.   Electronically Signed   By: Marijo Conception, M.D.   On: 08/15/2018 14:31 I personally (independently) visualized and performed the interpretation of the images attached in this note.     Assessment and Plan: 42 y.o. female with right lateral hip pain.  Very likely hip abductor tendinopathy versus trochanteric bursitis.  Patient is failing typical conservative management including home exercise program and injection.  Plan to proceed with hip MRI to evaluate for potential tear and also to proceed with ultrasound-guided injection.  Additionally will simultaneously refer to physical therapy for formal physical therapy as I think this will be very helpful.  Recheck in the near future or sooner if needed. Prescribed meloxicam and Flexeril for symptomatic relief.  Hypertension: Blood pressure remains slightly elevated.  Recommend follow-up with primary care provider in the near future.  Continue metoprolol.   Orders Placed This Encounter  Procedures  . MR HIP RIGHT WO CONTRAST    Standing Status:   Future    Standing Expiration Date:   11/08/2019    Order Specific Question:   What is the patient's sedation requirement?    Answer:   No Sedation    Order Specific Question:   Does the patient have a pacemaker or implanted devices?    Answer:  No    Order Specific Question:   Preferred imaging location?    Answer:   Product/process development scientist (table limit-350lbs)    Order Specific Question:   Radiology Contrast Protocol - do NOT remove file path    Answer:   [email or web address]  . Ambulatory referral to Physical Therapy    Referral Priority:   Routine     Referral Type:   Physical Medicine    Referral Reason:   Specialty Services Required    Requested Specialty:   Physical Therapy   Meds ordered this encounter  Medications  . cyclobenzaprine (FLEXERIL) 10 MG tablet    Sig: Take 0.5-1 tablets (5-10 mg total) by mouth at bedtime.    Dispense:  90 tablet    Refill:  0  . meloxicam (MOBIC) 15 MG tablet    Sig: Take 1 tablet (15 mg total) by mouth daily as needed for pain.    Dispense:  30 tablet    Refill:  0    Historical information moved to improve visibility of documentation.  Past Medical History:  Diagnosis Date  . Acid reflux   . Depression   . GAD (generalized anxiety disorder) 02/10/2017  . Hypertension 02/10/2017  . Palpitations 02/10/2017   a. 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   . SVT (supraventricular tachycardia) ([place])   . Systolic murmur   . Tobacco use    Past Surgical History:  Procedure Laterality Date  . SACRO-ILIAC PINNING  2016   fusion, in [place] History   Tobacco Use  . Smoking status: Current Every Day Smoker    Packs/day: 0.50    Years: 22.00    Pack years: 11.00    Types: Cigarettes  . Smokeless tobacco: Never Used  Substance Use Topics  . Alcohol use: Yes   family history includes Arrhythmia in her sister; Cancer in her maternal grandfather and mother; Depression in her mother; Diabetes in her father and paternal grandfather; Hypertension in her mother.  Medications: Current Outpatient Medications  Medication Sig Dispense Refill  . albuterol (PROVENTIL HFA;VENTOLIN HFA) 108 (90 Base) MCG/ACT inhaler Inhale 2 puffs into the lungs every 6 (six) hours as needed for wheezing. 2 Inhaler 11  . B Complex-C (B-COMPLEX WITH VITAMIN C) tablet Take 1 tablet by mouth daily.    . cholecalciferol (VITAMIN D) 1000 units tablet Take 1,000 Units by mouth daily.    . diclofenac sodium (VOLTAREN) 1 % GEL Apply 2 g topically 4 (four) times daily. To affected joint. 100 g 11    . ipratropium (ATROVENT) 0.06 % nasal spray Place 2 sprays into both nostrils 4 (four) times daily. (Patient taking differently: Place 2 sprays into both nostrils as needed. ) 15 mL 12  . metoprolol succinate (TOPROL-XL) 50 MG 24 hr tablet Take 1 tablet (50 mg total) by mouth daily. Take with or immediately following a meal. 90 tablet 3  . Multiple Vitamin (MULTIVITAMIN WITH MINERALS) TABS tablet Take 1 tablet by mouth daily.    . Omega-3 Fatty Acids (FISH OIL PO) Take 1 capsule by mouth daily.    . predniSONE (STERAPRED UNI-PAK 48 TAB) 5 MG (48) TBPK tablet 12 day dosepack po 48 tablet 0  . sertraline (ZOLOFT) 50 MG tablet Take 1 tablet (50 mg total) by mouth daily. 30 tablet 3  . Vitamin D, Ergocalciferol, (DRISDOL) 50000 units CAPS capsule Take 1 capsule (50,000 Units total) by mouth every 7 (seven) days. For 12 weeks 12  capsule 0  . cyclobenzaprine (FLEXERIL) 10 MG tablet Take 0.5-1 tablets (5-10 mg total) by mouth at bedtime. 90 tablet 0  . meloxicam (MOBIC) 15 MG tablet Take 1 tablet (15 mg total) by mouth daily as needed for pain. 30 tablet 0   No current facility-administered medications for this visit.    Allergies  Allergen Reactions  . Fish Allergy Nausea And Vomiting    ALL SEAFOOD  . Shellfish Allergy Nausea And Vomiting    ALL SEAFOOD  . Sulfa Antibiotics Other (See Comments)    Thrush reaction Sores in mouth      Discussed warning signs or symptoms. Please see discharge instructions. Patient expresses understanding.

## 2018-09-10 ENCOUNTER — Encounter: Payer: Self-pay | Admitting: Family Medicine

## 2018-09-10 ENCOUNTER — Ambulatory Visit (INDEPENDENT_AMBULATORY_CARE_PROVIDER_SITE_OTHER): Payer: 59

## 2018-09-10 ENCOUNTER — Encounter: Payer: Self-pay | Admitting: Rheumatology

## 2018-09-10 DIAGNOSIS — M7061 Trochanteric bursitis, right hip: Secondary | ICD-10-CM

## 2018-09-10 DIAGNOSIS — M87851 Other osteonecrosis, right femur: Secondary | ICD-10-CM | POA: Diagnosis not present

## 2018-09-10 DIAGNOSIS — M8700 Idiopathic aseptic necrosis of unspecified bone: Secondary | ICD-10-CM | POA: Insufficient documentation

## 2018-09-10 DIAGNOSIS — R600 Localized edema: Secondary | ICD-10-CM | POA: Diagnosis not present

## 2018-09-10 DIAGNOSIS — M76891 Other specified enthesopathies of right lower limb, excluding foot: Secondary | ICD-10-CM

## 2018-09-11 ENCOUNTER — Encounter: Payer: Self-pay | Admitting: Family Medicine

## 2018-09-11 ENCOUNTER — Ambulatory Visit (INDEPENDENT_AMBULATORY_CARE_PROVIDER_SITE_OTHER): Payer: 59 | Admitting: Family Medicine

## 2018-09-11 VITALS — BP 139/81 | HR 86 | Ht 66.0 in | Wt 192.0 lb

## 2018-09-11 DIAGNOSIS — M25551 Pain in right hip: Secondary | ICD-10-CM | POA: Diagnosis not present

## 2018-09-11 DIAGNOSIS — M8700 Idiopathic aseptic necrosis of unspecified bone: Secondary | ICD-10-CM | POA: Diagnosis not present

## 2018-09-11 MED ORDER — ALENDRONATE SODIUM 70 MG PO TABS: 70.0000 mg | ORAL_TABLET | ORAL | 11 refills | Status: DC

## 2018-09-11 MED FILL — ALENDRONATE NA 70 MG TAB: 70 | 28 days supply | Qty: 4 | Fill #0

## 2018-09-11 NOTE — Progress Notes (Signed)
Deborah Goodman is a 42 y.o. female who presents to [place] today for hip MRI follow up.  She has been having pain in her right lateral hip for some time now.  The hip pain was thought to be due to trochanteric bursitis versus hip abductor tendinopathy.  She failed a trial of conservative management including home exercise program, and non-guided injection.   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 She has multiple exposures to oral and intraarticular steroids from orthopedic conditions including her right shoulder pain. She also has a history of alcohol use.  She notes persistent pain worse with ambulation better with rest.  She works as an Geologist, engineering in the hospital.  She has an upcoming surgery planned for her right shoulder in about 2 months.    ROS:  As above  Exam:  BP 139/81   Pulse 86   Ht 5\' 6"  (1.676 m)   Wt 192 lb (87.1 kg)   BMI 30.99 kg/m  General: Well Developed, well nourished, and in no acute distress.  Neuro/Psych: Alert and oriented x3, extra-ocular muscles intact, able to move all 4 extremities, sensation grossly intact. Skin: Warm and dry, no rashes noted.  Respiratory: Not using accessory muscles, speaking in full sentences, trachea midline.  Cardiovascular: Pulses palpable, no extremity edema. Abdomen: Does not appear distended. MSK: Right hip.  Normal motion mild antalgic gait present.    Lab and Radiology Results No results found for this or any previous visit (from the past 72 hour(s)). Mr Hip Right Wo Contrast  Result Date: 09/10/2018 CLINICAL DATA:  Right hip pain.  Worsening over the past year. EXAM: MR OF THE RIGHT HIP WITHOUT CONTRAST TECHNIQUE: Multiplanar, multisequence  MR imaging was performed. No intravenous contrast was administered. COMPARISON:  None. FINDINGS: Bones: No hip fracture or dislocation. Subchondral serpiginous linear signal abnormality in the superior right femoral head with severe surrounding marrow edema extending into the femoral neck most consistent with avascular necrosis without articular surface collapse. Subchondral serpiginous linear signal abnormality in the superior left femoral head with minimal adjacent marrow edema consistent with mild avascular necrosis without articular surface collapse. No periosteal reaction or bone destruction. No aggressive osseous lesion. Normal sacrum and sacroiliac joints. No SI joint widening or erosive changes. Prior arthrodesis of right sacroiliac joint with susceptibility artifact partially obscuring the adjacent soft tissue and osseous structures. Articular cartilage and labrum Articular cartilage:  No chondral defect. Labrum: Grossly intact, but evaluation is limited by lack of intraarticular fluid. Joint or bursal effusion Joint effusion: Small bilateral hip joint effusions, right greater than left. No SI joint effusion. Bursae:  No bursa formation. Muscles and tendons Flexors: Normal. Extensors: Normal. Abductors: Normal. Adductors: Normal. Gluteals: Normal. Hamstrings: Normal. Other findings Miscellaneous: No pelvic free fluid. No fluid collection or hematoma. No inguinal lymphadenopathy. No inguinal hernia. IMPRESSION: 1. Avascular necrosis of the right femoral head with severe surrounding marrow edema in the femoral head extending into the femoral neck. No articular surface collapse. 2. Mild avascular necrosis of the left femoral head with mild surrounding marrow edema. No articular surface collapse. Electronically Signed   By: Kathreen Devoid   On: 09/10/2018 09:26  I personally (independently) visualized and performed the interpretation of the images  attached in this note.      Assessment and Plan: 42 y.o.  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 Her MRI showed bilateral AVN but worse on the right side. This explains her pain however is very surprising as she did not have pain in her groin, all pain was on the lateral hip.  Fortunately she does not have femoral head collapse.  She is scheduled for a shoulder surgery for rotator cuff tendonopathy and I will contact her orthopedic surgeon regarding these results. The plan will be to start alendronate and reduce weight bearing as much as possible for 6 weeks. If this is not improving symptoms we can talk with her orthopedic surgeon about a possible core decompression.      No orders of the defined types were placed in this encounter.  Meds ordered this encounter  Medications  . alendronate (FOSAMAX) 70 MG tablet    Sig: Take 1 tablet (70 mg total) by mouth every 7 (seven) days.    Dispense:  4 tablet    Refill:  11    Historical information moved to improve visibility of documentation.  Past Medical History:  Diagnosis Date  . Acid reflux   . Depression   . GAD (generalized anxiety disorder) 02/10/2017  . Hypertension 02/10/2017  . Palpitations 02/10/2017   a. 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   . SVT (supraventricular tachycardia) ([place])   . Systolic murmur   . Tobacco use    Past Surgical History:  Procedure Laterality Date  . SACRO-ILIAC PINNING  2016   fusion, in [place] History   Tobacco Use  . Smoking status: Current Every Day Smoker    Packs/day: 0.50    Years: 22.00    Pack years: 11.00    Types: Cigarettes  . Smokeless tobacco: Never Used  Substance Use Topics  . Alcohol use: Yes   family history includes Arrhythmia in her sister; Cancer in her maternal grandfather and mother; Depression in her mother; Diabetes in her father and paternal grandfather; Hypertension in her mother.  Medications: Current Outpatient Medications  Medication Sig Dispense Refill  . albuterol  (PROVENTIL HFA;VENTOLIN HFA) 108 (90 Base) MCG/ACT inhaler Inhale 2 puffs into the lungs every 6 (six) hours as needed for wheezing. 2 Inhaler 11  . B Complex-C (B-COMPLEX WITH VITAMIN C) tablet Take 1 tablet by mouth daily.    . cholecalciferol (VITAMIN D) 1000 units tablet Take 1,000 Units by mouth daily.    . cyclobenzaprine (FLEXERIL) 10 MG tablet Take 0.5-1 tablets (5-10 mg total) by mouth at bedtime. 90 tablet 0  . diclofenac sodium (VOLTAREN) 1 % GEL Apply 2 g topically 4 (four) times daily. To affected joint. 100 g 11  . ipratropium (ATROVENT) 0.06 % nasal spray Place 2 sprays into both nostrils 4 (four) times daily. (Patient taking differently: Place 2 sprays into both nostrils as needed. ) 15 mL 12  . meloxicam (MOBIC) 15 MG tablet Take 1 tablet (15 mg total) by mouth daily as needed for pain. 30 tablet 0  . metoprolol succinate (TOPROL-XL) 50 MG 24 hr tablet Take 1 tablet (50 mg total) by mouth daily. Take with or immediately following a meal. 90 tablet 3  . Multiple Vitamin (MULTIVITAMIN WITH MINERALS) TABS tablet Take 1 tablet by mouth daily.    . Omega-3 Fatty Acids (FISH OIL PO) Take 1 capsule by mouth daily.    . sertraline (ZOLOFT) 50 MG tablet  Take 1 tablet (50 mg total) by mouth daily. 30 tablet 3  . Vitamin D, Ergocalciferol, (DRISDOL) 50000 units CAPS capsule Take 1 capsule (50,000 Units total) by mouth every 7 (seven) days. For 12 weeks 12 capsule 0  . alendronate (FOSAMAX) 70 MG tablet Take 1 tablet (70 mg total) by mouth every 7 (seven) days. 4 tablet 11   No current facility-administered medications for this visit.    Allergies  Allergen Reactions  . Fish Allergy Nausea And Vomiting    ALL SEAFOOD  . Shellfish Allergy Nausea And Vomiting    ALL SEAFOOD  . Sulfa Antibiotics Other (See Comments)    Thrush reaction Sores in mouth      Discussed warning signs or symptoms. Please see discharge instructions. Patient expresses understanding.  I personally was present  and performed or re-performed the history, physical exam and medical decision-making activities of this service and have verified that the service and findings are accurately documented in the student's note. ___________________________________________ Lynne Leader M.D., ABFM., CAQSM. Primary Care and Sports Medicine Adjunct Instructor of [employer]

## 2018-09-11 NOTE — Telephone Encounter (Signed)
I tried calling patient but could not reach her.  Please ask her way which she like a referral to we can refer her to [facility] for evaluation of positive ANA.

## 2018-09-11 NOTE — Patient Instructions (Addendum)
Thank you for coming in today. I expect that Dr Griffin Basil will want to see you again however I will send a letter first.  Try to keep the weight off the right hip.  Start Alendronate weekly.  Recheck with me in about 1 month if not seeing Dr Griffin Basil.   Avascular Necrosis Avascular necrosis is a disease resulting from the temporary or permanent loss of blood supply to a bone. This disease may also be known as:  Osteonecrosis.  Aseptic necrosis.  Ischemic bone necrosis.  Without proper blood supply, the internal layer of the affected bone dies and the outer layer of the bone may break down. If this process affects a bone near a joint, it may lead to collapse of that joint. Common bones that are affected by this condition include:  The top of your thigh bone (femoral head).  One or more bones in your wrist (scaphoid orlunate).  One or more bones in your foot (metatarsals).  One of the bones in your ankle (navicular).  The joint most commonly affected by this condition is the hip joint. Avascular necrosis rarely occurs in more than one bone at a time. What are the causes?  Damage or injury to a bone or joint.  Using corticosteroid medicine for a long period of time.  Changes in your immune or hormone systems.  Excessive exposure to radiation. What increases the risk?  Alcohol abuse.  Previous traumatic injury to a joint.  Using corticosteroid medicines for a long period of time or often.  Having a medical condition such as: ? HIV or AIDS. ? Diabetes. ? Sickle cell disease. ? An autoimmune disease. What are the signs or symptoms? The main symptoms of avascular necrosis are pain and decreased motion in the affected bone or joint. In the early stages the pain may be minor and occur only with activity. As avascular necrosis progresses, pain may gradually worsen and occur while at rest. The pain may suddenly become severe if an affected joint collapses. How is this  diagnosed? Avascular necrosis may be diagnosed with:  A medical history.  A physical exam.  X-rays.  An MRI.  A bone scan.  How is this treated? Treatments may include:  Medicine to help relieve pain.  Avoiding placing any pressure or weight ontheaffected area. If avascular necrosis occurs in your hip, ankle, or foot, you may be instructed to use crutches or a rolling scooter.  Surgery, such as: ? Core decompression. In this surgery, one or more holes are placed in the bone for new blood vessels to grow into. This provides a renewed blood supply to the bone. Core decompression can often reduce pain and pressure in the affected bone and slow the progression of bone and joint destruction. ? Osteotomy. In this surgery, the bone is reshaped to reduce stress on the affected area of the joint. ? Bone grafting. In this surgery, healthy bone from one part of your body is transplanted to the affected area. ? Arthroplasty. Arthroplasty is also known as total joint replacement. In this surgery, the affected surface on one or both sides of a joint is replaced with artificial parts (prostheses).  Electrical stimulation. This may help encourage new bone growth.  Follow these instructions at home:  Take medicines only as directed by your health care provider.  Follow your health care provider's recommendations on limiting activities or using crutches to rest your affected joint.  Meet with aphysical therapist as directed by your health care provider.  Keep  all follow-up visits as directed by your health care provider. This is important. Contact a health care provider if:  Your pain worsens.  You have decreased motion in your affected joint. Get help right away if: Your pain suddenly becomes severe. This information is not intended to replace advice given to you by your health care provider. Make sure you discuss any questions you have with your health care provider. Document  Released: 05/27/2002 Document Revised: 05/12/2016 Document Reviewed: 02/12/2014 Elsevier Interactive Patient Education  [employer].

## 2018-09-11 NOTE — Telephone Encounter (Signed)
Pt made appt

## 2018-09-12 ENCOUNTER — Telehealth: Payer: Self-pay | Admitting: Family Medicine

## 2018-09-12 ENCOUNTER — Ambulatory Visit: Payer: 59 | Admitting: Physical Therapy

## 2018-09-12 NOTE — Telephone Encounter (Signed)
Left VM with recommendation and contact info.

## 2018-09-12 NOTE — Telephone Encounter (Signed)
I spoke with Dr Griffin Basil. He would like you to schedule an appointment with him Thursday or Friday.  Number to [facility] is [phone]

## 2018-09-18 ENCOUNTER — Encounter: Payer: Self-pay | Admitting: Family Medicine

## 2018-09-21 DIAGNOSIS — M25511 Pain in right shoulder: Secondary | ICD-10-CM | POA: Diagnosis not present

## 2018-09-24 ENCOUNTER — Encounter: Payer: Self-pay | Admitting: Family Medicine

## 2018-09-24 DIAGNOSIS — E559 Vitamin D deficiency, unspecified: Secondary | ICD-10-CM

## 2018-09-24 MED FILL — SERTRALINE HCL 50 MG TABLET: 50 | 30 days supply | Qty: 30 | Fill #1 | Status: TO

## 2018-09-26 ENCOUNTER — Telehealth: Payer: Self-pay

## 2018-09-26 ENCOUNTER — Encounter: Payer: Self-pay | Admitting: Family Medicine

## 2018-09-26 ENCOUNTER — Encounter: Payer: Self-pay | Admitting: Sports Medicine

## 2018-09-26 NOTE — Telephone Encounter (Signed)
Received faxed med refills request for metoprolol, sertraline and fosamax from [facility]. Tried calling pt to confirm whether she wants current refills transferred to rx mail order. No answer, phone keeps ringing, no option for vm.

## 2018-10-01 ENCOUNTER — Encounter: Payer: Self-pay | Admitting: Family Medicine

## 2018-10-02 ENCOUNTER — Encounter: Payer: Self-pay | Admitting: Family Medicine

## 2018-10-02 NOTE — Telephone Encounter (Signed)
Left a second vm msg for pt to return call back to confirm current pharmacy. Direct call back info provided.

## 2018-10-08 MED FILL — METOPROLOL SUCCINATE ER 50: 50 | 90 days supply | Qty: 90 | Fill #1

## 2018-10-08 MED FILL — ALENDRONATE NA 70 MG TAB: 70 | 28 days supply | Qty: 4 | Fill #0

## 2018-10-10 ENCOUNTER — Encounter: Payer: Self-pay | Admitting: Family Medicine

## 2018-10-12 NOTE — Telephone Encounter (Signed)
Attempted to contact pt, no answer. Left a third msg. Direct call back info provided.

## 2018-10-23 ENCOUNTER — Encounter: Payer: Self-pay | Admitting: Family Medicine

## 2018-10-24 ENCOUNTER — Encounter: Payer: Self-pay | Admitting: Family Medicine

## 2018-10-30 DIAGNOSIS — M25551 Pain in right hip: Secondary | ICD-10-CM | POA: Diagnosis not present

## 2018-10-31 ENCOUNTER — Encounter: Payer: Self-pay | Admitting: Family Medicine

## 2018-10-31 MED ORDER — VITAMIN D (ERGOCALCIFEROL) 1.25 MG (50000 UNIT) PO CAPS: 50000.0000 [IU] | ORAL_CAPSULE | ORAL | 0 refills | Status: DC

## 2018-11-01 MED FILL — SERTRALINE HCL 50 MG TABLET: 50 | 30 days supply | Qty: 30 | Fill #0

## 2018-11-01 MED FILL — VIT D2 1.25 MG (50,000 UNIT: 1.25 MG | 84 days supply | Qty: 12 | Fill #0

## 2018-11-05 ENCOUNTER — Encounter: Payer: Self-pay | Admitting: Family Medicine

## 2018-11-05 DIAGNOSIS — M79651 Pain in right thigh: Secondary | ICD-10-CM

## 2018-11-09 ENCOUNTER — Ambulatory Visit (HOSPITAL_COMMUNITY)
Admission: RE | Admit: 2018-11-09 | Discharge: 2018-11-09 | Disposition: A | Discharge status: Home / Self Care | Payer: 59 | Source: Ambulatory Visit | Attending: Family Medicine | Admitting: Family Medicine

## 2018-11-09 DIAGNOSIS — M79651 Pain in right thigh: Secondary | ICD-10-CM | POA: Diagnosis not present

## 2018-11-11 ENCOUNTER — Encounter: Payer: Self-pay | Admitting: Family Medicine

## 2018-11-19 MED FILL — ALENDRONATE NA 70 MG TAB: 70 | 28 days supply | Qty: 4 | Fill #1

## 2018-11-20 ENCOUNTER — Other Ambulatory Visit: Payer: Self-pay | Admitting: Orthopedic Surgery

## 2018-11-22 ENCOUNTER — Ambulatory Visit (HOSPITAL_BASED_OUTPATIENT_CLINIC_OR_DEPARTMENT_OTHER): Admit: 2018-11-22 | Payer: 59 | Admitting: Orthopaedic Surgery

## 2018-11-22 ENCOUNTER — Encounter (HOSPITAL_BASED_OUTPATIENT_CLINIC_OR_DEPARTMENT_OTHER): Payer: Self-pay

## 2018-11-22 SURGERY — SHOULDER ARTHROSCOPY WITH SUBACROMIAL DECOMPRESSION, ROTATOR CUFF REPAIR AND BICEP TENDON REPAIR
Anesthesia: Choice | Site: Shoulder | Laterality: Right

## 2018-11-26 ENCOUNTER — Encounter (HOSPITAL_COMMUNITY): Payer: Self-pay

## 2018-11-26 NOTE — Patient Instructions (Addendum)
Your procedure is scheduled on: Wednesday, Dec. 18, 2019   Surgery Time:  1:15PM-3:10PM   Report to [facility]    Report to admitting at 10:45AM   Call this number if you have problems the morning of surgery [phone]   Do not eat food:After Midnight.   May have liquids until 7:00AM day of surgery  CLEAR LIQUID DIET  Foods Allowed                                                                     Foods Excluded  Coffee and tea, regular and decaf                             liquids that you cannot  Plain Jell-O in any flavor                                             see through such as: Fruit ices (not with fruit pulp)                                     milk, soups, orange juice  Iced Popsicles                                    All solid food Carbonated beverages, regular and diet                                    Cranberry, grape and apple juices Sports drinks like Gatorade Lightly seasoned clear broth or consume(fat free) Sugar, honey syrup  Sample Menu Breakfast                                Lunch                                     Supper Cranberry juice                    Beef broth                            Chicken broth Jell-O                                     Grape juice                           Apple juice Coffee or tea  Jell-O                                      Popsicle                                                Coffee or tea                        Coffee or tea    Brush your teeth the morning of surgery.   Do NOT smoke after Midnight   Take these medicines the morning of surgery with A SIP OF WATER: None   Bring Asthma Inhaler day of surgery                               You may not have any metal on your body including hair pins, jewelry, and body piercings             Do not wear make-up, lotions, powders, perfumes/cologne, or deodorant             Do not wear nail polish.  Do not shave   48 hours prior to surgery.               Do not bring valuables to the hospital. [place].   Contacts, dentures or bridgework may not be worn into surgery.   Leave suitcase in the car. After surgery it may be brought to your room.    Special Instructions: Bring a copy of your healthcare power of attorney and living will documents         the day of surgery if you haven't scanned them in before.              Please read over the following fact sheets you were given:  [facility] - Preparing for Surgery Before surgery, you can play an important role.  Because skin is not sterile, your skin needs to be as free of germs as possible.  You can reduce the number of germs on your skin by washing with CHG (chlorahexidine gluconate) soap before surgery.  CHG is an antiseptic cleaner which kills germs and bonds with the skin to continue killing germs even after washing. Please DO NOT use if you have an allergy to CHG or antibacterial soaps.  If your skin becomes reddened/irritated stop using the CHG and inform your nurse when you arrive at Short Stay. Do not shave (including legs and underarms) for at least 48 hours prior to the first CHG shower.  You may shave your face/neck.  Please follow these instructions carefully:  1.  Shower with CHG Soap the night before surgery and the  morning of surgery.  2.  If you choose to wash your hair, wash your hair first as usual with your normal  shampoo.  3.  After you shampoo, rinse your hair and body thoroughly to remove the shampoo.                             4.  Use CHG as you would any other liquid soap.  You can apply chg directly to the skin and wash.  Gently with a scrungie or clean washcloth.  5.  Apply the CHG Soap to your body ONLY FROM THE NECK DOWN.   Do   not use on face/ open                           Wound or open sores. Avoid contact with eyes, ears mouth and   genitals (private parts).                        Wash face,  Genitals (private parts) with your normal soap.             6.  Wash thoroughly, paying special attention to the area where your    surgery  will be performed.  7.  Thoroughly rinse your body with warm water from the neck down.  8.  DO NOT shower/wash with your normal soap after using and rinsing off the CHG Soap.                9.  Pat yourself dry with a clean towel.            10.  Wear clean pajamas.            11.  Place clean sheets on your bed the night of your first shower and do not  sleep with pets. Day of Surgery : Do not apply any lotions/deodorants the morning of surgery.  Please wear clean clothes to the hospital/surgery center.  FAILURE TO FOLLOW THESE INSTRUCTIONS MAY RESULT IN THE CANCELLATION OF YOUR SURGERY  PATIENT SIGNATURE_________________________________  NURSE SIGNATURE__________________________________  ________________________________________________________________________   Adam Phenix  An incentive spirometer is a tool that can help keep your lungs clear and active. This tool measures how well you are filling your lungs with each breath. Taking long deep breaths may help reverse or decrease the chance of developing breathing (pulmonary) problems (especially infection) following:  A long period of time when you are unable to move or be active. BEFORE THE PROCEDURE   If the spirometer includes an indicator to show your best effort, your nurse or respiratory therapist will set it to a desired goal.  If possible, sit up straight or lean slightly forward. Try not to slouch.  Hold the incentive spirometer in an upright position. INSTRUCTIONS FOR USE  1. Sit on the edge of your bed if possible, or sit up as far as you can in bed or on a chair. 2. Hold the incentive spirometer in an upright position. 3. Breathe out normally. 4. Place the mouthpiece in your mouth and seal your lips tightly around it. 5. Breathe in slowly and as  deeply as possible, raising the piston or the ball toward the top of the column. 6. Hold your breath for 3-5 seconds or for as long as possible. Allow the piston or ball to fall to the bottom of the column. 7. Remove the mouthpiece from your mouth and breathe out normally. 8. Rest for a few seconds and repeat Steps 1 through 7 at least 10 times every 1-2 hours when you are awake. Take your time and take a few normal breaths between deep breaths. 9. The spirometer may include an indicator to show your best effort. Use the indicator as a goal to work toward during each repetition. 10.  After each set of 10 deep breaths, practice coughing to be sure your lungs are clear. If you have an incision (the cut made at the time of surgery), support your incision when coughing by placing a pillow or rolled up towels firmly against it. Once you are able to get out of bed, walk around indoors and cough well. You may stop using the incentive spirometer when instructed by your caregiver.  RISKS AND COMPLICATIONS  Take your time so you do not get dizzy or light-headed.  If you are in pain, you may need to take or ask for pain medication before doing incentive spirometry. It is harder to take a deep breath if you are having pain. AFTER USE  Rest and breathe slowly and easily.  It can be helpful to keep track of a log of your progress. Your caregiver can provide you with a simple table to help with this. If you are using the spirometer at home, follow these instructions: [place] IF:   You are having difficultly using the spirometer.  You have trouble using the spirometer as often as instructed.  Your pain medication is not giving enough relief while using the spirometer.  You develop fever of 100.5 F (38.1 C) or higher. SEEK IMMEDIATE MEDICAL CARE IF:   You cough up bloody sputum that had not been present before.  You develop fever of 102 F (38.9 C) or greater.  You develop worsening pain  at or near the incision site. MAKE SURE YOU:   Understand these instructions.  Will watch your condition.  Will get help right away if you are not doing well or get worse. Document Released: 04/17/2007 Document Revised: 02/27/2012 Document Reviewed: 06/18/2007 ExitCare Patient Information 2014 ExitCare, [place].   ________________________________________________________________________  WHAT IS A BLOOD TRANSFUSION? Blood Transfusion Information  A transfusion is the replacement of blood or some of its parts. Blood is made up of multiple cells which provide different functions.  Red blood cells carry oxygen and are used for blood loss replacement.  White blood cells fight against infection.  Platelets control bleeding.  Plasma helps clot blood.  Other blood products are available for specialized needs, such as hemophilia or other clotting disorders. BEFORE THE TRANSFUSION  Who gives blood for transfusions?   Healthy volunteers who are fully evaluated to make sure their blood is safe. This is blood bank blood. Transfusion therapy is the safest it has ever been in the practice of medicine. Before blood is taken from a donor, a complete history is taken to make sure that person has no history of diseases nor engages in risky social behavior (examples are intravenous drug use or sexual activity with multiple partners). The donor's travel history is screened to minimize risk of transmitting infections, such as malaria. The donated blood is tested for signs of infectious diseases, such as HIV and hepatitis. The blood is then tested to be sure it is compatible with you in order to minimize the chance of a transfusion reaction. If you or a relative donates blood, this is often done in anticipation of surgery and is not appropriate for emergency situations. It takes many days to process the donated blood. RISKS AND COMPLICATIONS Although transfusion therapy is very safe and saves many lives, the  main dangers of transfusion include:   Getting an infectious disease.  Developing a transfusion reaction. This is an allergic reaction to something in the blood you were given. Every precaution is taken to prevent this. The decision  to have a blood transfusion has been considered carefully by your caregiver before blood is given. Blood is not given unless the benefits outweigh the risks. AFTER THE TRANSFUSION  Right after receiving a blood transfusion, you will usually feel much better and more energetic. This is especially true if your red blood cells have gotten low (anemic). The transfusion raises the level of the red blood cells which carry oxygen, and this usually causes an energy increase.  The nurse administering the transfusion will monitor you carefully for complications. HOME CARE INSTRUCTIONS  No special instructions are needed after a transfusion. You may find your energy is better. Speak with your caregiver about any limitations on activity for underlying diseases you may have. SEEK MEDICAL CARE IF:   Your condition is not improving after your transfusion.  You develop redness or irritation at the intravenous (IV) site. SEEK IMMEDIATE MEDICAL CARE IF:  Any of the following symptoms occur over the next 12 hours:  Shaking chills.  You have a temperature by mouth above 102 F (38.9 C), not controlled by medicine.  Chest, back, or muscle pain.  People around you feel you are not acting correctly or are confused.  Shortness of breath or difficulty breathing.  Dizziness and fainting.  You get a rash or develop hives.  You have a decrease in urine output.  Your urine turns a dark color or changes to pink, red, or brown. Any of the following symptoms occur over the next 10 days:  You have a temperature by mouth above 102 F (38.9 C), not controlled by medicine.  Shortness of breath.  Weakness after normal activity.  The white part of the eye turns yellow  (jaundice).  You have a decrease in the amount of urine or are urinating less often.  Your urine turns a dark color or changes to pink, red, or brown. Document Released: 12/02/2000 Document Revised: 02/27/2012 Document Reviewed: 07/21/2008 [facility] Patient Information 2014 [place], [place].  _______________________________________________________________________

## 2018-11-26 NOTE — Pre-Procedure Instructions (Signed)
The following are in epic: EKG 04/19/2018 ECHO 03/07/2018 CXR 05/15/2018

## 2018-11-29 ENCOUNTER — Encounter: Payer: Self-pay | Admitting: Family Medicine

## 2018-11-29 DIAGNOSIS — F411 Generalized anxiety disorder: Secondary | ICD-10-CM

## 2018-11-29 DIAGNOSIS — I10 Essential (primary) hypertension: Secondary | ICD-10-CM

## 2018-11-30 ENCOUNTER — Other Ambulatory Visit: Payer: Self-pay

## 2018-11-30 ENCOUNTER — Encounter (HOSPITAL_COMMUNITY)
Admission: RE | Admit: 2018-11-30 | Discharge: 2018-11-30 | Disposition: A | Discharge status: Home / Self Care | Payer: 59 | Source: Ambulatory Visit | Attending: Orthopedic Surgery | Admitting: Orthopedic Surgery

## 2018-11-30 ENCOUNTER — Encounter (HOSPITAL_COMMUNITY): Payer: Self-pay

## 2018-11-30 DIAGNOSIS — Z01818 Encounter for other preprocedural examination: Secondary | ICD-10-CM

## 2018-11-30 DIAGNOSIS — Z01812 Encounter for preprocedural laboratory examination: Secondary | ICD-10-CM | POA: Insufficient documentation

## 2018-11-30 HISTORY — DX: Fibromyalgia: M79.7

## 2018-11-30 HISTORY — DX: Raynaud's syndrome without gangrene: I73.00

## 2018-11-30 HISTORY — DX: Other complications of anesthesia, initial encounter: T88.59XA

## 2018-11-30 HISTORY — DX: Solitary pulmonary nodule: R91.1

## 2018-11-30 HISTORY — DX: Idiopathic aseptic necrosis of unspecified bone: M87.00

## 2018-11-30 HISTORY — DX: Unspecified disorder of synovium and tendon, unspecified shoulder: M67.919

## 2018-11-30 HISTORY — DX: Fatty (change of) liver, not elsewhere classified: K76.0

## 2018-11-30 HISTORY — DX: Pneumonia, unspecified organism: J18.9

## 2018-11-30 HISTORY — DX: Adverse effect of unspecified anesthetic, initial encounter: T41.45XA

## 2018-11-30 HISTORY — DX: Splenomegaly, not elsewhere classified: R16.1

## 2018-11-30 LAB — URINALYSIS, COMPLETE (UACMP) WITH MICROSCOPIC
Bilirubin Urine: NEGATIVE
Glucose, UA: NEGATIVE mg/dL
Hgb urine dipstick: NEGATIVE
Ketones, ur: NEGATIVE mg/dL
Nitrite: NEGATIVE
Protein, ur: NEGATIVE mg/dL
Specific Gravity, Urine: 1.024 (ref 1.005–1.030)
pH: 5 (ref 5.0–8.0)

## 2018-11-30 LAB — CBC WITH DIFFERENTIAL/PLATELET
ABS IMMATURE GRANULOCYTES: 0.04 10*3/uL (ref 0.00–0.07)
BASOS PCT: 1 %
Basophils Absolute: 0.1 10*3/uL (ref 0.0–0.1)
Eosinophils Absolute: 0.2 10*3/uL (ref 0.0–0.5)
Eosinophils Relative: 2 %
HCT: 41.1 % (ref 36.0–46.0)
Hemoglobin: 13.7 g/dL (ref 12.0–15.0)
IMMATURE GRANULOCYTES: 1 %
Lymphocytes Relative: 32 %
Lymphs Abs: 2.8 10*3/uL (ref 0.7–4.0)
MCH: 30 pg (ref 26.0–34.0)
MCHC: 33.3 g/dL (ref 30.0–36.0)
MCV: 89.9 fL (ref 80.0–100.0)
MONOS PCT: 8 %
Monocytes Absolute: 0.7 10*3/uL (ref 0.1–1.0)
NEUTROS ABS: 5 10*3/uL (ref 1.7–7.7)
NEUTROS PCT: 56 %
PLATELETS: 278 10*3/uL (ref 150–400)
RBC: 4.57 MIL/uL (ref 3.87–5.11)
RDW: 12.3 % (ref 11.5–15.5)
WBC: 8.8 10*3/uL (ref 4.0–10.5)
nRBC: 0 % (ref 0.0–0.2)

## 2018-11-30 LAB — BASIC METABOLIC PANEL
Anion gap: 9 (ref 5–15)
BUN: 12 mg/dL (ref 6–20)
CHLORIDE: 102 mmol/L (ref 98–111)
CO2: 24 mmol/L (ref 22–32)
Calcium: 9.3 mg/dL (ref 8.9–10.3)
Creatinine, Ser: 0.65 mg/dL (ref 0.44–1.00)
GFR calc Af Amer: >60 mL/min (ref >60)
GFR calc non Af Amer: >60 mL/min (ref >60)
GLUCOSE: 98 mg/dL (ref 70–99)
POTASSIUM: 3.8 mmol/L (ref 3.5–5.1)
Sodium: 135 mmol/L (ref 135–145)

## 2018-11-30 LAB — SURGICAL PCR SCREEN
MRSA, PCR: NEGATIVE
STAPHYLOCOCCUS AUREUS: POSITIVE — AB

## 2018-11-30 LAB — ABO/RH: ABO/RH(D): AB POS

## 2018-11-30 LAB — PROTIME-INR
INR: 0.95
Prothrombin Time: 12.6 seconds (ref 11.4–15.2)

## 2018-11-30 LAB — APTT: aPTT: 28 seconds (ref 24–36)

## 2018-11-30 MED ORDER — SERTRALINE HCL 50 MG PO TABS: 50.0000 mg | ORAL_TABLET | Freq: Every day | ORAL | 1 refills | Status: DC

## 2018-11-30 MED ORDER — ALENDRONATE SODIUM 70 MG PO TABS: 70.0000 mg | ORAL_TABLET | ORAL | 3 refills | Status: DC

## 2018-11-30 MED ORDER — METOPROLOL SUCCINATE ER 50 MG PO TB24: 50.0000 mg | ORAL_TABLET | Freq: Every day | ORAL | 3 refills | Status: DC

## 2018-11-30 NOTE — Pre-Procedure Instructions (Signed)
UA and PCR results 11/30/2018 sent to Dr. Mayer Camel via epic.

## 2018-12-04 MED ORDER — BUPIVACAINE LIPOSOME 1.3 % IJ SUSP
10.0000 mL | INTRAMUSCULAR | Status: DC
  Filled 2018-12-04 (×2): qty 10

## 2018-12-04 MED ORDER — TRANEXAMIC ACID 1000 MG/10ML IV SOLN
2000.0000 mg | INTRAVENOUS | Status: DC
  Filled 2018-12-04: qty 20

## 2018-12-04 NOTE — H&P (Signed)
TOTAL HIP ADMISSION H&P  Patient is admitted for right total hip arthroplasty.  Subjective:  Chief Complaint: right hip pain  HPI: Deborah Goodman, 42 y.o. 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  Onset of symptoms was gradual starting several years ago with gradually worsening course since that time.The patient noted no past surgery on the right hip(s).  Patient currently rates pain in the right hip at 10 out of 10 with activity. Patient has night pain, worsening of pain with activity and weight bearing, trendelenberg gait, pain that interfers with activities of daily living and pain with passive range of motion. Patient has evidence of joint space narrowing and AVN with subchondral collapse by imaging studies. This condition presents safety issues increasing the risk of falls.  There is no current active infection.  Patient Active Problem List   Diagnosis Date Noted  . Avascular necrosis ([place]) 09/10/2018  . Acute right hip pain 08/15/2018  . 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 Chronic right shoulder pain 07/23/2018  . Morbid obesity ([place]) 12/25/2017  . Family history of autoimmune disorder 12/18/2017  . Transaminitis 05/05/2017  . Lung nodule < 6cm on CT 05/05/2017  . Fever 04/27/2017  . PSVT (paroxysmal supraventricular tachycardia) ([place]) 03/13/2017  . Tobacco use 03/13/2017  . Palpitations 02/10/2017  . GAD (generalized anxiety disorder) 02/10/2017  . Hypertension 02/10/2017  . Abnormal thyroid blood test 02/10/2017   Past Medical History:  Diagnosis Date  . Acid reflux   . Avascular necrosis ([place]) 08/2018   Right hip  . Complication of anesthesia    needs glide scope because trachea sit to  the side not in the middle after SI surgery did not have a voice  . Depression   . Fatty liver 04/2017   noted on CT Chest  . Fibromyalgia   . GAD (generalized anxiety disorder) 02/10/2017  . Hypertension 02/10/2017  . Lung nodule 04/2017   Stable 4 mm nodule in the periphery of the lateral segment , right, noted on CT Chest  . Palpitations 02/10/2017   a. 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   . Pneumonia 01/2018  . Raynaud's syndrome   . Splenomegaly 04/2017   noted on CT Chest  . SVT (supraventricular tachycardia) ([place])   . Systolic murmur   . Tendinopathy of rotator cuff    Right  . Tobacco use     Past Surgical History:  Procedure Laterality Date  . SACRO-ILIAC PINNING  2016   fusion, in [place]  . TONSILLECTOMY     age 53    Current Facility-Administered Medications  Medication Dose Route Frequency Provider Last Rate Last Dose  . [START ON 12/05/2018] bupivacaine liposome (EXPAREL) 1.3 % injection 133 mg  10 mL Infiltration On Call to OR Polly Cobia, RPH      . [START ON 12/05/2018] tranexamic acid (CYKLOKAPRON) 2,000 mg in sodium chloride 0.9 % 50 mL Topical Application  6,213 mg Topical On Call to OR Polly Cobia, [facility]       Current Outpatient Medications  Medication Sig Dispense Refill Last Dose  . albuterol (PROVENTIL HFA;VENTOLIN HFA) 108 (90 Base) MCG/ACT inhaler Inhale 2 puffs into the lungs every 6 (six) hours as needed for wheezing. 2  Inhaler 11 Taking  . cyclobenzaprine (FLEXERIL) 10 MG tablet Take 0.5-1 tablets (5-10 mg total) by mouth at bedtime. (Patient taking differently: Take 5-10 mg by mouth at bedtime as needed for muscle spasms. ) 90 tablet 0 Taking  . diclofenac sodium (VOLTAREN) 1 % GEL Apply 2 g topically 4 (four) times daily. To affected joint. (Patient taking differently: Apply 2 g topically 2 (two) times daily as needed (pain). To affected joint.) 100 g 11 Taking  . Vitamin D, Ergocalciferol, (DRISDOL) 1.25 MG (50000  UT) CAPS capsule Take 1 capsule (50,000 Units total) by mouth every 7 (seven) days. For 12 weeks 12 capsule 0   . alendronate (FOSAMAX) 70 MG tablet Take 1 tablet (70 mg total) by mouth every 7 (seven) days. 12 tablet 3   . metoprolol succinate (TOPROL-XL) 50 MG 24 hr tablet Take 1 tablet (50 mg total) by mouth daily. Take with or immediately following a meal. 90 tablet 3   . sertraline (ZOLOFT) 50 MG tablet Take 1 tablet (50 mg total) by mouth daily. 90 tablet 1    Allergies  Allergen Reactions  . Fish Allergy Nausea And Vomiting    ALL SEAFOOD  . Shellfish Allergy Nausea And Vomiting    ALL SEAFOOD  . Sulfa Antibiotics Other (See Comments)    Thrush reaction Sores in mouth    Social History   Tobacco Use  . Smoking status: Current Every Day Smoker    Packs/day: 0.50    Years: 22.00    Pack years: 11.00    Types: Cigarettes, E-cigarettes  . Smokeless tobacco: Never Used  Substance Use Topics  . Alcohol use: Yes    Family History  Problem Relation Age of Onset  . Depression Mother   . Hypertension Mother   . Cancer Mother        colon  . Diabetes Father   . Arrhythmia Sister        palpitations  . Cancer Maternal Grandfather        COLON  . Diabetes Paternal Grandfather      Review of Systems  Constitutional: Negative.   HENT: Negative.   Eyes: Negative.   Respiratory: Negative.   Cardiovascular:       Heart murmur and HTN  Gastrointestinal: Positive for heartburn.  Genitourinary: Negative.   Musculoskeletal: Positive for joint pain and myalgias.  Skin: Negative.   Endo/Heme/Allergies: Positive for polydipsia. Bruises/bleeds easily.  Psychiatric/Behavioral: Positive for depression. The patient is nervous/anxious.     Objective:  Physical Exam  Constitutional: She is oriented to person, place, and time. She appears well-developed and well-nourished.  HENT:  Head: Normocephalic and atraumatic.  Eyes: Pupils are equal, round, and reactive to light.  Neck:  Normal range of motion. Neck supple.  Cardiovascular: Intact distal pulses.  Respiratory: Effort normal.  Musculoskeletal:     Comments: Patient is examined sitting position has an irritable hip to internal rotation foot tap is negative she walks with a Trendelenburg and antalgic gait on the right side.  Normal pulses of the foot normal sensation of the foot neurovascular intact distally.  Neurological: She is alert and oriented to person, place, and time.  Skin: Skin is warm and dry.  Psychiatric: She has a normal mood and affect. Her behavior is normal. Judgment and thought content normal.    Vital signs in last 24 hours:    Labs:   Estimated body mass index is 31.15 kg/m as calculated from the following:   Height as  of 11/30/18: 5\' 6"  (1.676 m).   Weight as of 11/30/18: 87.5 kg.   Imaging Review Plain radiographs demonstrate an MRI which show  AVN     Preoperative templating of the joint replacement has been completed, documented, and submitted to the Operating Room personnel in order to optimize intra-operative equipment management.     Assessment/Plan:  End stage arthritis, right hip(s)  The patient history, physical examination, clinical judgement of the provider and imaging studies are consistent with end stage degenerative joint disease of the right hip(s) and total hip arthroplasty is deemed medically necessary. The treatment options including medical management, injection therapy, arthroscopy and arthroplasty were discussed at length. The risks and benefits of total hip arthroplasty were presented and reviewed. The risks due to aseptic loosening, infection, stiffness, dislocation/subluxation,  thromboembolic complications and other imponderables were discussed.  The patient acknowledged the explanation, agreed to proceed with the plan and consent was signed. Patient is being admitted for inpatient treatment for surgery, pain control, PT, OT, prophylactic antibiotics, VTE  prophylaxis, progressive ambulation and ADL's and discharge planning.The patient is planning to be discharged home with home health services

## 2018-12-05 ENCOUNTER — Encounter (HOSPITAL_COMMUNITY)
Admission: RE | Disposition: A | Discharge status: Home / Self Care | Payer: Self-pay | Source: Home / Self Care | Attending: Orthopedic Surgery

## 2018-12-05 ENCOUNTER — Encounter (HOSPITAL_COMMUNITY): Payer: Self-pay

## 2018-12-05 ENCOUNTER — Inpatient Hospital Stay (HOSPITAL_COMMUNITY): Payer: 59

## 2018-12-05 ENCOUNTER — Inpatient Hospital Stay (HOSPITAL_COMMUNITY): Payer: 59 | Admitting: Anesthesiology

## 2018-12-05 ENCOUNTER — Inpatient Hospital Stay (HOSPITAL_COMMUNITY)
Admission: RE | Admit: 2018-12-05 | Discharge: 2018-12-07 | DRG: 470 | Disposition: A | Discharge status: Home / Self Care | Payer: 59 | Attending: Orthopedic Surgery | Admitting: Orthopedic Surgery

## 2018-12-05 ENCOUNTER — Other Ambulatory Visit: Payer: Self-pay

## 2018-12-05 DIAGNOSIS — R911 Solitary pulmonary nodule: Secondary | ICD-10-CM | POA: Diagnosis present

## 2018-12-05 DIAGNOSIS — I7300 Raynaud's syndrome without gangrene: Secondary | ICD-10-CM | POA: Diagnosis present

## 2018-12-05 DIAGNOSIS — Z91013 Allergy to seafood: Secondary | ICD-10-CM | POA: Diagnosis not present

## 2018-12-05 DIAGNOSIS — M1611 Unilateral primary osteoarthritis, right hip: Secondary | ICD-10-CM | POA: Diagnosis present

## 2018-12-05 DIAGNOSIS — Z7951 Long term (current) use of inhaled steroids: Secondary | ICD-10-CM | POA: Diagnosis not present

## 2018-12-05 DIAGNOSIS — Z7983 Long term (current) use of bisphosphonates: Secondary | ICD-10-CM

## 2018-12-05 DIAGNOSIS — D62 Acute posthemorrhagic anemia: Secondary | ICD-10-CM | POA: Diagnosis not present

## 2018-12-05 DIAGNOSIS — Z79899 Other long term (current) drug therapy: Secondary | ICD-10-CM

## 2018-12-05 DIAGNOSIS — Z882 Allergy status to sulfonamides status: Secondary | ICD-10-CM

## 2018-12-05 DIAGNOSIS — M25559 Pain in unspecified hip: Secondary | ICD-10-CM

## 2018-12-05 DIAGNOSIS — Z981 Arthrodesis status: Secondary | ICD-10-CM | POA: Diagnosis not present

## 2018-12-05 DIAGNOSIS — R269 Unspecified abnormalities of gait and mobility: Secondary | ICD-10-CM | POA: Diagnosis not present

## 2018-12-05 DIAGNOSIS — M797 Fibromyalgia: Secondary | ICD-10-CM | POA: Diagnosis present

## 2018-12-05 DIAGNOSIS — I10 Essential (primary) hypertension: Secondary | ICD-10-CM | POA: Diagnosis present

## 2018-12-05 DIAGNOSIS — F17210 Nicotine dependence, cigarettes, uncomplicated: Secondary | ICD-10-CM | POA: Diagnosis present

## 2018-12-05 DIAGNOSIS — Z96641 Presence of right artificial hip joint: Secondary | ICD-10-CM

## 2018-12-05 DIAGNOSIS — M87851 Other osteonecrosis, right femur: Principal | ICD-10-CM | POA: Diagnosis present

## 2018-12-05 DIAGNOSIS — F418 Other specified anxiety disorders: Secondary | ICD-10-CM | POA: Diagnosis not present

## 2018-12-05 DIAGNOSIS — K219 Gastro-esophageal reflux disease without esophagitis: Secondary | ICD-10-CM | POA: Diagnosis not present

## 2018-12-05 DIAGNOSIS — Z471 Aftercare following joint replacement surgery: Secondary | ICD-10-CM | POA: Diagnosis not present

## 2018-12-05 DIAGNOSIS — M87051 Idiopathic aseptic necrosis of right femur: Secondary | ICD-10-CM | POA: Diagnosis not present

## 2018-12-05 HISTORY — PX: TOTAL HIP ARTHROPLASTY: SHX124

## 2018-12-05 LAB — TYPE AND SCREEN
ABO/RH(D): AB POS
Antibody Screen: NEGATIVE

## 2018-12-05 LAB — PREGNANCY, URINE: Preg Test, Ur: NEGATIVE

## 2018-12-05 SURGERY — ARTHROPLASTY, HIP, TOTAL, ANTERIOR APPROACH
Anesthesia: Spinal | Site: Hip | Laterality: Right

## 2018-12-05 MED ORDER — DIPHENHYDRAMINE HCL 12.5 MG/5ML PO ELIX: 12.5000 mg | ORAL_SOLUTION | ORAL | Status: DC | PRN

## 2018-12-05 MED ORDER — TRANEXAMIC ACID-NACL 1000-0.7 MG/100ML-% IV SOLN
1000.0000 mg | INTRAVENOUS | Status: AC
  Administered 2018-12-05: 1000 mg via INTRAVENOUS
  Filled 2018-12-05: qty 100

## 2018-12-05 MED ORDER — BUPIVACAINE-EPINEPHRINE (PF) 0.25% -1:200000 IJ SOLN
INTRAMUSCULAR | Status: AC
  Filled 2018-12-05: qty 30

## 2018-12-05 MED ORDER — ONDANSETRON HCL 4 MG/2ML IJ SOLN: 4.0000 mg | Freq: Four times a day (QID) | INTRAMUSCULAR | Status: DC | PRN

## 2018-12-05 MED ORDER — PANTOPRAZOLE SODIUM 40 MG PO TBEC
40.0000 mg | DELAYED_RELEASE_TABLET | Freq: Every day | ORAL | Status: DC
  Administered 2018-12-05 – 2018-12-07 (×2): 40 mg via ORAL
  Filled 2018-12-05 (×3): qty 1

## 2018-12-05 MED ORDER — DEXAMETHASONE SODIUM PHOSPHATE 10 MG/ML IJ SOLN
10.0000 mg | Freq: Once | INTRAMUSCULAR | Status: AC
  Administered 2018-12-06: 10 mg via INTRAVENOUS
  Filled 2018-12-05: qty 1

## 2018-12-05 MED ORDER — DEXAMETHASONE SODIUM PHOSPHATE 10 MG/ML IJ SOLN
INTRAMUSCULAR | Status: AC
  Filled 2018-12-05: qty 1

## 2018-12-05 MED ORDER — PROPOFOL 10 MG/ML IV BOLUS
INTRAVENOUS | Status: AC
  Filled 2018-12-05: qty 40

## 2018-12-05 MED ORDER — MIDAZOLAM HCL 5 MG/5ML IJ SOLN
INTRAMUSCULAR | Status: DC | PRN
  Administered 2018-12-05: 2 mg via INTRAVENOUS

## 2018-12-05 MED ORDER — ONDANSETRON HCL 4 MG PO TABS: 4.0000 mg | ORAL_TABLET | Freq: Four times a day (QID) | ORAL | Status: DC | PRN

## 2018-12-05 MED ORDER — SODIUM CHLORIDE 0.9% FLUSH
INTRAVENOUS | Status: DC | PRN
  Administered 2018-12-05: 50 mL

## 2018-12-05 MED ORDER — GABAPENTIN 300 MG PO CAPS
300.0000 mg | ORAL_CAPSULE | Freq: Three times a day (TID) | ORAL | Status: DC
  Administered 2018-12-05 – 2018-12-07 (×7): 300 mg via ORAL
  Filled 2018-12-05 (×7): qty 1

## 2018-12-05 MED ORDER — METOCLOPRAMIDE HCL 5 MG PO TABS: 5.0000 mg | ORAL_TABLET | Freq: Three times a day (TID) | ORAL | Status: DC | PRN

## 2018-12-05 MED ORDER — CEFAZOLIN SODIUM-DEXTROSE 2-4 GM/100ML-% IV SOLN
2.0000 g | INTRAVENOUS | Status: AC
  Administered 2018-12-05: 2 g via INTRAVENOUS
  Filled 2018-12-05: qty 100

## 2018-12-05 MED ORDER — KCL IN DEXTROSE-NACL 20-5-0.45 MEQ/L-%-% IV SOLN
INTRAVENOUS | Status: DC
  Administered 2018-12-05 – 2018-12-06 (×2): via INTRAVENOUS
  Filled 2018-12-05 (×4): qty 1000

## 2018-12-05 MED ORDER — SODIUM CHLORIDE (PF) 0.9 % IJ SOLN
INTRAMUSCULAR | Status: AC
  Filled 2018-12-05: qty 20

## 2018-12-05 MED ORDER — FENTANYL CITRATE (PF) 100 MCG/2ML IJ SOLN
INTRAMUSCULAR | Status: AC
  Filled 2018-12-05: qty 2

## 2018-12-05 MED ORDER — GABAPENTIN 300 MG PO CAPS: 300.0000 mg | ORAL_CAPSULE | Freq: Three times a day (TID) | ORAL | 2 refills | Status: DC

## 2018-12-05 MED ORDER — METOCLOPRAMIDE HCL 5 MG/ML IJ SOLN: 5.0000 mg | Freq: Three times a day (TID) | INTRAMUSCULAR | Status: DC | PRN

## 2018-12-05 MED ORDER — FLEET ENEMA 7-19 GM/118ML RE ENEM: 1.0000 | ENEMA | Freq: Once | RECTAL | Status: DC | PRN

## 2018-12-05 MED ORDER — ALBUTEROL SULFATE (2.5 MG/3ML) 0.083% IN NEBU
3.0000 mL | INHALATION_SOLUTION | Freq: Four times a day (QID) | RESPIRATORY_TRACT | Status: DC | PRN
  Administered 2018-12-07: 3 mL via RESPIRATORY_TRACT
  Filled 2018-12-05: qty 3

## 2018-12-05 MED ORDER — PHENOL 1.4 % MT LIQD: 1.0000 | OROMUCOSAL | Status: DC | PRN

## 2018-12-05 MED ORDER — DEXAMETHASONE SODIUM PHOSPHATE 10 MG/ML IJ SOLN
INTRAMUSCULAR | Status: DC | PRN
  Administered 2018-12-05: 10 mg via INTRAVENOUS

## 2018-12-05 MED ORDER — ASPIRIN EC 81 MG PO TBEC: 81.0000 mg | DELAYED_RELEASE_TABLET | Freq: Two times a day (BID) | ORAL | 0 refills | Status: DC

## 2018-12-05 MED ORDER — TRANEXAMIC ACID 1000 MG/10ML IV SOLN
INTRAVENOUS | Status: DC | PRN
  Administered 2018-12-05: 2000 mg via TOPICAL

## 2018-12-05 MED ORDER — ALUM & MAG HYDROXIDE-SIMETH 200-200-20 MG/5ML PO SUSP: 30.0000 mL | ORAL | Status: DC | PRN

## 2018-12-05 MED ORDER — OXYCODONE HCL 5 MG/5ML PO SOLN
5.0000 mg | Freq: Once | ORAL | Status: DC | PRN
  Filled 2018-12-05: qty 5

## 2018-12-05 MED ORDER — METHOCARBAMOL 500 MG PO TABS
500.0000 mg | ORAL_TABLET | Freq: Four times a day (QID) | ORAL | Status: DC | PRN
  Administered 2018-12-05 – 2018-12-07 (×7): 500 mg via ORAL
  Filled 2018-12-05 (×8): qty 1

## 2018-12-05 MED ORDER — OXYCODONE HCL 5 MG PO TABS: 5.0000 mg | ORAL_TABLET | Freq: Once | ORAL | Status: DC | PRN

## 2018-12-05 MED ORDER — BUPIVACAINE LIPOSOME 1.3 % IJ SUSP
10.0000 mL | INTRAMUSCULAR | Status: DC
  Filled 2018-12-05: qty 10

## 2018-12-05 MED ORDER — SODIUM CHLORIDE (PF) 0.9 % IJ SOLN
INTRAMUSCULAR | Status: AC
  Filled 2018-12-05: qty 50

## 2018-12-05 MED ORDER — PROPOFOL 500 MG/50ML IV EMUL
INTRAVENOUS | Status: DC | PRN
  Administered 2018-12-05: 25 ug/kg/min via INTRAVENOUS

## 2018-12-05 MED ORDER — BUPIVACAINE LIPOSOME 1.3 % IJ SUSP
INTRAMUSCULAR | Status: DC | PRN
  Administered 2018-12-05: 10 mL

## 2018-12-05 MED ORDER — 0.9 % SODIUM CHLORIDE (POUR BTL) OPTIME
TOPICAL | Status: DC | PRN
  Administered 2018-12-05: 1000 mL

## 2018-12-05 MED ORDER — PROPOFOL 10 MG/ML IV BOLUS
INTRAVENOUS | Status: DC | PRN
  Administered 2018-12-05 (×3): 20 mg via INTRAVENOUS

## 2018-12-05 MED ORDER — FENTANYL CITRATE (PF) 100 MCG/2ML IJ SOLN
INTRAMUSCULAR | Status: DC | PRN
  Administered 2018-12-05: 100 ug via INTRAVENOUS

## 2018-12-05 MED ORDER — OXYCODONE HCL 5 MG PO TABS
5.0000 mg | ORAL_TABLET | ORAL | Status: DC | PRN
  Administered 2018-12-05: 5 mg via ORAL
  Administered 2018-12-05 – 2018-12-07 (×10): 10 mg via ORAL
  Filled 2018-12-05 (×10): qty 2
  Filled 2018-12-05: qty 1

## 2018-12-05 MED ORDER — CHLORHEXIDINE GLUCONATE 4 % EX LIQD: 60.0000 mL | Freq: Once | CUTANEOUS | Status: DC

## 2018-12-05 MED ORDER — LACTATED RINGERS IV SOLN
INTRAVENOUS | Status: DC
  Administered 2018-12-05: 1000 mL via INTRAVENOUS
  Administered 2018-12-05: 15:00:00 via INTRAVENOUS

## 2018-12-05 MED ORDER — ASPIRIN 81 MG PO CHEW
81.0000 mg | CHEWABLE_TABLET | Freq: Two times a day (BID) | ORAL | Status: DC
  Administered 2018-12-05 – 2018-12-07 (×4): 81 mg via ORAL
  Filled 2018-12-05 (×4): qty 1

## 2018-12-05 MED ORDER — LIDOCAINE 2% (20 MG/ML) 5 ML SYRINGE: INTRAMUSCULAR | Status: DC | PRN

## 2018-12-05 MED ORDER — METHOCARBAMOL 1000 MG/10ML IJ SOLN: 500.0000 mg | Freq: Four times a day (QID) | INTRAVENOUS | Status: DC | PRN

## 2018-12-05 MED ORDER — HYDROMORPHONE HCL 1 MG/ML IJ SOLN
0.5000 mg | INTRAMUSCULAR | Status: DC | PRN
  Administered 2018-12-06: 1 mg via INTRAVENOUS
  Filled 2018-12-05: qty 1

## 2018-12-05 MED ORDER — HYDROMORPHONE HCL 1 MG/ML IJ SOLN: 0.2500 mg | INTRAMUSCULAR | Status: DC | PRN

## 2018-12-05 MED ORDER — PROPOFOL 10 MG/ML IV BOLUS
INTRAVENOUS | Status: AC
  Filled 2018-12-05: qty 20

## 2018-12-05 MED ORDER — PROMETHAZINE HCL 25 MG/ML IJ SOLN: 6.2500 mg | INTRAMUSCULAR | Status: DC | PRN

## 2018-12-05 MED ORDER — TRANEXAMIC ACID-NACL 1000-0.7 MG/100ML-% IV SOLN
1000.0000 mg | Freq: Once | INTRAVENOUS | Status: AC
  Administered 2018-12-05: 1000 mg via INTRAVENOUS
  Filled 2018-12-05: qty 100

## 2018-12-05 MED ORDER — TRANEXAMIC ACID-NACL 1000-0.7 MG/100ML-% IV SOLN: 1000.0000 mg | INTRAVENOUS | Status: DC

## 2018-12-05 MED ORDER — OXYCODONE-ACETAMINOPHEN 5-325 MG PO TABS: 1.0000 | ORAL_TABLET | ORAL | 0 refills | Status: DC | PRN

## 2018-12-05 MED ORDER — MIDAZOLAM HCL 2 MG/2ML IJ SOLN
INTRAMUSCULAR | Status: AC
  Filled 2018-12-05: qty 2

## 2018-12-05 MED ORDER — TRANEXAMIC ACID-NACL 1000-0.7 MG/100ML-% IV SOLN: 1000.0000 mg | Freq: Once | INTRAVENOUS | Status: DC

## 2018-12-05 MED ORDER — LIDOCAINE HCL (CARDIAC) PF 100 MG/5ML IV SOSY
PREFILLED_SYRINGE | INTRAVENOUS | Status: DC | PRN
  Administered 2018-12-05: 100 mg via INTRATRACHEAL

## 2018-12-05 MED ORDER — ACETAMINOPHEN 500 MG PO TABS
1000.0000 mg | ORAL_TABLET | Freq: Four times a day (QID) | ORAL | Status: AC
  Administered 2018-12-05 – 2018-12-06 (×3): 1000 mg via ORAL
  Filled 2018-12-05 (×3): qty 2

## 2018-12-05 MED ORDER — METOPROLOL SUCCINATE ER 50 MG PO TB24
50.0000 mg | ORAL_TABLET | Freq: Every day | ORAL | Status: DC
  Administered 2018-12-05 – 2018-12-06 (×2): 50 mg via ORAL
  Filled 2018-12-05 (×3): qty 1

## 2018-12-05 MED ORDER — ONDANSETRON HCL 4 MG/2ML IJ SOLN
INTRAMUSCULAR | Status: DC | PRN
  Administered 2018-12-05: 4 mg via INTRAVENOUS

## 2018-12-05 MED ORDER — TIZANIDINE HCL 2 MG PO TABS: 2.0000 mg | ORAL_TABLET | Freq: Four times a day (QID) | ORAL | 0 refills | Status: DC | PRN

## 2018-12-05 MED ORDER — MUPIROCIN 2 % EX OINT
1.0000 "application " | TOPICAL_OINTMENT | Freq: Two times a day (BID) | CUTANEOUS | Status: DC
  Administered 2018-12-05 – 2018-12-07 (×4): 1 via NASAL
  Filled 2018-12-05 (×2): qty 22

## 2018-12-05 MED ORDER — DOCUSATE SODIUM 100 MG PO CAPS
100.0000 mg | ORAL_CAPSULE | Freq: Two times a day (BID) | ORAL | Status: DC
  Administered 2018-12-05 – 2018-12-07 (×4): 100 mg via ORAL
  Filled 2018-12-05 (×4): qty 1

## 2018-12-05 MED ORDER — POLYETHYLENE GLYCOL 3350 17 G PO PACK: 17.0000 g | PACK | Freq: Every day | ORAL | Status: DC | PRN

## 2018-12-05 MED ORDER — MENTHOL 3 MG MT LOZG: 1.0000 | LOZENGE | OROMUCOSAL | Status: DC | PRN

## 2018-12-05 MED ORDER — BUPIVACAINE-EPINEPHRINE 0.25% -1:200000 IJ SOLN
INTRAMUSCULAR | Status: DC | PRN
  Administered 2018-12-05: 30 mL

## 2018-12-05 MED ORDER — ACETAMINOPHEN 325 MG PO TABS: 325.0000 mg | ORAL_TABLET | Freq: Four times a day (QID) | ORAL | Status: DC | PRN

## 2018-12-05 MED ORDER — BISACODYL 5 MG PO TBEC: 5.0000 mg | DELAYED_RELEASE_TABLET | Freq: Every day | ORAL | Status: DC | PRN

## 2018-12-05 MED ORDER — SERTRALINE HCL 50 MG PO TABS
50.0000 mg | ORAL_TABLET | Freq: Every day | ORAL | Status: DC
  Administered 2018-12-05 – 2018-12-07 (×3): 50 mg via ORAL
  Filled 2018-12-05 (×5): qty 1

## 2018-12-05 SURGICAL SUPPLY — 40 items
BAG DECANTER FOR FLEXI CONT (MISCELLANEOUS) ×2 IMPLANT
BLADE SAW SGTL 18X1.27X75 (BLADE) ×2 IMPLANT
BLADE SURG SZ10 CARB STEEL (BLADE) ×4 IMPLANT
COVER PERINEAL POST (MISCELLANEOUS) ×2 IMPLANT
COVER SURGICAL LIGHT HANDLE (MISCELLANEOUS) ×2 IMPLANT
COVER WAND RF STERILE (DRAPES) ×2 IMPLANT
CUP ACETBLR 48 OD 100 SERIES (Hips) ×2 IMPLANT
DECANTER SPIKE VIAL GLASS SM (MISCELLANEOUS) ×4 IMPLANT
DRAPE STERI IOBAN 125X83 (DRAPES) ×2 IMPLANT
DRAPE U-SHAPE 47X51 STRL (DRAPES) ×4 IMPLANT
DRSG AQUACEL AG ADV 3.5X10 (GAUZE/BANDAGES/DRESSINGS) ×2 IMPLANT
DURAPREP 26ML APPLICATOR (WOUND CARE) ×2 IMPLANT
ELECT REM PT RETURN 15FT ADLT (MISCELLANEOUS) ×2 IMPLANT
ELIMINATOR HOLE APEX DEPUY (Hips) ×2 IMPLANT
GLOVE BIO SURGEON STRL SZ7.5 (GLOVE) ×2 IMPLANT
GLOVE BIO SURGEON STRL SZ8.5 (GLOVE) ×2 IMPLANT
GLOVE BIOGEL PI IND STRL 8 (GLOVE) ×1 IMPLANT
GLOVE BIOGEL PI IND STRL 9 (GLOVE) ×1 IMPLANT
GLOVE BIOGEL PI INDICATOR 8 (GLOVE) ×1
GLOVE BIOGEL PI INDICATOR 9 (GLOVE) ×1
GOWN STRL REUS W/TWL XL LVL3 (GOWN DISPOSABLE) ×4 IMPLANT
HEAD FEMORAL 32 CERAMIC (Hips) ×2 IMPLANT
MANIFOLD NEPTUNE II (INSTRUMENTS) ×2 IMPLANT
NEEDLE HYPO 21X1.5 SAFETY (NEEDLE) ×4 IMPLANT
NS IRRIG 1000ML POUR BTL (IV SOLUTION) ×2 IMPLANT
PACK ANTERIOR HIP CUSTOM (KITS) ×2 IMPLANT
PINN ALTRX NEUT ID X OD 32X48 ×2 IMPLANT
STEM FEM ACTIS STD SZ4 (Stem) ×2 IMPLANT
SUT ETHIBOND NAB CT1 #1 30IN (SUTURE) ×2 IMPLANT
SUT VIC AB 0 CT1 27 (SUTURE) ×1
SUT VIC AB 0 CT1 27XBRD ANBCTR (SUTURE) ×1 IMPLANT
SUT VIC AB 1 CTX 36 (SUTURE) ×1
SUT VIC AB 1 CTX36XBRD ANBCTR (SUTURE) ×1 IMPLANT
SUT VIC AB 2-0 CT1 27 (SUTURE) ×1
SUT VIC AB 2-0 CT1 TAPERPNT 27 (SUTURE) ×1 IMPLANT
SUT VIC AB 3-0 CT1 27 (SUTURE) ×1
SUT VIC AB 3-0 CT1 TAPERPNT 27 (SUTURE) ×1 IMPLANT
SYR CONTROL 10ML LL (SYRINGE) ×6 IMPLANT
TRAY FOLEY CATH 14FR (SET/KITS/TRAYS/PACK) ×2 IMPLANT
YANKAUER SUCT BULB TIP 10FT TU (MISCELLANEOUS) ×2 IMPLANT

## 2018-12-05 NOTE — Anesthesia Preprocedure Evaluation (Addendum)
Anesthesia Evaluation  Patient identified by MRN, date of birth, ID band Patient awake    Reviewed: Allergy & Precautions, NPO status , Patient's Chart, lab work & pertinent test results, reviewed documented beta blocker date and time   Airway Mallampati: III  TM Distance: >3 FB Neck ROM: Full    Dental no notable dental hx.    Pulmonary Current Smoker,  Lung nodule   Pulmonary exam normal breath sounds clear to auscultation       Cardiovascular hypertension, Pt. on home beta blockers and Pt. on medications Normal cardiovascular exam+ dysrhythmias Supra Ventricular Tachycardia  Rhythm:Regular Rate:Normal  ECG: NSR, rate 82   Neuro/Psych PSYCHIATRIC DISORDERS Anxiety Depression negative neurological ROS     GI/Hepatic GERD  Controlled,Fatty liver   Endo/Other  negative endocrine ROS  Renal/GU negative Renal ROS     Musculoskeletal  (+) Fibromyalgia -SACRO-ILIAC PINNING Raynaud's syndrome   Abdominal   Peds  Hematology negative hematology ROS (+)   Anesthesia Other Findings RIGHT HIP AVASCULAR NECROSIS  Reproductive/Obstetrics                            Anesthesia Physical Anesthesia Plan  ASA: III  Anesthesia Plan: Spinal   Post-op Pain Management:    Induction: Intravenous  PONV Risk Score and Plan: 1 and Propofol infusion and Treatment may vary due to age or medical condition  Airway Management Planned: Natural Airway  Additional Equipment:   Intra-op Plan:   Post-operative Plan:   Informed Consent: I have reviewed the patients History and Physical, chart, labs and discussed the procedure including the risks, benefits and alternatives for the proposed anesthesia with the patient or authorized representative who has indicated his/her understanding and acceptance.   Dental advisory given  Plan Discussed with: CRNA  Anesthesia Plan Comments:         Anesthesia Quick  Evaluation

## 2018-12-05 NOTE — Anesthesia Postprocedure Evaluation (Signed)
Anesthesia Post Note  Patient: Deborah Goodman  Procedure(s) Performed: TOTAL HIP ARTHROPLASTY ANTERIOR APPROACH (Right Hip)     Patient location during evaluation: PACU Anesthesia Type: Spinal Level of consciousness: oriented and awake and alert Pain management: pain level controlled Vital Signs Assessment: post-procedure vital signs reviewed and stable Respiratory status: spontaneous breathing, respiratory function stable and patient connected to nasal cannula oxygen Cardiovascular status: blood pressure returned to baseline and stable Postop Assessment: no headache, no backache, no apparent nausea or vomiting and spinal receding Anesthetic complications: no    Last Vitals:  Vitals:   12/05/18 1828 12/05/18 2118  BP: 114/76 118/71  Pulse: 74 63  Resp: 16   Temp: 36.4 C 36.7 C  SpO2: 98% 97%    Last Pain:  Vitals:   12/05/18 2118  TempSrc: Oral  PainSc:                  Karyl Kinnier Mathis Cashman

## 2018-12-05 NOTE — Interval H&P Note (Signed)
History and Physical Interval Note:  12/05/2018 1:26 PM  Deborah Goodman  has presented today for surgery, with the diagnosis of RIGHT HIP AVASCULAR NECROSIS  The various methods of treatment have been discussed with the patient and family. After consideration of risks, benefits and other options for treatment, the patient has consented to  Procedure(s): TOTAL HIP ARTHROPLASTY ANTERIOR APPROACH (Right) as a surgical intervention .  The patient's history has been reviewed, patient examined, no change in status, stable for surgery.  I have reviewed the patient's chart and labs.  Questions were answered to the patient's satisfaction.     Kerin Salen

## 2018-12-05 NOTE — Anesthesia Procedure Notes (Signed)
Spinal  Patient location during procedure: OR Start time: 12/05/2018 1:34 PM End time: 12/05/2018 1:39 PM Staffing Resident/CRNA: Sharlette Dense, CRNA Performed: resident/CRNA  Preanesthetic Checklist Completed: patient identified, site marked, surgical consent, pre-op evaluation, timeout performed, IV checked, risks and benefits discussed and monitors and equipment checked Spinal Block Patient position: sitting Prep: site prepped and draped and DuraPrep Patient monitoring: heart rate, blood pressure and continuous pulse ox Approach: midline Location: L4-5 Injection technique: single-shot Needle Needle type: Sprotte  Needle gauge: 24 G Needle length: 9 cm Additional Notes Kit expiration date 09/18/2019 and lot #[id number] Clear free flow CSF, negative heme, negative paresthesia Tolerated well and returned to supine position

## 2018-12-05 NOTE — Discharge Instructions (Signed)

## 2018-12-05 NOTE — Progress Notes (Signed)
[facility]  Patient Status: New  [facility] is providing the following services: PT  If patient discharges after hours, please call [phone].   Deborah Goodman 12/05/2018, 10:45 AM

## 2018-12-05 NOTE — Transfer of Care (Signed)
Immediate Anesthesia Transfer of Care Note  Patient: Deborah Goodman  Procedure(s) Performed: TOTAL HIP ARTHROPLASTY ANTERIOR APPROACH (Right Hip)  Patient Location: PACU  Anesthesia Type:Spinal  Level of Consciousness: awake, alert  and oriented  Airway & Oxygen Therapy: Patient Spontanous Breathing and Patient connected to face mask oxygen  Post-op Assessment: Report given to RN and Post -op Vital signs reviewed and stable  Post vital signs: Reviewed and stable  Last Vitals:  Vitals Value Taken Time  BP 117/77 12/05/2018  3:27 PM  Temp    Pulse 61 12/05/2018  3:29 PM  Resp 11 12/05/2018  3:29 PM  SpO2 99 % 12/05/2018  3:29 PM  Vitals shown include unvalidated device data.  Last Pain:  Vitals:   12/05/18 1110  TempSrc:   PainSc: 3       Patients Stated Pain Goal: 5 (46/80/32 1224)  Complications: No apparent anesthesia complications

## 2018-12-05 NOTE — Op Note (Signed)
OPERATIVE REPORT    DATE OF PROCEDURE:  12/05/2018       PREOPERATIVE DIAGNOSIS:  RIGHT HIP AVASCULAR NECROSIS                                                          POSTOPERATIVE DIAGNOSIS:  RIGHT HIP AVASCULAR NECROSIS                                                           PROCEDURE: Anterior R total hip arthroplasty using a 48 mm DePuy Pinnacle  Cup, [employer], 0-degree polyethylene liner, a +1 mm x 20mm ceramic head, a 4std Depuy Actis stem   SURGEON: Kerin Salen    ASSISTANT:   Kerry Hough. [employer]  (present throughout entire procedure and necessary for timely completion of the procedure)   ANESTHESIA: Spinal BLOOD LOSS: 300cc cc FLUID REPLACEMENT: 1500 cc crystalloid Antibiotic: 2gm ancef Tranexamic Acid: 1gm IV, 2gm Topical Exparel: 266mg  COMPLICATIONS: none    INDICATIONS FOR PROCEDURE: A 42 y.o. year-old With  [place]   for 2 years, x-rays show bone-on-bone arthritic changes, and osteophytes. Despite conservative measures with observation, anti-inflammatory medicine, narcotics, use of a cane, has severe unremitting pain and can ambulate only a few blocks before resting. Patient desires elective R total hip arthroplasty to decrease pain and increase function. The risks, benefits, and alternatives were discussed at length including but not limited to the risks of infection, bleeding, nerve injury, stiffness, blood clots, the need for revision surgery, cardiopulmonary complications, among others, and they were willing to proceed. Questions answered      PROCEDURE IN DETAIL: The patient was identified by armband,   received preoperative IV antibiotics in the holding area at [facility] taken to the operating room , appropriate anesthetic monitors   were attached and  anesthesia was induced with the patient on the gurney. The HANA boots were applied to the feet and the patient  was transferred to the HANA table with a peroneal post  and support underneath the non-operative leg, which was locked in 2 lb traction. Theoperative lower extremity was then prepped and draped in the usual sterile fashion from just above the iliac crest to the knee. And a timeout procedure was performed. We then made a 12 cm incision along the interval at the leading edge of the tensor fascia lata of starting at 2 cm lateral to the ASIS. Small bleeders in the skin and subcutaneous tissue identified and cauterized we dissected down to the fascia and made an incision in the fascia allowing [place] to elevate the fascia of the tensor muscle and exploited the interval between the rectus and the tensor fascia lata. A Cobra retractor was then placed along the superior neck of the femur. A cerebellar retractor was used to expose the interval between the tensor fascia lata and the rectus femoris. .  We identified and cauterized the ascending branch of the anterior circumflex artery. A second Cobra retractor along the inferior neck of the femur. A small Hohmann retractor was placed underneath the origin of the rectus femoris,  giving [place] good medial exposure. Using Ronguers fatty tissue was removed from in front of the anterior capsule. The capsule was then incised, starting out at the superior anterior aspect of the acetabulum going laterally along the anterior neck. The capsule was then teed along the neck superiorly and inferiorly. Electrocautery was used to release capsule from the anterior and medial neck of the femur to allow external rotation. Cobra retractors were then placed along the inferior and superior neck allowing [place] to perform a standard neck cut and removed the femoral head with a power corkscrew. We then placed a medium bent homan retractor in the cotyloid notch and posteriorly along the acetabular rim a narrow Cobra retractor. Exposed labral tissue was then removed with the electrocautery. We then sequentially reamed up to a 47 mm basket reamer obtaining good  coverage in all quadrants, verified by C-arm imaging. Under C-arm control we then hammered into place a 48 mm Pinnacle cup in 45 of abduction and 15 of anteversion. The cup seated nicely and required no supplemental screws. We then placed a central hole Eliminator and a 0 polyethylene liner. The foot was then externally rotated to 130-140. The limb was extended and adducted delivering the proximal femur up into the wound. A medium curved Hohmann retractor was placed over the greater trochanter and a long Homan retractor along the posterior femoral neck completing the exposure. We then performed releases superiorly and and inferiorly of the capsule going back to the pirformis fossa superiorly and to the lesser trochanter inferiorly. We then entered the proximal femur with the box cutting offset chisel followed by, a canal sounder, the chili pepper and broaching up to a 4std broach. This seated nicely and we reamed the calcar. A trial reduction was performed with a 1.5 mm 32 mm head.The limb lengths were excellent the hip was stable in 90 of external rotation. At this point the trial components removed and we hammered into place a # 4std  Offset Actis stem with Gryption coating. A +1 mm x 32 head was then hammered into place. The hip was reduced and final C-arm images obtained. The wound was thoroughly irrigated with normal saline solution. We repaired the ant capsule and the tensor fascia lot a with running 0 vicryl suture. the subcutaneous tissue was closed with 2-0 and 3-0 Vicryl suture followed by an Aquacil dressing. At this point the patient was awaken and transferred to hospital gurney without difficulty.   Kerin Salen 12/05/2018, 2:48 PM

## 2018-12-05 NOTE — H&P (View-Only) (Signed)
[facility]  Patient Status: New  [facility] is providing the following services: PT  If patient discharges after hours, please call [phone].   Edwinna Areola 12/05/2018, 10:45 AM

## 2018-12-06 ENCOUNTER — Encounter (HOSPITAL_COMMUNITY): Payer: Self-pay | Admitting: Orthopedic Surgery

## 2018-12-06 LAB — CBC
HCT: 37.5 % (ref 36.0–46.0)
Hemoglobin: 12.5 g/dL (ref 12.0–15.0)
MCH: 30.5 pg (ref 26.0–34.0)
MCHC: 33.3 g/dL (ref 30.0–36.0)
MCV: 91.5 fL (ref 80.0–100.0)
Platelets: 267 10*3/uL (ref 150–400)
RBC: 4.1 MIL/uL (ref 3.87–5.11)
RDW: 12.4 % (ref 11.5–15.5)
WBC: 15 10*3/uL — ABNORMAL HIGH (ref 4.0–10.5)
nRBC: 0 % (ref 0.0–0.2)

## 2018-12-06 LAB — BASIC METABOLIC PANEL
Anion gap: 8 (ref 5–15)
BUN: 7 mg/dL (ref 6–20)
CO2: 25 mmol/L (ref 22–32)
Calcium: 8.7 mg/dL — ABNORMAL LOW (ref 8.9–10.3)
Chloride: 102 mmol/L (ref 98–111)
Creatinine, Ser: 0.46 mg/dL (ref 0.44–1.00)
GFR calc Af Amer: >60 mL/min (ref >60)
GFR calc non Af Amer: >60 mL/min (ref >60)
Glucose, Bld: 138 mg/dL — ABNORMAL HIGH (ref 70–99)
Potassium: 4 mmol/L (ref 3.5–5.1)
SODIUM: 135 mmol/L (ref 135–145)

## 2018-12-06 NOTE — Progress Notes (Signed)
Patient ID: Deborah Goodman, female   DOB: Dec 07, 1976, 42 y.o.   MRN: [id number] PATIENT ID: Deborah Goodman  MRN: [id number]  DOB/AGE:  04/29/1976 / 42 y.o.  1 Day Post-Op Procedure(s) (LRB): TOTAL HIP ARTHROPLASTY ANTERIOR APPROACH (Right)    PROGRESS NOTE Subjective: Patient is alert, oriented, No Nausea, No Vomiting, yes passing gas, . Taking PO Well. Denies SOB, Chest or Calf Pain. Using Incentive Spirometer, PAS in place. Ambulate Ambulated in the room no physical therapy as yet Patient reports pain as  5/10  .    Objective: Vital signs in last 24 hours: Vitals:   12/05/18 1828 12/05/18 2118 12/06/18 0156 12/06/18 0508  BP: 114/76 118/71 107/67 124/73  Pulse: 74 63 64 62  Resp: 16  18 18   Temp: 97.6 F (36.4 C) 98 F (36.7 C) 98.2 F (36.8 C) 98.1 F (36.7 C)  TempSrc: Oral Oral Oral Oral  SpO2: 98% 97% 95% 100%  Weight:      Height:          Intake/Output from previous day: I/O last 3 completed shifts: In: 3820.2 [P.O.:360; I.V.:3260.2; IV Piggyback:200] Out: 2175 [Urine:1975; Blood:200]   Intake/Output this shift: Total I/O In: 120 [P.O.:120] Out: 400 [Urine:400]   LABORATORY DATA: Recent Labs    12/06/18 0519  WBC 15.0*  HGB 12.5  HCT 37.5  PLT 267  NA 135  K 4.0  CL 102  CO2 25  BUN 7  CREATININE 0.46  GLUCOSE 138*  CALCIUM 8.7*    Examination: Neurologically intact ABD soft Neurovascular intact Sensation intact distally Dorsiflexion/Plantar flexion intact Incision: scant drainage No cellulitis present Compartment soft minimal discomfort with internal and external rotation} XR AP&Lat of hip shows well placed\fixed THA  Assessment:   1 Day Post-Op Procedure(s) (LRB): TOTAL HIP ARTHROPLASTY ANTERIOR APPROACH (Right) ADDITIONAL DIAGNOSIS:  Expected Acute Blood Loss Anemia, History of PSVT   Plan: PT/OT WBAT, THA  DVT Prophylaxis: SCDx72 hrs, ASA 81 mg BID x 2 weeks  DISCHARGE PLAN: Home, Probably tomorrow patient will need at least  1 day of physical therapy as inpatient  DISCHARGE NEEDS: HHPT, Walker and 3-in-1 comode seat

## 2018-12-06 NOTE — Evaluation (Signed)
Physical Therapy Evaluation Patient Details Name: Deborah Goodman MRN: [id number] DOB: [birth year] Today's Date: 12/06/2018   History of Present Illness  Pt s/p R THR and with hx of Raynauds, Fibromyalgia, and Sacro-iliac fusion  Clinical Impression  Pt s/p R THR and presents with decreased R LE strength/ROM and post op pain limiting functional mobility.  Pt should progress to dc home with very ltd assist and follow up HHPT.    Follow Up Recommendations Home health PT    Equipment Recommendations  Rolling walker with 5" wheels    Recommendations for Other Services OT consult     Precautions / Restrictions Precautions Precautions: Fall Restrictions Weight Bearing Restrictions: No RLE Weight Bearing: Weight bearing as tolerated      Mobility  Bed Mobility Overal bed mobility: Needs Assistance Bed Mobility: Sit to Supine       Sit to supine: Min assist   General bed mobility comments: cues for sequence and use of L LE to self assist  Transfers Overall transfer level: Needs assistance Equipment used: Rolling walker (2 wheeled) Transfers: Sit to/from Stand Sit to Stand: Min assist;Min guard         General transfer comment: cues for LE management and use of UEs to self assist  Ambulation/Gait Ambulation/Gait assistance: Min assist;Min guard Gait Distance (Feet): 120 Feet Assistive device: Rolling walker (2 wheeled) Gait Pattern/deviations: Step-to pattern;Decreased step length - right;Decreased step length - left;Shuffle;Trunk flexed     General Gait Details: cues for sequence, posture, position from RW, increased R heel contact  Stairs            Wheelchair Mobility    Modified Rankin (Stroke Patients Only)       Balance Overall balance assessment: Mild deficits observed, not formally tested                                           Pertinent Vitals/Pain Pain Assessment: 0-10 Pain Score: 5  Pain Location: R hip Pain  Descriptors / Indicators: Aching;Sore Pain Intervention(s): Limited activity within patient's tolerance;Monitored during session;Premedicated before session;Ice applied    Home Living Family/patient expects to be discharged to:: Private residence Living Arrangements: Alone Available Help at Discharge: Family;Available PRN/intermittently Type of Home: Apartment Home Access: Stairs to enter Entrance Stairs-Rails: Psychiatric nurse of Steps: 3 Home Layout: One level Home Equipment: None      Prior Function Level of Independence: Independent               Hand Dominance        Extremity/Trunk Assessment   Upper Extremity Assessment Upper Extremity Assessment: Overall WFL for tasks assessed    Lower Extremity Assessment Lower Extremity Assessment: RLE deficits/detail RLE Deficits / Details: 2/5 strength at hip with AAROM at hip to 90 flex and 10 abd    Cervical / Trunk Assessment Cervical / Trunk Assessment: Normal  Communication   Communication: No difficulties  Cognition Arousal/Alertness: Awake/alert Behavior During Therapy: WFL for tasks assessed/performed Overall Cognitive Status: Within Functional Limits for tasks assessed                                        General Comments      Exercises Total Joint Exercises Ankle Circles/Pumps: AROM;Both;15 reps;Supine Quad Sets: AROM;Both;10 reps;Supine Heel Slides:  AAROM;Right;20 reps;Supine Hip ABduction/ADduction: AAROM;Right;15 reps;Supine   Assessment/Plan    PT Assessment Patient needs continued PT services  PT Problem List Decreased strength;Decreased range of motion;Decreased activity tolerance;Decreased mobility;Decreased knowledge of use of DME;Pain       PT Treatment Interventions DME instruction;Gait training;Stair training;Functional mobility training;Therapeutic activities;Therapeutic exercise;Patient/family education    PT Goals (Current goals can be found in  the Care Plan section)  Acute Rehab PT Goals Patient Stated Goal: Regain IND  PT Goal Formulation: With patient Time For Goal Achievement: 12/13/18 Potential to Achieve Goals: Good    Frequency 7X/week   Barriers to discharge        Co-evaluation               AM-PAC PT "6 Clicks" Mobility  Outcome Measure Help needed turning from your back to your side while in a flat bed without using bedrails?: A Little Help needed moving from lying on your back to sitting on the side of a flat bed without using bedrails?: A Little Help needed moving to and from a bed to a chair (including a wheelchair)?: A Little Help needed standing up from a chair using your arms (e.g., wheelchair or bedside chair)?: A Little Help needed to walk in hospital room?: A Little Help needed climbing 3-5 steps with a railing? : A Little 6 Click Score: 18    End of Session Equipment Utilized During Treatment: Gait belt Activity Tolerance: Patient tolerated treatment well Patient left: in bed;with call bell/phone within reach Nurse Communication: Mobility status PT Visit Diagnosis: Difficulty in walking, not elsewhere classified (R26.2)    Time: 3845-3646 PT Time Calculation (min) (ACUTE ONLY): 29 min   Charges:   PT Evaluation $PT Eval Low Complexity: 1 Low PT Treatments $Therapeutic Exercise: 8-22 mins        Debe Coder PT Acute Rehabilitation Services Pager [phone] Office [phone]   Pamila Mendibles 12/06/2018, 11:15 AM

## 2018-12-06 NOTE — Progress Notes (Signed)
Physical Therapy Treatment Patient Details Name: Deborah Goodman MRN: [id number] DOB: 12/19/1976 Today's Date: 12/06/2018    History of Present Illness Pt s/p R THR and with hx of Raynauds, Fibromyalgia, and Sacro-iliac fusion    PT Comments    Pt progressing steadily with mobility and hopeful for dc home tomorrow.   Follow Up Recommendations  Home health PT     Equipment Recommendations  Rolling walker with 5" wheels    Recommendations for Other Services OT consult     Precautions / Restrictions Precautions Precautions: Fall Restrictions Weight Bearing Restrictions: No RLE Weight Bearing: Weight bearing as tolerated    Mobility  Bed Mobility Overal bed mobility: Needs Assistance Bed Mobility: Sit to Supine;Supine to Sit     Supine to sit: Min guard Sit to supine: Min assist   General bed mobility comments: cues for sequence and use of L LE to self assist  Transfers Overall transfer level: Needs assistance Equipment used: Rolling walker (2 wheeled) Transfers: Sit to/from Stand Sit to Stand: Min guard         General transfer comment: cues for LE management and use of UEs to self assist  Ambulation/Gait Ambulation/Gait assistance: Min guard Gait Distance (Feet): 200 Feet Assistive device: Rolling walker (2 wheeled) Gait Pattern/deviations: Decreased step length - right;Decreased step length - left;Shuffle;Trunk flexed;Step-to pattern;Step-through pattern Gait velocity: decr   General Gait Details: cues for sequence, posture, position from RW, increased R heel contact   Stairs             Wheelchair Mobility    Modified Rankin (Stroke Patients Only)       Balance Overall balance assessment: Mild deficits observed, not formally tested                                          Cognition Arousal/Alertness: Awake/alert Behavior During Therapy: WFL for tasks assessed/performed Overall Cognitive Status: Within Functional  Limits for tasks assessed                                        Exercises Total Joint Exercises Ankle Circles/Pumps: AROM;Both;15 reps;Supine Quad Sets: AROM;Both;10 reps;Supine Heel Slides: AAROM;Right;20 reps;Supine Hip ABduction/ADduction: AAROM;Right;15 reps;Supine    General Comments        Pertinent Vitals/Pain Pain Assessment: 0-10 Pain Score: 5  Pain Location: R hip Pain Descriptors / Indicators: Aching;Sore Pain Intervention(s): Limited activity within patient's tolerance;Monitored during session;Premedicated before session;Ice applied    Home Living                      Prior Function            PT Goals (current goals can now be found in the care plan section) Acute Rehab PT Goals Patient Stated Goal: Regain IND  PT Goal Formulation: With patient Time For Goal Achievement: 12/13/18 Potential to Achieve Goals: Good Progress towards PT goals: Progressing toward goals    Frequency    7X/week      PT Plan      Co-evaluation              AM-PAC PT "6 Clicks" Mobility   Outcome Measure  Help needed turning from your back to your side while in a flat bed without using bedrails?: A  Little Help needed moving from lying on your back to sitting on the side of a flat bed without using bedrails?: A Little Help needed moving to and from a bed to a chair (including a wheelchair)?: A Little Help needed standing up from a chair using your arms (e.g., wheelchair or bedside chair)?: A Little Help needed to walk in hospital room?: A Little Help needed climbing 3-5 steps with a railing? : A Little 6 Click Score: 18    End of Session Equipment Utilized During Treatment: Gait belt Activity Tolerance: Patient tolerated treatment well Patient left: in bed;with call bell/phone within reach Nurse Communication: Mobility status PT Visit Diagnosis: Difficulty in walking, not elsewhere classified (R26.2)     Time: 1550-1620 PT Time  Calculation (min) (ACUTE ONLY): 30 min  Charges:  $Gait Training: 8-22 mins $Therapeutic Exercise: 8-22 mins                     [place] Pager [phone] Office [phone]    Ralphie Lovelady 12/06/2018, 4:46 PM

## 2018-12-07 LAB — CBC
HCT: 35.5 % — ABNORMAL LOW (ref 36.0–46.0)
Hemoglobin: 11.6 g/dL — ABNORMAL LOW (ref 12.0–15.0)
MCH: 30.9 pg (ref 26.0–34.0)
MCHC: 32.7 g/dL (ref 30.0–36.0)
MCV: 94.4 fL (ref 80.0–100.0)
Platelets: 231 10*3/uL (ref 150–400)
RBC: 3.76 MIL/uL — ABNORMAL LOW (ref 3.87–5.11)
RDW: 12.7 % (ref 11.5–15.5)
WBC: 13.4 10*3/uL — AB (ref 4.0–10.5)
nRBC: 0 % (ref 0.0–0.2)

## 2018-12-07 MED FILL — GABAPENTIN 300 MG CAPSULE: 300 | 30 days supply | Qty: 90 | Fill #0

## 2018-12-07 MED FILL — ASPIRIN LOW DOSE 81 MG TBEC: 81 | 30 days supply | Qty: 60 | Fill #0

## 2018-12-07 MED FILL — OXYCODONE-ACETAMINOPHEN 5-3: 5-325 | 5 days supply | Qty: 30 | Fill #0

## 2018-12-07 MED FILL — tiZANidine HCL 4 MG TABS: 4 | 15 days supply | Qty: 30 | Fill #0

## 2018-12-07 NOTE — Plan of Care (Signed)
Pt alert and oriented, pain controlled with PO pain meds.  Plan to d/c home today per MD order. Progressing well with therapy. RN will monitor.

## 2018-12-07 NOTE — Progress Notes (Signed)
Occupational Therapy Treatment Patient Details Name: Deborah Goodman MRN: [id number] DOB: Apr 08, 1976 Today's Date: 12/07/2018    History of present illness Pt s/p R THR and with hx of Raynauds, Fibromyalgia, and Sacro-iliac fusion   OT comments  All education completed this session  Follow Up Recommendations  Supervision - Intermittent    Equipment Recommendations  None recommended by OT    Recommendations for Other Services      Precautions / Restrictions Precautions Precautions: Fall Restrictions Weight Bearing Restrictions: No RLE Weight Bearing: Weight bearing as tolerated       Mobility Bed Mobility Overal bed mobility: Needs Assistance Bed Mobility: Supine to Sit;Sit to Supine     Supine to sit: Supervision Sit to supine: Min guard   General bed mobility comments: using leg lifter  Transfers Overall transfer level: Needs assistance Equipment used: Rolling walker (2 wheeled) Transfers: Sit to/from Stand Sit to Stand: Supervision         General transfer comment: cues for LE placement when sitting    Balance Overall balance assessment: Mild deficits observed, not formally tested                                         ADL either performed or assessed with clinical judgement   ADL                           Toilet Transfer: Min guard;Ambulation;RW;Grab bars;BSC   Toileting- Clothing Manipulation and Hygiene: Supervision/safety;Sit to/from stand         General ADL Comments: ambulated to gym with min guard.  Demonstrated tub transfer with grab bars (which she is having installed) vs tub bench. Her dad has one of these, but she is not sure if he is using it at this time. Pt reports that her tub is higher than ours and so is her commode. She is not putting full weight on RLE. She is not ready to step over tub at this time and verbalizes comfort with sponge bathing initially.   Pt used leg lifter OOB     Vision        Perception     Praxis      Cognition Arousal/Alertness: Awake/alert Behavior During Therapy: WFL for tasks assessed/performed Overall Cognitive Status: Within Functional Limits for tasks assessed                                          Exercises     Shoulder Instructions       General Comments      Pertinent Vitals/ Pain       Pain Assessment: 0-10 Pain Score: 7  Pain Location: R hip Pain Descriptors / Indicators: Burning Pain Intervention(s): Limited activity within patient's tolerance;Monitored during session;Premedicated before session;Repositioned;Ice applied  Home Living                                          Prior Functioning/Environment              Frequency           Progress Toward Goals  OT Goals(current goals can now be found in  the care plan section)  Progress towards OT goals: Progressing toward goals(verbalizes understanding of all education)  Acute Rehab OT Goals Patient Stated Goal: Regain IND   Plan      Co-evaluation                 AM-PAC OT "6 Clicks" Daily Activity     Outcome Measure   Help from another person eating meals?: None Help from another person taking care of personal grooming?: A Little Help from another person toileting, which includes using toliet, bedpan, or urinal?: A Little Help from another person bathing (including washing, rinsing, drying)?: A Little Help from another person to put on and taking off regular upper body clothing?: A Little Help from another person to put on and taking off regular lower body clothing?: A Little 6 Click Score: 19    End of Session    OT Visit Diagnosis: Pain Pain - Right/Left: Right Pain - part of body: Hip   Activity Tolerance Patient tolerated treatment well   Patient Left in chair;with call bell/phone within reach   Nurse Communication          Time: 7622-6333 OT Time Calculation (min): 22 min  Charges: OT General  Charges $OT Visit: 1 Visit OT Treatments $Self Care/Home Management : 8-22 mins  Lesle Chris, OTR/L Acute Rehabilitation Services [phone] [facility] pager [phone] office 12/07/2018   Deborah Goodman 12/07/2018, 2:18 PM

## 2018-12-07 NOTE — Progress Notes (Signed)
Physical Therapy Treatment Patient Details Name: Deborah Goodman MRN: [id number] DOB: June 16, 1976 Today's Date: 12/07/2018    History of Present Illness Pt s/p R THR and with hx of Raynauds, Fibromyalgia, and Sacro-iliac fusion    PT Comments    Pt looking forward to dc home.  Reviewed car transfers, stairs, and therex program with written instruction provided.   Follow Up Recommendations  Home health PT     Equipment Recommendations  Rolling walker with 5" wheels    Recommendations for Other Services OT consult     Precautions / Restrictions Precautions Precautions: Fall Restrictions Weight Bearing Restrictions: No RLE Weight Bearing: Weight bearing as tolerated    Mobility  Bed Mobility Overal bed mobility: Needs Assistance Bed Mobility: Supine to Sit;Sit to Supine     Supine to sit: Supervision Sit to supine: Min guard   General bed mobility comments: Pt reports in/out bed several times with use of leg lifter  Transfers Overall transfer level: Needs assistance Equipment used: Rolling walker (2 wheeled) Transfers: Sit to/from Stand Sit to Stand: Supervision         General transfer comment: cues for LE placement when sitting  Ambulation/Gait Ambulation/Gait assistance: Min guard;Supervision Gait Distance (Feet): 100 Feet Assistive device: Rolling walker (2 wheeled) Gait Pattern/deviations: Decreased step length - right;Decreased step length - left;Shuffle;Trunk flexed;Step-to pattern;Step-through pattern Gait velocity: decr   General Gait Details: cues for sequence, posture, position from RW, increased R heel contact   Stairs Stairs: Yes Stairs assistance: Min assist Stair Management: One rail Right;Step to pattern;Forwards;With crutches Number of Stairs: 3 General stair comments: Reviewed verbally and written instruction provided.  Pt declines to reattempt   Wheelchair Mobility    Modified Rankin (Stroke Patients Only)       Balance  Overall balance assessment: Mild deficits observed, not formally tested                                          Cognition Arousal/Alertness: Awake/alert Behavior During Therapy: WFL for tasks assessed/performed Overall Cognitive Status: Within Functional Limits for tasks assessed                                        Exercises      General Comments        Pertinent Vitals/Pain Pain Assessment: 0-10 Pain Score: 5  Pain Location: R hip Pain Descriptors / Indicators: Burning Pain Intervention(s): Limited activity within patient's tolerance;Monitored during session;Premedicated before session    Home Living                      Prior Function            PT Goals (current goals can now be found in the care plan section) Acute Rehab PT Goals Patient Stated Goal: Regain IND  PT Goal Formulation: With patient Time For Goal Achievement: 12/13/18 Potential to Achieve Goals: Good Progress towards PT goals: Progressing toward goals    Frequency    7X/week      PT Plan Current plan remains appropriate    Co-evaluation              AM-PAC PT "6 Clicks" Mobility   Outcome Measure  Help needed turning from your back to your side while in a  flat bed without using bedrails?: A Little Help needed moving from lying on your back to sitting on the side of a flat bed without using bedrails?: A Little Help needed moving to and from a bed to a chair (including a wheelchair)?: A Little Help needed standing up from a chair using your arms (e.g., wheelchair or bedside chair)?: A Little Help needed to walk in hospital room?: A Little Help needed climbing 3-5 steps with a railing? : A Little 6 Click Score: 18    End of Session Equipment Utilized During Treatment: Gait belt Activity Tolerance: Patient limited by fatigue Patient left: Other (comment)(sitting EOB) Nurse Communication: Mobility status PT Visit Diagnosis: Difficulty in  walking, not elsewhere classified (R26.2)     Time: 1657-9038 PT Time Calculation (min) (ACUTE ONLY): 15 min  Charges:  $Gait Training: 8-22 mins $Therapeutic Activity: 8-22 mins                     [place] Pager [phone] Office [phone]    Yarden Manuelito 12/07/2018, 3:42 PM

## 2018-12-07 NOTE — Progress Notes (Signed)
PATIENT ID: Deborah Goodman  MRN: [id number]  DOB/AGE:  September 05, 1976 / 42 y.o.  2 Days Post-Op Procedure(s) (LRB): TOTAL HIP ARTHROPLASTY ANTERIOR APPROACH (Right)    PROGRESS NOTE Subjective: Patient is alert, oriented, no Nausea, no Vomiting, yes passing gas, . Taking PO well. Denies SOB, Chest or Calf Pain. Using Incentive Spirometer, PAS in place. Ambulate WBAT with pt walking 200 ft with therapy Patient reports pain as  moderate  .    Objective: Vital signs in last 24 hours: Vitals:   12/06/18 1020 12/06/18 1339 12/06/18 2119 12/07/18 0613  BP: 116/79 138/88 126/85 115/87  Pulse: 73 68 68 73  Resp: 18 18 16 18   Temp: 98.1 F (36.7 C) (!) 97.4 F (36.3 C) 97.6 F (36.4 C) 98.5 F (36.9 C)  TempSrc: Oral  Oral   SpO2: 98% 100% 100% 99%  Weight:      Height:          Intake/Output from previous day: I/O last 3 completed shifts: In: 2548.3 [P.O.:1080; I.V.:1468.3] Out: 2100 [Urine:2100]   Intake/Output this shift: No intake/output data recorded.   LABORATORY DATA: Recent Labs    12/06/18 0519 12/07/18 0515  WBC 15.0* 13.4*  HGB 12.5 11.6*  HCT 37.5 35.5*  PLT 267 231  NA 135  --   K 4.0  --   CL 102  --   CO2 25  --   BUN 7  --   CREATININE 0.46  --   GLUCOSE 138*  --   CALCIUM 8.7*  --     Examination: Neurologically intact Neurovascular intact Sensation intact distally Intact pulses distally Dorsiflexion/Plantar flexion intact Incision: dressing C/D/I and moderate drainage No cellulitis present Compartment soft} XR AP&Lat of hip shows well placed\fixed THA  Assessment:   2 Days Post-Op Procedure(s) (LRB): TOTAL HIP ARTHROPLASTY ANTERIOR APPROACH (Right) ADDITIONAL DIAGNOSIS:  Expected Acute Blood Loss Anemia, history of PSVT  Plan: PT/OT WBAT, THA  DVT Prophylaxis: SCDx72 hrs, ASA 81 mg BID x 2 weeks  DISCHARGE PLAN: Home, later today as her ride is not available until late this afternoon.  DISCHARGE NEEDS: HHPT, Walker and 3-in-1 comode  seat

## 2018-12-07 NOTE — Discharge Summary (Signed)
Patient ID: Deborah Goodman MRN: [id number] DOB/AGE: May 05, 1976 42 y.o.  Admit date: 12/05/2018 Discharge date: 12/07/2018  Admission Diagnoses:  Active Problems:   History of total hip arthroplasty, right   Discharge Diagnoses:  Same  Past Medical History:  Diagnosis Date  . Acid reflux   . Avascular necrosis ([place]) 08/2018   Right hip  . Complication of anesthesia    needs glide scope because trachea sit to the side not in the middle after SI surgery did not have a voice  . Depression   . Fatty liver 04/2017   noted on CT Chest  . Fibromyalgia   . GAD (generalized anxiety disorder) 02/10/2017  . Hypertension 02/10/2017  . Lung nodule 04/2017   Stable 4 mm nodule in the periphery of the lateral segment , right, noted on CT Chest  . Palpitations 02/10/2017   a. 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   . Pneumonia 01/2018  . Raynaud's syndrome   . Splenomegaly 04/2017   noted on CT Chest  . SVT (supraventricular tachycardia) ([place])   . Systolic murmur   . Tendinopathy of rotator cuff    Right  . Tobacco use     Surgeries: Procedure(s): TOTAL HIP ARTHROPLASTY ANTERIOR APPROACH on 12/05/2018   Consultants:   Discharged Condition: Improved  Hospital Course: Deborah Goodman is an 42 y.o. female who was admitted 12/05/2018 for operative treatment of<principal problem not specified>. Patient has severe unremitting pain that affects sleep, daily activities, and work/hobbies. After pre-op clearance the patient was taken to the operating room on 12/05/2018 and underwent  Procedure(s): TOTAL HIP ARTHROPLASTY ANTERIOR APPROACH.    Patient was given perioperative antibiotics:  Anti-infectives (From admission, onward)   Start     Dose/Rate Route Frequency Ordered Stop   12/05/18 1100  ceFAZolin (ANCEF) IVPB 2g/100 mL premix     2 g 200 mL/hr over 30 Minutes Intravenous On call to O.R. 12/05/18 1055 12/05/18 1352       Patient was given  sequential compression devices, early ambulation, and chemoprophylaxis to prevent DVT.  Patient benefited maximally from hospital stay and there were no complications.    Recent vital signs:  Patient Vitals for the past 24 hrs:  BP Temp Temp src Pulse Resp SpO2  12/07/18 0613 115/87 98.5 F (36.9 C) - 73 18 99 %  12/06/18 2119 126/85 97.6 F (36.4 C) Oral 68 16 100 %  12/06/18 1339 138/88 (!) 97.4 F (36.3 C) - 68 18 100 %  12/06/18 1020 116/79 98.1 F (36.7 C) Oral 73 18 98 %     Recent laboratory studies:  Recent Labs    12/06/18 0519 12/07/18 0515  WBC 15.0* 13.4*  HGB 12.5 11.6*  HCT 37.5 35.5*  PLT 267 231  NA 135  --   K 4.0  --   CL 102  --   CO2 25  --   BUN 7  --   CREATININE 0.46  --   GLUCOSE 138*  --   CALCIUM 8.7*  --      Discharge Medications:   Allergies as of 12/07/2018      Reactions   Fish Allergy Nausea And Vomiting   ALL SEAFOOD   Shellfish Allergy Nausea And Vomiting   ALL SEAFOOD   Sulfa Antibiotics Other (See Comments)   Thrush reaction Sores in mouth      Medication List    STOP taking these medications   cyclobenzaprine 10  MG tablet Commonly known as:  FLEXERIL   diclofenac sodium 1 % Gel Commonly known as:  VOLTAREN     TAKE these medications   albuterol 108 (90 Base) MCG/ACT inhaler Commonly known as:  PROVENTIL HFA;VENTOLIN HFA Inhale 2 puffs into the lungs every 6 (six) hours as needed for wheezing.   alendronate 70 MG tablet Commonly known as:  FOSAMAX Take 1 tablet (70 mg total) by mouth every 7 (seven) days.   aspirin EC 81 MG tablet Take 1 tablet (81 mg total) by mouth 2 (two) times daily.   gabapentin 300 MG capsule Commonly known as:  NEURONTIN Take 1 capsule (300 mg total) by mouth 3 (three) times daily.   metoprolol succinate 50 MG 24 hr tablet Commonly known as:  TOPROL-XL Take 1 tablet (50 mg total) by mouth daily. Take with or immediately following a meal.   oxyCODONE-acetaminophen 5-325 MG  tablet Commonly known as:  PERCOCET/ROXICET Take 1 tablet by mouth every 4 (four) hours as needed for severe pain.   sertraline 50 MG tablet Commonly known as:  ZOLOFT Take 1 tablet (50 mg total) by mouth daily.   tiZANidine 2 MG tablet Commonly known as:  ZANAFLEX Take 1 tablet (2 mg total) by mouth every 6 (six) hours as needed.   Vitamin D (Ergocalciferol) 1.25 MG (50000 UT) Caps capsule Commonly known as:  DRISDOL Take 1 capsule (50,000 Units total) by mouth every 7 (seven) days. For 12 weeks            Durable Medical Equipment  (From admission, onward)         Start     Ordered   12/05/18 1703  DME Walker rolling  Once    Question:  Patient needs a walker to treat with the following condition  Answer:  Status post right hip replacement   12/05/18 1702   12/05/18 1703  DME 3 n 1  Once     12/05/18 1702           Discharge Care Instructions  (From admission, onward)         Start     Ordered   12/07/18 0000  Weight bearing as tolerated     12/07/18 7829          Diagnostic Studies: Dg C-arm 1-60 Min-no Report  Result Date: 12/05/2018 Fluoroscopy was utilized by the requesting physician.  No radiographic interpretation.   Dg Hip Operative Unilat W Or W/o Pelvis Right  Result Date: 12/05/2018 CLINICAL DATA:  42 y/o  F; right hip replacement. EXAM: OPERATIVE RIGHT HIP (WITH PELVIS IF PERFORMED) 3 VIEWS TECHNIQUE: Fluoroscopic spot image(s) were submitted for interpretation post-operatively. COMPARISON:  08/15/2018 pelvis and right hip radiographs. FINDINGS: Intraoperative fluoroscopy of right total hip replacement. Fluoro time is 10 seconds. No acute abnormality identified within the field of view. Postsurgical changes within the soft tissues of the right hip. IMPRESSION: Intraoperative fluoroscopy of right total hip replacement. Fluoro time is 10 seconds. Electronically Signed   By: Kristine Garbe M.D.   On: 12/05/2018 14:59   Dg Femur, Min 2  Views Right  Result Date: 11/09/2018 CLINICAL DATA:  Femur pain. EXAM: RIGHT FEMUR 2 VIEWS COMPARISON:  MRI 09/10/2018 FINDINGS: Previously seen avascular necrosis changes seen by MRI not appreciable by plain film. No collapse of the femoral head. No acute bony abnormality. Specifically, no fracture, subluxation, or dislocation. IMPRESSION: No acute bony abnormality. Electronically Signed   By: Rolm Baptise M.D.   On:  11/09/2018 09:06    Disposition: Discharge disposition: 01-Home or Self Care       Discharge Instructions    Call MD / Call 911   Complete by:  As directed    If you experience chest pain or shortness of breath, CALL 911 and be transported to the hospital emergency room.  If you develope a fever above 101 F, pus (white drainage) or increased drainage or redness at the wound, or calf pain, call your surgeon's office.   Constipation Prevention   Complete by:  As directed    Drink plenty of fluids.  Prune juice may be helpful.  You may use a stool softener, such as Colace (over the counter) 100 mg twice a day.  Use MiraLax (over the counter) for constipation as needed.   Diet - low sodium heart healthy   Complete by:  As directed    Driving restrictions   Complete by:  As directed    No driving for 2 weeks   Increase activity slowly as tolerated   Complete by:  As directed    Patient may shower   Complete by:  As directed    You may shower without a dressing once there is no drainage.  Do not wash over the wound.  If drainage remains, cover wound with plastic wrap and then shower.   Weight bearing as tolerated   Complete by:  As directed       Follow-up Information    Frederik Pear, MD In 2 weeks.   Specialty:  Orthopedic Surgery Contact information: [address] [place] [address] [phone]            Signed: Joanell Rising 12/07/2018, 9:27 AM

## 2018-12-07 NOTE — Progress Notes (Signed)
Discharge paperwork discussed w pt at the bedside.  She demonstrated understanding. Pt was escorted by wheelchair in stable condition to main lobby.

## 2018-12-07 NOTE — Evaluation (Signed)
Occupational Therapy Evaluation Patient Details Name: Deborah Goodman MRN: [id number] DOB: 1976-06-18 Today's Date: 12/07/2018    History of Present Illness Pt s/p R THR and with hx of Raynauds, Fibromyalgia, and Sacro-iliac fusion   Clinical Impression   This 42 year old female was admitted for the above sx.  She performed ADL and needed cues not to push through pain. She will benefit from one more session of OT to review tub transfer    Follow Up Recommendations  Supervision - Intermittent    Equipment Recommendations  None recommended by OT    Recommendations for Other Services       Precautions / Restrictions Precautions Precautions: Fall Restrictions Weight Bearing Restrictions: No RLE Weight Bearing: Weight bearing as tolerated      Mobility Bed Mobility         Supine to sit: (sitting eob) Sit to supine: Min guard   General bed mobility comments: used gait belt to self assist RLE back into bed  Transfers   Equipment used: Rolling walker (2 wheeled)   Sit to Stand: Supervision         General transfer comment: cues for UE/LE placement    Balance                                           ADL either performed or assessed with clinical judgement   ADL Overall ADL's : Needs assistance/impaired Eating/Feeding: Independent   Grooming: Oral care;Set up;Standing;Supervision/safety   Upper Body Bathing: Set up;Sitting   Lower Body Bathing: Sit to/from stand;Supervison/ safety;Min guard;Set up   Upper Body Dressing : Set up;Sitting   Lower Body Dressing: Minimal assistance;Sit to/from stand(only for R ted hose)   Toilet Transfer: Min guard;Ambulation;Comfort height toilet;RW;Grab bars   Toileting- Clothing Manipulation and Hygiene: Min guard;Sit to/from stand         General ADL Comments: ambulated to bathroom and performed ADL. Pt really wants to be independent, but she will need assistance with ted hose. Able to doff both,  using tongs on R; cannot comfortably don and cued not to push past pain.  Sister can assist intermittently     Vision         Perception     Praxis      Pertinent Vitals/Pain Pain Assessment: Faces Faces Pain Scale: Hurts even more Pain Location: R hip Pain Descriptors / Indicators: Burning Pain Intervention(s): Limited activity within patient's tolerance;Monitored during session;Premedicated before session;Repositioned;Ice applied     Hand Dominance     Extremity/Trunk Assessment             Communication Communication Communication: No difficulties   Cognition Arousal/Alertness: Awake/alert Behavior During Therapy: WFL for tasks assessed/performed Overall Cognitive Status: Within Functional Limits for tasks assessed                                     General Comments       Exercises     Shoulder Instructions      Home Living Family/patient expects to be discharged to:: Private residence Living Arrangements: Alone                 Bathroom Shower/Tub: Teacher, early years/pre: Handicapped height         Additional Comments: apt will install  grab bar in tub      Prior Functioning/Environment Level of Independence: Independent                 OT Problem List: Pain;Decreased knowledge of use of DME or AE;Decreased knowledge of precautions      OT Treatment/Interventions: Self-care/ADL training;DME and/or AE instruction;Energy conservation;Patient/family education    OT Goals(Current goals can be found in the care plan section) Acute Rehab OT Goals Patient Stated Goal: Regain IND  OT Goal Formulation: With patient Time For Goal Achievement: 12/14/18 Potential to Achieve Goals: Good ADL Goals Pt Will Perform Tub/Shower Transfer: (P) Tub transfer;with min guard assist;ambulating(? with seat vs verbalize sequence)  OT Frequency: Min 2X/week   Barriers to D/C:            Co-evaluation               AM-PAC OT "6 Clicks" Daily Activity     Outcome Measure Help from another person eating meals?: None Help from another person taking care of personal grooming?: A Little Help from another person toileting, which includes using toliet, bedpan, or urinal?: A Little Help from another person bathing (including washing, rinsing, drying)?: A Little Help from another person to put on and taking off regular upper body clothing?: A Little Help from another person to put on and taking off regular lower body clothing?: A Little 6 Click Score: 19   End of Session    Activity Tolerance: Patient tolerated treatment well Patient left: in bed;with call bell/phone within reach  OT Visit Diagnosis: Pain Pain - Right/Left: Right Pain - part of body: Hip                Time: 8299-3716 OT Time Calculation (min): 46 min Charges:  OT General Charges $OT Visit: 1 Visit OT Evaluation $OT Eval Low Complexity: 1 Low OT Treatments $Self Care/Home Management : 23-37 mins  Lesle Chris, OTR/L Acute Rehabilitation Services [phone] [facility] pager [phone] office 12/07/2018  [name] 12/07/2018, 10:09 AM

## 2018-12-07 NOTE — Progress Notes (Signed)
Physical Therapy Treatment Patient Details Name: Deborah Goodman MRN: [id number] DOB: [birth year] Today's Date: 12/07/2018    History of Present Illness Pt s/p R THR and with hx of Raynauds, Fibromyalgia, and Sacro-iliac fusion    PT Comments    Steady progress with mobility.  Pt continues to struggle with bed mobility but able to perform with assist of leg lifter.  Pt negotiated stairs with am with min assist and use of crutch and rail.   Follow Up Recommendations  Home health PT     Equipment Recommendations  Rolling walker with 5" wheels    Recommendations for Other Services OT consult     Precautions / Restrictions Precautions Precautions: Fall Restrictions Weight Bearing Restrictions: No RLE Weight Bearing: Weight bearing as tolerated    Mobility  Bed Mobility Overal bed mobility: Needs Assistance Bed Mobility: Supine to Sit;Sit to Supine     Supine to sit: Min guard Sit to supine: Min guard   General bed mobility comments: used leg lifter to self assist RLE back into bed  Transfers Overall transfer level: Needs assistance Equipment used: Rolling walker (2 wheeled) Transfers: Sit to/from Stand Sit to Stand: Supervision         General transfer comment: cues for UE/LE placement  Ambulation/Gait Ambulation/Gait assistance: Min guard;Supervision Gait Distance (Feet): 100 Feet Assistive device: Rolling walker (2 wheeled) Gait Pattern/deviations: Decreased step length - right;Decreased step length - left;Shuffle;Trunk flexed;Step-to pattern;Step-through pattern Gait velocity: decr   General Gait Details: cues for sequence, posture, position from RW, increased R heel contact   Stairs Stairs: Yes Stairs assistance: Min assist Stair Management: One rail Right;Step to pattern;Forwards;With crutches Number of Stairs: 3 General stair comments: cues for sequence and foot/crutch placement   Wheelchair Mobility    Modified Rankin (Stroke Patients  Only)       Balance Overall balance assessment: Mild deficits observed, not formally tested                                          Cognition Arousal/Alertness: Awake/alert Behavior During Therapy: WFL for tasks assessed/performed Overall Cognitive Status: Within Functional Limits for tasks assessed                                        Exercises      General Comments        Pertinent Vitals/Pain Pain Assessment: 0-10 Pain Score: 7 (with OOB activity) Faces Pain Scale: Hurts even more Pain Location: R hip Pain Descriptors / Indicators: Burning Pain Intervention(s): Limited activity within patient's tolerance;Monitored during session;Premedicated before session;Ice applied    Home Living Family/patient expects to be discharged to:: Private residence Living Arrangements: Alone             Additional Comments: apt will install grab bar in tub    Prior Function Level of Independence: Independent          PT Goals (current goals can now be found in the care plan section) Acute Rehab PT Goals Patient Stated Goal: Regain IND  PT Goal Formulation: With patient Time For Goal Achievement: 12/13/18 Potential to Achieve Goals: Good Progress towards PT goals: Progressing toward goals    Frequency    7X/week      PT Plan Current plan remains appropriate  Co-evaluation              AM-PAC PT "6 Clicks" Mobility   Outcome Measure  Help needed turning from your back to your side while in a flat bed without using bedrails?: A Little Help needed moving from lying on your back to sitting on the side of a flat bed without using bedrails?: A Little Help needed moving to and from a bed to a chair (including a wheelchair)?: A Little Help needed standing up from a chair using your arms (e.g., wheelchair or bedside chair)?: A Little Help needed to walk in hospital room?: A Little Help needed climbing 3-5 steps with a railing?  : A Little 6 Click Score: 18    End of Session Equipment Utilized During Treatment: Gait belt Activity Tolerance: Patient tolerated treatment well;Patient limited by pain Patient left: in bed;with call bell/phone within reach Nurse Communication: Mobility status PT Visit Diagnosis: Difficulty in walking, not elsewhere classified (R26.2)     Time: 1022-1050 PT Time Calculation (min) (ACUTE ONLY): 28 min  Charges:  $Gait Training: 8-22 mins $Therapeutic Activity: 8-22 mins                     Debe Coder PT Acute Rehabilitation Services Pager [phone] Office [phone]    Deborah Goodman 12/07/2018, 12:24 PM

## 2018-12-09 ENCOUNTER — Encounter (HOSPITAL_COMMUNITY): Payer: Self-pay

## 2018-12-09 ENCOUNTER — Observation Stay (HOSPITAL_COMMUNITY)
Admission: EM | Admit: 2018-12-09 | Discharge: 2018-12-10 | Disposition: A | Discharge status: Home / Self Care | Payer: 59 | Attending: Orthopedic Surgery | Admitting: Orthopedic Surgery

## 2018-12-09 DIAGNOSIS — L0390 Cellulitis, unspecified: Secondary | ICD-10-CM | POA: Diagnosis not present

## 2018-12-09 DIAGNOSIS — F17200 Nicotine dependence, unspecified, uncomplicated: Secondary | ICD-10-CM | POA: Diagnosis not present

## 2018-12-09 DIAGNOSIS — Z96641 Presence of right artificial hip joint: Secondary | ICD-10-CM | POA: Insufficient documentation

## 2018-12-09 DIAGNOSIS — L03116 Cellulitis of left lower limb: Secondary | ICD-10-CM | POA: Diagnosis not present

## 2018-12-09 DIAGNOSIS — Z91012 Allergy to eggs: Secondary | ICD-10-CM | POA: Diagnosis not present

## 2018-12-09 DIAGNOSIS — F329 Major depressive disorder, single episode, unspecified: Secondary | ICD-10-CM | POA: Diagnosis not present

## 2018-12-09 DIAGNOSIS — Z79899 Other long term (current) drug therapy: Secondary | ICD-10-CM | POA: Diagnosis not present

## 2018-12-09 DIAGNOSIS — Z881 Allergy status to other antibiotic agents status: Secondary | ICD-10-CM | POA: Diagnosis not present

## 2018-12-09 DIAGNOSIS — Z96649 Presence of unspecified artificial hip joint: Secondary | ICD-10-CM

## 2018-12-09 DIAGNOSIS — I10 Essential (primary) hypertension: Secondary | ICD-10-CM | POA: Diagnosis not present

## 2018-12-09 DIAGNOSIS — R2241 Localized swelling, mass and lump, right lower limb: Secondary | ICD-10-CM | POA: Diagnosis not present

## 2018-12-09 DIAGNOSIS — L03115 Cellulitis of right lower limb: Secondary | ICD-10-CM | POA: Diagnosis not present

## 2018-12-09 DIAGNOSIS — T8149XA Infection following a procedure, other surgical site, initial encounter: Secondary | ICD-10-CM | POA: Diagnosis not present

## 2018-12-09 DIAGNOSIS — F419 Anxiety disorder, unspecified: Secondary | ICD-10-CM | POA: Diagnosis not present

## 2018-12-09 DIAGNOSIS — K219 Gastro-esophageal reflux disease without esophagitis: Secondary | ICD-10-CM | POA: Diagnosis not present

## 2018-12-09 DIAGNOSIS — Z9889 Other specified postprocedural states: Secondary | ICD-10-CM | POA: Diagnosis not present

## 2018-12-09 DIAGNOSIS — L03119 Cellulitis of unspecified part of limb: Secondary | ICD-10-CM | POA: Diagnosis present

## 2018-12-09 MED ORDER — OXYCODONE HCL 5 MG PO TABS
5.0000 mg | ORAL_TABLET | ORAL | Status: DC | PRN
  Administered 2018-12-09: 15 mg via ORAL
  Administered 2018-12-10 (×3): 10 mg via ORAL
  Filled 2018-12-09: qty 3
  Filled 2018-12-09 (×3): qty 2

## 2018-12-09 MED ORDER — TIZANIDINE HCL 4 MG PO TABS
2.0000 mg | ORAL_TABLET | Freq: Four times a day (QID) | ORAL | Status: DC | PRN
  Administered 2018-12-09 – 2018-12-10 (×2): 2 mg via ORAL
  Filled 2018-12-09 (×2): qty 1

## 2018-12-09 MED ORDER — SODIUM CHLORIDE 0.9 % IV SOLN
200.00 | INTRAVENOUS | Status: DC
Start: ? — End: 2018-12-09

## 2018-12-09 MED ORDER — MORPHINE SULFATE (PF) 2 MG/ML IV SOLN: 1.0000 mg | INTRAVENOUS | Status: DC | PRN

## 2018-12-09 MED ORDER — ALBUTEROL SULFATE (2.5 MG/3ML) 0.083% IN NEBU: 2.5000 mg | INHALATION_SOLUTION | Freq: Four times a day (QID) | RESPIRATORY_TRACT | Status: DC | PRN

## 2018-12-09 MED ORDER — DOCUSATE SODIUM 100 MG PO CAPS
100.0000 mg | ORAL_CAPSULE | Freq: Two times a day (BID) | ORAL | Status: DC
  Administered 2018-12-09 – 2018-12-10 (×2): 100 mg via ORAL
  Filled 2018-12-09 (×2): qty 1

## 2018-12-09 MED ORDER — DIPHENHYDRAMINE HCL 12.5 MG/5ML PO ELIX: 12.5000 mg | ORAL_SOLUTION | ORAL | Status: DC | PRN

## 2018-12-09 MED ORDER — ZOLPIDEM TARTRATE 5 MG PO TABS: 5.0000 mg | ORAL_TABLET | Freq: Every evening | ORAL | Status: DC | PRN

## 2018-12-09 MED ORDER — FLEET ENEMA 7-19 GM/118ML RE ENEM: 1.0000 | ENEMA | Freq: Once | RECTAL | Status: DC | PRN

## 2018-12-09 MED ORDER — MORPHINE SULFATE (PF) 4 MG/ML IV SOLN
4.0000 mg | Freq: Once | INTRAVENOUS | Status: AC
  Administered 2018-12-09: 4 mg via INTRAVENOUS
  Filled 2018-12-09: qty 1

## 2018-12-09 MED ORDER — SERTRALINE HCL 50 MG PO TABS
50.0000 mg | ORAL_TABLET | Freq: Every day | ORAL | Status: DC
  Filled 2018-12-09: qty 1

## 2018-12-09 MED ORDER — ASPIRIN EC 81 MG PO TBEC
81.0000 mg | DELAYED_RELEASE_TABLET | Freq: Two times a day (BID) | ORAL | Status: DC
  Administered 2018-12-09 – 2018-12-10 (×2): 81 mg via ORAL
  Filled 2018-12-09 (×2): qty 1

## 2018-12-09 MED ORDER — CEFAZOLIN SODIUM-DEXTROSE 1-4 GM/50ML-% IV SOLN
1.0000 g | Freq: Three times a day (TID) | INTRAVENOUS | Status: AC
  Administered 2018-12-09 – 2018-12-10 (×3): 1 g via INTRAVENOUS
  Filled 2018-12-09 (×3): qty 50

## 2018-12-09 MED ORDER — BISACODYL 5 MG PO TBEC: 5.0000 mg | DELAYED_RELEASE_TABLET | Freq: Every day | ORAL | Status: DC | PRN

## 2018-12-09 MED ORDER — ACETAMINOPHEN 325 MG PO TABS: 650.0000 mg | ORAL_TABLET | Freq: Four times a day (QID) | ORAL | Status: DC | PRN

## 2018-12-09 MED ORDER — ACETAMINOPHEN 650 MG RE SUPP: 650.0000 mg | Freq: Four times a day (QID) | RECTAL | Status: DC | PRN

## 2018-12-09 MED ORDER — METOPROLOL SUCCINATE ER 50 MG PO TB24
50.0000 mg | ORAL_TABLET | Freq: Every day | ORAL | Status: DC
  Filled 2018-12-09: qty 1

## 2018-12-09 MED ORDER — SENNOSIDES-DOCUSATE SODIUM 8.6-50 MG PO TABS: 1.0000 | ORAL_TABLET | Freq: Every evening | ORAL | Status: DC | PRN

## 2018-12-09 MED ORDER — MORPHINE SULFATE (PF) 2 MG/ML IV SOLN
1.0000 mg | INTRAVENOUS | Status: DC | PRN
  Administered 2018-12-09: 2 mg via INTRAVENOUS
  Filled 2018-12-09: qty 1

## 2018-12-09 MED ORDER — ALENDRONATE SODIUM 70 MG PO TABS: 70.0000 mg | ORAL_TABLET | ORAL | Status: DC

## 2018-12-09 MED ORDER — ONDANSETRON HCL 4 MG PO TABS: 4.0000 mg | ORAL_TABLET | Freq: Four times a day (QID) | ORAL | Status: DC | PRN

## 2018-12-09 MED ORDER — ONDANSETRON HCL 4 MG/2ML IJ SOLN: 4.0000 mg | Freq: Four times a day (QID) | INTRAMUSCULAR | Status: DC | PRN

## 2018-12-09 MED ORDER — ALBUTEROL SULFATE HFA 108 (90 BASE) MCG/ACT IN AERS: 2.0000 | INHALATION_SPRAY | Freq: Four times a day (QID) | RESPIRATORY_TRACT | Status: DC | PRN

## 2018-12-09 MED ORDER — GABAPENTIN 300 MG PO CAPS
300.0000 mg | ORAL_CAPSULE | Freq: Three times a day (TID) | ORAL | Status: DC
  Administered 2018-12-09 – 2018-12-10 (×2): 300 mg via ORAL
  Filled 2018-12-09 (×2): qty 1

## 2018-12-09 NOTE — ED Provider Notes (Signed)
[facility] DEPT Provider Note   CSN: [id number] Arrival date & time: 12/09/18  1941     History   Chief Complaint No chief complaint on file.   HPI Deborah Goodman is a 42 y.o. female.  The history is provided by the patient and medical records. No language interpreter was used.   Deborah Goodman is a 42 y.o. female  with a PMH of total right hip arthroplasty by Dr. Mayer Camel on 12/18 who presents to the Emergency Department as transfer from [facility] Emergency Department for right hip pain and swelling. She has had what she believes is expected post-op pain and swelling for the last two days. This morning, when she awoke, she had much more significant amount of pain and also reports swelling was worse as well. Initially, swelling was mostly to the hip, but today, swelling also below the knee including swelling to foot and calf. She denies any fever. Went to ED were she had ultrasound performed which was negative for DVT. She had large amount of erythema to the thigh per ED provider at [facility].  She was started on vancomycin and Zosyn at [facility] as well.  Per chart review, ED provider there attempted to admit her here as she is followed by orthopedics in [place], but providers wanted to see patient prior to admitting.  She was then transferred as ED to ED transfer.   Past Medical History:  Diagnosis Date  . Acid reflux   . Avascular necrosis ([place]) 08/2018   Right hip  . Complication of anesthesia    needs glide scope because trachea sit to the side not in the middle after SI surgery did not have a voice  . Depression   . Fatty liver 04/2017   noted on CT Chest  . Fibromyalgia   . GAD (generalized anxiety disorder) 02/10/2017  . Hypertension 02/10/2017  . Lung nodule 04/2017   Stable 4 mm nodule in the periphery of the lateral segment , right, noted on CT Chest  . Palpitations 02/10/2017   a. 02/2017: pSVT noted on event monitor --> started on BB  therapy.   . Pneumonia 01/2018  . Raynaud's syndrome   . Splenomegaly 04/2017   noted on CT Chest  . SVT (supraventricular tachycardia) ([place])   . Systolic murmur   . Tendinopathy of rotator cuff    Right  . Tobacco use     Patient Active Problem List   Diagnosis Date Noted  . Cellulitis of leg 12/09/2018  . History of total hip arthroplasty, right 12/05/2018  . Avascular necrosis ([place]) 09/10/2018  . Acute right hip pain 08/15/2018  . 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 Chronic right shoulder pain 07/23/2018  . Morbid obesity ([place]) 12/25/2017  . Family history of autoimmune disorder 12/18/2017  . Transaminitis 05/05/2017  . Lung nodule < 6cm on CT 05/05/2017  . Fever 04/27/2017  . PSVT (paroxysmal supraventricular tachycardia) ([place]) 03/13/2017  . Tobacco use 03/13/2017  . Palpitations 02/10/2017  . GAD (generalized anxiety disorder) 02/10/2017  . Hypertension 02/10/2017  . Abnormal thyroid blood test 02/10/2017    Past Surgical History:  Procedure Laterality Date  . SACRO-ILIAC PINNING  2016   fusion, in [place]  . TONSILLECTOMY     age 80  . TOTAL HIP ARTHROPLASTY Right 12/05/2018   Procedure: TOTAL HIP ARTHROPLASTY ANTERIOR APPROACH;  Surgeon: Frederik Pear, MD;  Location: [facility] ORS;  Service: Orthopedics;  Laterality: Right;     OB  History    Gravida  0   Para  0   Term  0   Preterm  0   AB  0   Living  0     SAB  0   TAB  0   Ectopic  0   Multiple  0   Live Births  0            Home Medications    Prior to Admission medications   Medication Sig Start Date End Date Taking? Authorizing Provider  aspirin EC 81 MG tablet Take 1 tablet (81 mg total) by mouth 2 (two) times daily. 12/05/18  Yes Joanell Rising K, PA-C  gabapentin (NEURONTIN) 300 MG capsule Take 1 capsule (300 mg total) by mouth 3 (three) times daily. 12/05/18 12/05/19 Yes Leighton Parody, PA-C  metoprolol succinate (TOPROL-XL) 50 MG 24 hr tablet Take 1 tablet  (50 mg total) by mouth daily. Take with or immediately following a meal. 11/30/18  Yes Gregor Hams, MD  oxyCODONE-acetaminophen (PERCOCET/ROXICET) 5-325 MG tablet Take 1 tablet by mouth every 4 (four) hours as needed for severe pain. 12/05/18  Yes Joanell Rising K, PA-C  sertraline (ZOLOFT) 50 MG tablet Take 1 tablet (50 mg total) by mouth daily. 11/30/18  Yes Gregor Hams, MD  tiZANidine (ZANAFLEX) 2 MG tablet Take 1 tablet (2 mg total) by mouth every 6 (six) hours as needed. Patient taking differently: Take 2 mg by mouth every 6 (six) hours as needed for muscle spasms.  12/05/18  Yes Leighton Parody, PA-C  albuterol (PROVENTIL HFA;VENTOLIN HFA) 108 (90 Base) MCG/ACT inhaler Inhale 2 puffs into the lungs every 6 (six) hours as needed for wheezing. 08/08/17   Emeterio Reeve, DO  alendronate (FOSAMAX) 70 MG tablet Take 1 tablet (70 mg total) by mouth every 7 (seven) days. 11/30/18   Gregor Hams, MD  Vitamin D, Ergocalciferol, (DRISDOL) 1.25 MG (50000 UT) CAPS capsule Take 1 capsule (50,000 Units total) by mouth every 7 (seven) days. For 12 weeks 10/31/18   Gregor Hams, MD    Family History Family History  Problem Relation Age of Onset  . Depression Mother   . Hypertension Mother   . Cancer Mother        colon  . Diabetes Father   . Arrhythmia Sister        palpitations  . Cancer Maternal Grandfather        COLON  . Diabetes Paternal Grandfather     Social History Social History   Tobacco Use  . Smoking status: Current Every Day Smoker    Packs/day: 0.50    Years: 22.00    Pack years: 11.00    Types: Cigarettes, E-cigarettes  . Smokeless tobacco: Never Used  Substance Use Topics  . Alcohol use: Yes  . Drug use: No     Allergies   Fish allergy; Shellfish allergy; and Sulfa antibiotics   Review of Systems Review of Systems  Musculoskeletal: Positive for arthralgias and myalgias.  Skin: Positive for color change.  All other systems reviewed and are  negative.    Physical Exam Updated Vital Signs BP 123/84 (BP Location: Right Arm)   Pulse 90   Temp 98.3 F (36.8 C) (Oral)   Resp 17   Ht 5\' 6"  (1.676 m)   Wt 83 kg   LMP 12/01/2018 (Exact Date)   SpO2 98%   BMI 29.53 kg/m   Physical Exam Vitals signs and nursing note reviewed.  Constitutional:      General: She is not in acute distress.    Appearance: She is well-developed.  HENT:     Head: Normocephalic and atraumatic.  Cardiovascular:     Rate and Rhythm: Normal rate and regular rhythm.     Heart sounds: Normal heart sounds. No murmur.  Pulmonary:     Effort: Pulmonary effort is normal. No respiratory distress.     Breath sounds: Normal breath sounds.  Abdominal:     General: There is no distension.     Palpations: Abdomen is soft.     Tenderness: There is no abdominal tenderness.  Skin:    General: Skin is warm and dry.     Comments: Erythema and warmth to left hip and down anterior thigh. Incision site without drainage. 2+ DP. Sensation intact. No calf pain or swelling.   Neurological:     Mental Status: She is alert and oriented to person, place, and time.      ED Treatments / Results  Labs (all labs ordered are listed, but only abnormal results are displayed) Labs Reviewed  CBC    EKG None  Radiology No results found.  Procedures Procedures (including critical care time)  Medications Ordered in ED Medications  aspirin EC tablet 81 mg (has no administration in time range)  metoprolol succinate (TOPROL-XL) 24 hr tablet 50 mg (has no administration in time range)  sertraline (ZOLOFT) tablet 50 mg (has no administration in time range)  gabapentin (NEURONTIN) capsule 300 mg (has no administration in time range)  tiZANidine (ZANAFLEX) tablet 2 mg (has no administration in time range)  acetaminophen (TYLENOL) tablet 650 mg (has no administration in time range)    Or  acetaminophen (TYLENOL) suppository 650 mg (has no administration in time range)   oxyCODONE (Oxy IR/ROXICODONE) immediate release tablet 5-15 mg (has no administration in time range)  zolpidem (AMBIEN) tablet 5 mg (has no administration in time range)  diphenhydrAMINE (BENADRYL) 12.5 MG/5ML elixir 12.5-25 mg (has no administration in time range)  docusate sodium (COLACE) capsule 100 mg (has no administration in time range)  senna-docusate (Senokot-S) tablet 1 tablet (has no administration in time range)  bisacodyl (DULCOLAX) EC tablet 5 mg (has no administration in time range)  sodium phosphate (FLEET) 7-19 GM/118ML enema 1 enema (has no administration in time range)  ondansetron (ZOFRAN) tablet 4 mg (has no administration in time range)    Or  ondansetron (ZOFRAN) injection 4 mg (has no administration in time range)  ceFAZolin (ANCEF) IVPB 1 g/50 mL premix (has no administration in time range)  morphine 2 MG/ML injection 1-2 mg (has no administration in time range)  albuterol (PROVENTIL) (2.5 MG/3ML) 0.083% nebulizer solution 2.5 mg (has no administration in time range)  morphine 4 MG/ML injection 4 mg (4 mg Intravenous Given 12/09/18 2018)     Initial Impression / Assessment and Plan / ED Course  I have reviewed the triage vital signs and the nursing notes.  Pertinent labs & imaging results that were available during my care of the patient were reviewed by me and considered in my medical decision making (see chart for details).     KATHLINE BANBURY is a 42 y.o. female who presents to ED as transfer from outside hospital for concerns for post-operative cellulitis. Patient had total hip arthroplasty by Dr. Hal Morales on 12/18. Started having worsening erythema and swelling to hip and thigh today. Seen at [facility] ED where labs were obtained showing normal white count. RLE ultrasound  done which was negative for DVT as well. She was given dose of vanc and zosyn prior to transfer. Discussed case with orthopedics who has evaluated patient and will admit.    Final Clinical  Impressions(s) / ED Diagnoses   Final diagnoses:  Cellulitis of left lower extremity    ED Discharge Orders    None       Ward, Ozella Almond, PA-C 12/09/18 2119    Malvin Johns, MD 12/09/18 2138

## 2018-12-09 NOTE — ED Notes (Signed)
Pt walking around room with assistance from her walker.

## 2018-12-09 NOTE — H&P (Signed)
Deborah Goodman is an 42 y.o. female.   Chief Complaint: Right lower extremity erythema and swelling HPI: The patient underwent right anterior total hip replacement with Dr. Mayer Camel last week.  She noted increased swelling and erythema today and presented to an outside emergency department.  There was concern for progression and she was given a dose of IV antibiotics at the outside hospital.  She was given the option of going home on an oral antibiotic and following up with Dr. Mayer Camel tomorrow, but she did not feel comfortable going home as she lives alone and felt like this was getting worse.  She was transferred to [facility] long hospital to be admitted.  She has remained afebrile and she had a normal white blood cell count at that hospital.  Past Medical History:  Diagnosis Date  . Acid reflux   . Avascular necrosis ([place]) 08/2018   Right hip  . Complication of anesthesia    needs glide scope because trachea sit to the side not in the middle after SI surgery did not have a voice  . Depression   . Fatty liver 04/2017   noted on CT Chest  . Fibromyalgia   . GAD (generalized anxiety disorder) 02/10/2017  . Hypertension 02/10/2017  . Lung nodule 04/2017   Stable 4 mm nodule in the periphery of the lateral segment , right, noted on CT Chest  . Palpitations 02/10/2017   a. 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   . Pneumonia 01/2018  . Raynaud's syndrome   . Splenomegaly 04/2017   noted on CT Chest  . SVT (supraventricular tachycardia) ([place])   . Systolic murmur   . Tendinopathy of rotator cuff    Right  . Tobacco use     Past Surgical History:  Procedure Laterality Date  . SACRO-ILIAC PINNING  2016   fusion, in [place]  . TONSILLECTOMY     age 43  . TOTAL HIP ARTHROPLASTY Right 12/05/2018   Procedure: TOTAL HIP ARTHROPLASTY ANTERIOR APPROACH;  Surgeon: Frederik Pear, MD;  Location: [facility];  Service: Orthopedics;  Laterality: Right;    Family History  Problem  Relation Age of Onset  . Depression Mother   . Hypertension Mother   . Cancer Mother        colon  . Diabetes Father   . Arrhythmia Sister        palpitations  . Cancer Maternal Grandfather        COLON  . Diabetes Paternal Grandfather    Social History:  reports that she has been smoking cigarettes and e-cigarettes. She has a 11.00 pack-year smoking history. She has never used smokeless tobacco. She reports current alcohol use. She reports that she does not use drugs.  Allergies:  Allergies  Allergen Reactions  . Fish Allergy Nausea And Vomiting    ALL SEAFOOD  . Shellfish Allergy Nausea And Vomiting    ALL SEAFOOD  . Sulfa Antibiotics Other (See Comments)    Thrush reaction Sores in mouth    (Not in a hospital admission)   No results found for this or any previous visit (from the past 48 hour(s)). No results found.  Review of Systems  Musculoskeletal: Positive for back pain.  All other systems reviewed and are negative.   Blood pressure 123/84, pulse 90, temperature 98.3 F (36.8 C), temperature source Oral, resp. rate 17, height 5\' 6"  (1.676 m), weight 83 kg, last menstrual period 12/01/2018, SpO2 98 %. Physical Exam  Constitutional: She is oriented to person, place, and time. She appears well-developed and well-nourished.  HENT:  Head: Atraumatic.  Eyes: EOM are normal.  Cardiovascular: Intact distal pulses.  Respiratory: Effort normal.  Musculoskeletal:     Comments: Right lower extremity anterior dressing on the hip with small old blood.  I lifted up the dressing and looked at the incision beneath which looked benign.  No purulence.  She does have some mild surrounding blanching erythema consistent with cellulitis extending down the thigh slightly.  No significant calf tenderness or swelling.  Distally neurovascularly intact.  Neurological: She is alert and oriented to person, place, and time.  Skin: Skin is warm and dry.  Psychiatric: She has a normal mood and  affect.     Assessment/Plan Right lower extremity postoperative cellulitis after total hip arthroplasty She will be admitted for observation and a few doses of IV antibiotics.  Dr. Mayer Camel will see her tomorrow and likely transition to oral antibiotics if she is looking better.   Deborah Stalling, MD 12/09/2018, 8:50 PM

## 2018-12-09 NOTE — ED Notes (Signed)
Bed: PR94 Expected date:  Expected time:  Means of arrival:  Comments: 89 F from [place], cellulitis post op

## 2018-12-09 NOTE — ED Triage Notes (Signed)
Pt arrived from [place] due to swelling and pain after a right hip replacement Wednesday. Pt cleared for DVT, pt requested to be transferred here.

## 2018-12-10 ENCOUNTER — Other Ambulatory Visit: Payer: Self-pay

## 2018-12-10 ENCOUNTER — Observation Stay (HOSPITAL_COMMUNITY): Payer: 59

## 2018-12-10 ENCOUNTER — Encounter (HOSPITAL_COMMUNITY): Payer: Self-pay | Admitting: *Deleted

## 2018-12-10 DIAGNOSIS — I10 Essential (primary) hypertension: Secondary | ICD-10-CM | POA: Diagnosis not present

## 2018-12-10 DIAGNOSIS — Z471 Aftercare following joint replacement surgery: Secondary | ICD-10-CM | POA: Diagnosis not present

## 2018-12-10 DIAGNOSIS — Z96641 Presence of right artificial hip joint: Secondary | ICD-10-CM | POA: Diagnosis not present

## 2018-12-10 DIAGNOSIS — K219 Gastro-esophageal reflux disease without esophagitis: Secondary | ICD-10-CM | POA: Diagnosis not present

## 2018-12-10 DIAGNOSIS — L03115 Cellulitis of right lower limb: Secondary | ICD-10-CM | POA: Diagnosis not present

## 2018-12-10 DIAGNOSIS — F329 Major depressive disorder, single episode, unspecified: Secondary | ICD-10-CM | POA: Diagnosis not present

## 2018-12-10 LAB — CBC
HCT: 30.3 % — ABNORMAL LOW (ref 36.0–46.0)
Hemoglobin: 9.8 g/dL — ABNORMAL LOW (ref 12.0–15.0)
MCH: 30.4 pg (ref 26.0–34.0)
MCHC: 32.3 g/dL (ref 30.0–36.0)
MCV: 94.1 fL (ref 80.0–100.0)
NRBC: 0 % (ref 0.0–0.2)
Platelets: 240 10*3/uL (ref 150–400)
RBC: 3.22 MIL/uL — AB (ref 3.87–5.11)
RDW: 12.7 % (ref 11.5–15.5)
WBC: 8.8 10*3/uL (ref 4.0–10.5)

## 2018-12-10 MED ORDER — CEPHALEXIN 500 MG PO CAPS: 500.0000 mg | ORAL_CAPSULE | Freq: Four times a day (QID) | ORAL | 0 refills | Status: AC

## 2018-12-10 MED ORDER — OXYCODONE-ACETAMINOPHEN 5-325 MG PO TABS: 1.0000 | ORAL_TABLET | ORAL | 0 refills | Status: DC | PRN

## 2018-12-10 MED FILL — OXYCODONE-ACETAMINOPHEN 5-3: 5-325 | 2 days supply | Qty: 30 | Fill #0

## 2018-12-10 MED FILL — CEPHALEXIN 500 MG CAPSULE: 500 | 10 days supply | Qty: 40 | Fill #0

## 2018-12-10 NOTE — Progress Notes (Signed)
PATIENT ID: Deborah Goodman  MRN: [id number]  DOB/AGE:  1976-12-08 / 42 y.o.        PROGRESS NOTE Subjective: Patient is alert, oriented, no Nausea, no Vomiting, yes passing gas, . Taking PO well. Denies SOB, Chest or Calf Pain. Using Incentive Spirometer, PAS in place. Ambulate WBAT with walker Patient reports pain as  4/10  In the hip.  She did have an episode of Low back pain while on the transfer from [place] to [place].    Pt is status post right THA on 12/05/18 due to AVN.  She was discharged on Dec 08, 2018 as she had passed her therapy goals.  Objective: Vital signs in last 24 hours: Vitals:   12/09/18 1948 12/09/18 2001 12/09/18 2126 12/09/18 2145  BP:  123/84 (!) 149/92 (!) 136/104  Pulse:  90 92 88  Resp:  17 16 16   Temp:  98.3 F (36.8 C)  98.5 F (36.9 C)  TempSrc:  Oral  Oral  SpO2:  98% 100% 100%  Weight: 83 kg     Height: 5\' 6"  (1.676 m)         Intake/Output from previous day: I/O last 3 completed shifts: In: 320 [P.O.:220; IV Piggyback:100] Out: -    Intake/Output this shift: No intake/output data recorded.   LABORATORY DATA: Recent Labs    12/10/18 0330  WBC 8.8  HGB 9.8*  HCT 30.3*  PLT 240    Examination: Neurologically intact Neurovascular intact Sensation intact distally Intact pulses distally Dorsiflexion/Plantar flexion intact Incision: scant drainage and Pt has an area of drainage ont he bandage that is 30% of the dressing.  it has not changed since the 1st day post op. No cellulitis present Compartment soft} XR AP&Lat of hip shows well placed\fixed THA  Assessment:      ADDITIONAL DIAGNOSIS:  Expected Acute Blood Loss Anemia, Hypertension and anxiety  Plan: PT/OT WBAT, THA  DVT Prophylaxis: SCDx72 hrs, ASA 81 mg BID x 2 weeks  DISCHARGE PLAN: Home possibly later today after another dose of abx.  Will possibly d/c on oral abx.  DISCHARGE NEEDS: HHPT, Walker and 3-in-1 comode seat

## 2018-12-10 NOTE — Discharge Summary (Signed)
Patient ID: PAISLI SILFIES MRN: [id number] DOB/AGE: 1976/05/10 42 y.o.  Admit date: 12/09/2018 Discharge date: 12/10/2018  Admission Diagnoses:  Active Problems:   Cellulitis of leg   Discharge Diagnoses:  Same  Past Medical History:  Diagnosis Date  . Acid reflux   . Avascular necrosis ([place]) 08/2018   Right hip  . Complication of anesthesia    needs glide scope because trachea sit to the side not in the middle after SI surgery did not have a voice  . Depression   . Fatty liver 04/2017   noted on CT Chest  . Fibromyalgia   . GAD (generalized anxiety disorder) 02/10/2017  . Hypertension 02/10/2017  . Lung nodule 04/2017   Stable 4 mm nodule in the periphery of the lateral segment , right, noted on CT Chest  . Palpitations 02/10/2017   a. 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   . Pneumonia 01/2018  . Raynaud's syndrome   . Splenomegaly 04/2017   noted on CT Chest  . SVT (supraventricular tachycardia) ([place])   . Systolic murmur   . Tendinopathy of rotator cuff    Right  . Tobacco use     Surgeries:  on * No surgery found *   Consultants:   Discharged Condition: Improved  Hospital Course: TRINNITY BREUNIG is an 42 y.o. female who was admitted 12/09/2018 for operative treatment of<principal problem not specified>. Patient has severe unremitting pain that affects sleep, daily activities, and work/hobbies. After pre-op clearance the patient was taken to the operating room on * No surgery found * and underwent  .    Patient was given perioperative antibiotics:  Anti-infectives (From admission, onward)   Start     Dose/Rate Route Frequency Ordered Stop   12/10/18 0000  cephALEXin (KEFLEX) 500 MG capsule     500 mg Oral 4 times daily 12/10/18 1136 12/20/18 2359   12/09/18 2200  ceFAZolin (ANCEF) IVPB 1 g/50 mL premix     1 g 100 mL/hr over 30 Minutes Intravenous Every 8 hours 12/09/18 2044 12/10/18 2159       Patient was given sequential  compression devices, early ambulation, and chemoprophylaxis to prevent DVT.  Patient benefited maximally from hospital stay and there were no complications.    Recent vital signs:  Patient Vitals for the past 24 hrs:  BP Temp Temp src Pulse Resp SpO2 Height Weight  12/09/18 2145 (!) 136/104 98.5 F (36.9 C) Oral 88 16 100 % - -  12/09/18 2126 (!) 149/92 - - 92 16 100 % - -  12/09/18 2001 123/84 98.3 F (36.8 C) Oral 90 17 98 % - -  12/09/18 1948 - - - - - - 5\' 6"  (1.676 m) 83 kg     Recent laboratory studies:  Recent Labs    12/10/18 0330  WBC 8.8  HGB 9.8*  HCT 30.3*  PLT 240     Discharge Medications:   Allergies as of 12/10/2018      Reactions   Fish Allergy Nausea And Vomiting   ALL SEAFOOD   Shellfish Allergy Nausea And Vomiting   ALL SEAFOOD   Sulfa Antibiotics Other (See Comments)   Thrush reaction Sores in mouth      Medication List    TAKE these medications   albuterol 108 (90 Base) MCG/ACT inhaler Commonly known as:  PROVENTIL HFA;VENTOLIN HFA Inhale 2 puffs into the lungs every 6 (six) hours as needed for wheezing.   alendronate  70 MG tablet Commonly known as:  FOSAMAX Take 1 tablet (70 mg total) by mouth every 7 (seven) days.   aspirin EC 81 MG tablet Take 1 tablet (81 mg total) by mouth 2 (two) times daily.   cephALEXin 500 MG capsule Commonly known as:  KEFLEX Take 1 capsule (500 mg total) by mouth 4 (four) times daily for 10 days.   gabapentin 300 MG capsule Commonly known as:  NEURONTIN Take 1 capsule (300 mg total) by mouth 3 (three) times daily.   metoprolol succinate 50 MG 24 hr tablet Commonly known as:  TOPROL-XL Take 1 tablet (50 mg total) by mouth daily. Take with or immediately following a meal.   oxyCODONE-acetaminophen 5-325 MG tablet Commonly known as:  PERCOCET/ROXICET Take 1-2 tablets by mouth every 4 (four) hours as needed for severe pain. What changed:  how much to take   sertraline 50 MG tablet Commonly known as:   ZOLOFT Take 1 tablet (50 mg total) by mouth daily.   tiZANidine 2 MG tablet Commonly known as:  ZANAFLEX Take 1 tablet (2 mg total) by mouth every 6 (six) hours as needed. What changed:  reasons to take this   Vitamin D (Ergocalciferol) 1.25 MG (50000 UT) Caps capsule Commonly known as:  DRISDOL Take 1 capsule (50,000 Units total) by mouth every 7 (seven) days. For 12 weeks            Discharge Care Instructions  (From admission, onward)         Start     Ordered   12/10/18 0000  Weight bearing as tolerated     12/10/18 1136          Diagnostic Studies: Dg C-arm 1-60 Min-no Report  Result Date: 12/05/2018 Fluoroscopy was utilized by the requesting physician.  No radiographic interpretation.   Dg Hip Operative Unilat W Or W/o Pelvis Right  Result Date: 12/05/2018 CLINICAL DATA:  42 y/o  F; right hip replacement. EXAM: OPERATIVE RIGHT HIP (WITH PELVIS IF PERFORMED) 3 VIEWS TECHNIQUE: Fluoroscopic spot image(s) were submitted for interpretation post-operatively. COMPARISON:  08/15/2018 pelvis and right hip radiographs. FINDINGS: Intraoperative fluoroscopy of right total hip replacement. Fluoro time is 10 seconds. No acute abnormality identified within the field of view. Postsurgical changes within the soft tissues of the right hip. IMPRESSION: Intraoperative fluoroscopy of right total hip replacement. Fluoro time is 10 seconds. Electronically Signed   By: Kristine Garbe M.D.   On: 12/05/2018 14:59   Dg Hip Unilat With Pelvis 2-3 Views Right  Result Date: 12/10/2018 CLINICAL DATA:  42 year old female status post right total hip arthroplasty EXAM: DG HIP (WITH OR WITHOUT PELVIS) 2-3V RIGHT COMPARISON:  Intraoperative radiograph 12/05/2018 FINDINGS: Surgical changes of right total hip arthroplasty without evidence of hardware complication. No periprosthetic fracture. Additionally, 3 lag screws transfix the right sacroiliac joint. The bony pelvis is intact. IMPRESSION:  Surgical changes of right hip arthroplasty and right sacroiliac arthrodesis without evidence of complication. Electronically Signed   By: Jacqulynn Cadet M.D.   On: 12/10/2018 08:28    Disposition: Discharge disposition: 01-Home or Self Care       Discharge Instructions    Call MD / Call 911   Complete by:  As directed    If you experience chest pain or shortness of breath, CALL 911 and be transported to the hospital emergency room.  If you develope a fever above 101 F, pus (white drainage) or increased drainage or redness at the wound, or calf pain,  call your surgeon's office.   Constipation Prevention   Complete by:  As directed    Drink plenty of fluids.  Prune juice may be helpful.  You may use a stool softener, such as Colace (over the counter) 100 mg twice a day.  Use MiraLax (over the counter) for constipation as needed.   Diet - low sodium heart healthy   Complete by:  As directed    Driving restrictions   Complete by:  As directed    No driving for 2 weeks   Increase activity slowly as tolerated   Complete by:  As directed    Patient may shower   Complete by:  As directed    You may shower without a dressing once there is no drainage.  Do not wash over the wound.  If drainage remains, cover wound with plastic wrap and then shower.   Weight bearing as tolerated   Complete by:  As directed          Signed: Joanell Rising 12/10/2018, 11:37 AM

## 2018-12-10 NOTE — Care Management Note (Signed)
Case Management Note  Patient Details  Name: Deborah Goodman MRN: [id number] Date of Birth: 1975-12-21  Subjective/Objective:                  discharge  Action/Plan: Go home with p.t. through advanced hhc.  Expected Discharge Date:  12/10/18               Expected Discharge Plan:  [place]  In-House Referral:     Discharge planning Services  CM Consult  Post Acute Care Choice:  Home Health Choice offered to:  Patient  DME Arranged:    DME Agency:     HH Arranged:  PT [place]:  [place]  Status of Service:  Completed, signed off  If discussed at [place] of Stay Meetings, dates discussed:    Additional Comments:  Leeroy Cha, RN 12/10/2018, 12:00 PM

## 2018-12-10 NOTE — Progress Notes (Signed)
Rn reviewed discharge instructions with patient and family. All questions answered.   Paperwork and prescriptions given. RN reviewed antibiotic regimen with patient, and prescription was given.   [place] care orders were resumed per CM, and HH was resumed to start for total joint replacement therapy.   NT rolled patient down with all belongings to sisters car.

## 2018-12-11 ENCOUNTER — Other Ambulatory Visit: Payer: Self-pay

## 2018-12-11 DIAGNOSIS — Z720 Tobacco use: Secondary | ICD-10-CM | POA: Diagnosis not present

## 2018-12-11 DIAGNOSIS — Z96641 Presence of right artificial hip joint: Secondary | ICD-10-CM | POA: Diagnosis not present

## 2018-12-11 DIAGNOSIS — I10 Essential (primary) hypertension: Secondary | ICD-10-CM | POA: Diagnosis not present

## 2018-12-11 DIAGNOSIS — Z471 Aftercare following joint replacement surgery: Secondary | ICD-10-CM | POA: Diagnosis not present

## 2018-12-11 DIAGNOSIS — F329 Major depressive disorder, single episode, unspecified: Secondary | ICD-10-CM | POA: Diagnosis not present

## 2018-12-11 NOTE — Patient Outreach (Signed)
[facility]) Care Management  12/11/2018  Deborah Goodman [birth year] [id number]   Telephone call for transition of care call.  Member was hospitalized 12/05/18-12/07/18 for rt total hip arthroplasty and on 12/09/18 to 12/10/18 with cellulitis lt lower extremity. No answer and HIPAA compliant message left. Member returned phone message    Subjective:States she is doing much better since she got home.  States she is trying to move around and the therapist is coming out this afternoon.  States her swelling in her leg is going down and she has been icing it as directed.  States she pain level is around three and she is trying to take less pain medication.  States her bowels have been moving.  States she has her [employer] and short term disability in place.  Objective: S/P rt total hip arthroplasty and cellulitis lt lower extremity.  Assessment: Transition of care call completed Reviewed discharge instructions and when to call MD Reviewed [facility] benefits and how to contact the benefits line if needed   Plan: Plan to close case.  Member has been assessed with no further interventions needed Plan to send successful outreach letter. Deborah Garter RN, Deborah Goodman, CDE Care Management Coordinator [facility] Care Management [phone]

## 2018-12-14 ENCOUNTER — Telehealth: Payer: 59 | Admitting: Family

## 2018-12-14 ENCOUNTER — Encounter: Payer: Self-pay | Admitting: Family Medicine

## 2018-12-14 ENCOUNTER — Emergency Department (HOSPITAL_BASED_OUTPATIENT_CLINIC_OR_DEPARTMENT_OTHER): Payer: 59

## 2018-12-14 ENCOUNTER — Emergency Department (HOSPITAL_BASED_OUTPATIENT_CLINIC_OR_DEPARTMENT_OTHER)
Admission: EM | Admit: 2018-12-14 | Discharge: 2018-12-14 | Disposition: A | Discharge status: Home / Self Care | Payer: 59 | Attending: Emergency Medicine | Admitting: Emergency Medicine

## 2018-12-14 ENCOUNTER — Other Ambulatory Visit: Payer: Self-pay

## 2018-12-14 ENCOUNTER — Encounter (HOSPITAL_BASED_OUTPATIENT_CLINIC_OR_DEPARTMENT_OTHER): Payer: Self-pay | Admitting: *Deleted

## 2018-12-14 DIAGNOSIS — Z79899 Other long term (current) drug therapy: Secondary | ICD-10-CM | POA: Insufficient documentation

## 2018-12-14 DIAGNOSIS — I10 Essential (primary) hypertension: Secondary | ICD-10-CM | POA: Insufficient documentation

## 2018-12-14 DIAGNOSIS — Z96641 Presence of right artificial hip joint: Secondary | ICD-10-CM | POA: Diagnosis not present

## 2018-12-14 DIAGNOSIS — R7989 Other specified abnormal findings of blood chemistry: Secondary | ICD-10-CM

## 2018-12-14 DIAGNOSIS — K5900 Constipation, unspecified: Secondary | ICD-10-CM | POA: Diagnosis not present

## 2018-12-14 DIAGNOSIS — F17210 Nicotine dependence, cigarettes, uncomplicated: Secondary | ICD-10-CM | POA: Diagnosis not present

## 2018-12-14 DIAGNOSIS — R300 Dysuria: Secondary | ICD-10-CM

## 2018-12-14 DIAGNOSIS — R944 Abnormal results of kidney function studies: Secondary | ICD-10-CM | POA: Diagnosis not present

## 2018-12-14 DIAGNOSIS — R109 Unspecified abdominal pain: Secondary | ICD-10-CM | POA: Diagnosis not present

## 2018-12-14 DIAGNOSIS — Z7982 Long term (current) use of aspirin: Secondary | ICD-10-CM | POA: Insufficient documentation

## 2018-12-14 DIAGNOSIS — R350 Frequency of micturition: Secondary | ICD-10-CM | POA: Insufficient documentation

## 2018-12-14 DIAGNOSIS — Z9889 Other specified postprocedural states: Secondary | ICD-10-CM | POA: Insufficient documentation

## 2018-12-14 DIAGNOSIS — Z471 Aftercare following joint replacement surgery: Secondary | ICD-10-CM | POA: Diagnosis not present

## 2018-12-14 LAB — CBC WITH DIFFERENTIAL/PLATELET
Abs Immature Granulocytes: 0.05 10*3/uL (ref 0.00–0.07)
Basophils Absolute: 0 10*3/uL (ref 0.0–0.1)
Basophils Relative: 0 %
Eosinophils Absolute: 0.1 10*3/uL (ref 0.0–0.5)
Eosinophils Relative: 1 %
HCT: 29.5 % — ABNORMAL LOW (ref 36.0–46.0)
Hemoglobin: 9.5 g/dL — ABNORMAL LOW (ref 12.0–15.0)
Immature Granulocytes: 1 %
Lymphocytes Relative: 22 %
Lymphs Abs: 1.5 10*3/uL (ref 0.7–4.0)
MCH: 29.8 pg (ref 26.0–34.0)
MCHC: 32.2 g/dL (ref 30.0–36.0)
MCV: 92.5 fL (ref 80.0–100.0)
Monocytes Absolute: 0.7 10*3/uL (ref 0.1–1.0)
Monocytes Relative: 10 %
Neutro Abs: 4.6 10*3/uL (ref 1.7–7.7)
Neutrophils Relative %: 66 %
Platelets: 247 10*3/uL (ref 150–400)
RBC: 3.19 MIL/uL — ABNORMAL LOW (ref 3.87–5.11)
RDW: 13.2 % (ref 11.5–15.5)
WBC: 6.9 10*3/uL (ref 4.0–10.5)
nRBC: 0 % (ref 0.0–0.2)

## 2018-12-14 LAB — URINALYSIS, ROUTINE W REFLEX MICROSCOPIC
Bilirubin Urine: NEGATIVE
Glucose, UA: NEGATIVE mg/dL
Hgb urine dipstick: NEGATIVE
KETONES UR: NEGATIVE mg/dL
Leukocytes, UA: NEGATIVE
Nitrite: NEGATIVE
Protein, ur: NEGATIVE mg/dL
Specific Gravity, Urine: <1.005 — ABNORMAL LOW (ref 1.005–1.030)
pH: 6 (ref 5.0–8.0)

## 2018-12-14 LAB — BASIC METABOLIC PANEL
Anion gap: 6 (ref 5–15)
BUN: 13 mg/dL (ref 6–20)
CO2: 26 mmol/L (ref 22–32)
CREATININE: 2.05 mg/dL — AB (ref 0.44–1.00)
Calcium: 7.7 mg/dL — ABNORMAL LOW (ref 8.9–10.3)
Chloride: 101 mmol/L (ref 98–111)
GFR calc Af Amer: 34 mL/min — ABNORMAL LOW (ref >60)
GFR calc non Af Amer: 29 mL/min — ABNORMAL LOW (ref >60)
Glucose, Bld: 110 mg/dL — ABNORMAL HIGH (ref 70–99)
Potassium: 3.5 mmol/L (ref 3.5–5.1)
Sodium: 133 mmol/L — ABNORMAL LOW (ref 135–145)

## 2018-12-14 LAB — PREGNANCY, URINE: Preg Test, Ur: NEGATIVE

## 2018-12-14 MED ORDER — SODIUM CHLORIDE 0.9 % IV BOLUS
1000.0000 mL | Freq: Once | INTRAVENOUS | Status: AC
  Administered 2018-12-14: 1000 mL via INTRAVENOUS

## 2018-12-14 MED ORDER — KETOROLAC TROMETHAMINE 30 MG/ML IJ SOLN
30.0000 mg | Freq: Once | INTRAMUSCULAR | Status: AC
  Administered 2018-12-14: 30 mg via INTRAVENOUS
  Filled 2018-12-14: qty 1

## 2018-12-14 NOTE — ED Notes (Signed)
Pt reports lowgrade fevers

## 2018-12-14 NOTE — Progress Notes (Signed)
Thank you for the details you included in the comment boxes. Those details are very helpful in determining the best course of treatment for you and help [place] to provide the best care. With the back pain and fever, this needs exam, urinalysis and culture, given chance of pyelonephritis. UTI should not have associated back pain unless it is migrating upward with renal involvement.   Based on what you shared with me it looks like you have a serious condition that should be evaluated in a face to face office visit.  NOTE: If you entered your credit card information for this eVisit, you will not be charged. You may see a "hold" on your card for the $30 but that hold will drop off and you will not have a charge processed.  If you are having a true medical emergency please call 911.  If you need an urgent face to face visit, [place] has four urgent care centers for your convenience.  If you need care fast and have a high deductible or no insurance consider:   [email or web address] to reserve your spot online an avoid wait times  [facility] [address] , [place] [address] 8 am to 8 pm Monday-Friday 10 am to 4 pm Saturday-Sunday *Across the street from [employer]  [place], [address] 8 am to 5 pm Monday-Friday * In the [facility] on the [facility]   The following sites will take your  insurance:  . [facility] [place] a Provider at this Location  [address] [place], [place] [address] . 10 am to 8 pm Monday-Friday . 12 pm to 8 pm Saturday-Sunday   . [facility] at [place] a Provider at this Location  [address], [place], [place] [address] . 8 am to 8 pm Monday-Friday . 9 am to 6 pm Saturday . 11 am to 6 pm Sunday   . [facility] Care at  [place] Get Driving Directions  [address] [place], [place] [address] . 8 am to 8 pm Monday-Friday . 8 am to 4 pm Saturday-Sunday   Your e-visit answers were reviewed by a board certified advanced clinical practitioner to complete your personal care plan.  Thank you for using e-Visits.

## 2018-12-14 NOTE — ED Triage Notes (Signed)
Pt has hip surgery on 12/18 and had a foley cath at that time.  On 12/23 pt began having urgency and has had intermittent fever up to 101F.  Pt states that she did a e-visit and was told to be evaluated in person.  Pt also has been having lower back pain with this. Pt reports that surgical site appears WNL

## 2018-12-14 NOTE — Discharge Instructions (Signed)
Your work-up today was reassuring that you do not have a kidney infection or urinary tract infection.  Continue to take Tylenol as needed for fevers.  Finish your antibiotic as prescribed.  Take your pain medicines as prescribed but be aware that these may make you drowsy.  Your kidney function was a little bit abnormal but this should improve with drinking water.  Follow-up with your primary care physician or your orthopedist on Tuesday as scheduled and see if they can recheck your kidney function at the time.  Return to the emergency department if any concerning signs or symptoms develop such as high fevers that do not improve with Tylenol, shortness of breath, chest pains, passing out, or incontinence.

## 2018-12-14 NOTE — ED Provider Notes (Signed)
[facility] EMERGENCY DEPARTMENT Provider Note   CSN: [id number] Arrival date & time: 12/14/18  1414     History   Chief Complaint Chief Complaint  Patient presents with  . Urinary Frequency    HPI Deborah Goodman is a 42 y.o. female with history of acid reflux, avascular necrosis, depression, fibromyalgia, generalized anxiety disorder, hypertension, SVT, Raynauds syndrome presents for evaluation of acute onset, progressively worsening urinary symptoms for approximately 4 days.  She also notes intermittent fever up to 101 F.  She endorses urinary urgency, frequency, denies dysuria or hematuria.  She recently underwent right hip arthroplasty by Dr. Mayer Camel on 12/18 and was recently admitted back to the hospital on 12/09/2018 with concern for postoperative infection.  She is currently taking Keflex for this and reports her incision appears well-healing.  Had a Foley catheter placed during her recent admission but this was removed prior to discharge.  She also notes right low back pain overlying the SI joint region and right lower extremity edema but these have been present since the surgery.  She denies abdominal pain, nausea, vomiting, vaginal itching, bleeding, discharge, chest pain, or shortness of breath.  The history is provided by the patient.    Past Medical History:  Diagnosis Date  . Acid reflux   . Avascular necrosis ([place]) 08/2018   Right hip  . Complication of anesthesia    needs glide scope because trachea sit to the side not in the middle after SI surgery did not have a voice  . Depression   . Fatty liver 04/2017   noted on CT Chest  . Fibromyalgia   . GAD (generalized anxiety disorder) 02/10/2017  . Hypertension 02/10/2017  . Lung nodule 04/2017   Stable 4 mm nodule in the periphery of the lateral segment , right, noted on CT Chest  . Palpitations 02/10/2017   a. 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   . Pneumonia 01/2018  .  Raynaud's syndrome   . Splenomegaly 04/2017   noted on CT Chest  . SVT (supraventricular tachycardia) ([place])   . Systolic murmur   . Tendinopathy of rotator cuff    Right  . Tobacco use     Patient Active Problem List   Diagnosis Date Noted  . Cellulitis of leg 12/09/2018  . History of total hip arthroplasty, right 12/05/2018  . Avascular necrosis ([place]) 09/10/2018  . Acute right hip pain 08/15/2018  . 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 Chronic right shoulder pain 07/23/2018  . Morbid obesity ([place]) 12/25/2017  . Family history of autoimmune disorder 12/18/2017  . Transaminitis 05/05/2017  . Lung nodule < 6cm on CT 05/05/2017  . Fever 04/27/2017  . PSVT (paroxysmal supraventricular tachycardia) ([place]) 03/13/2017  . Tobacco use 03/13/2017  . Palpitations 02/10/2017  . GAD (generalized anxiety disorder) 02/10/2017  . Hypertension 02/10/2017  . Abnormal thyroid blood test 02/10/2017    Past Surgical History:  Procedure Laterality Date  . SACRO-ILIAC PINNING  2016   fusion, in [place]  . TONSILLECTOMY     age 15  . TOTAL HIP ARTHROPLASTY Right 12/05/2018   Procedure: TOTAL HIP ARTHROPLASTY ANTERIOR APPROACH;  Surgeon: Frederik Pear, MD;  Location: [facility] ORS;  Service: Orthopedics;  Laterality: Right;     OB History    Gravida  0   Para  0   Term  0   Preterm  0   AB  0   Living  0  SAB  0   TAB  0   Ectopic  0   Multiple  0   Live Births  0            Home Medications    Prior to Admission medications   Medication Sig Start Date End Date Taking? Authorizing Provider  albuterol (PROVENTIL HFA;VENTOLIN HFA) 108 (90 Base) MCG/ACT inhaler Inhale 2 puffs into the lungs every 6 (six) hours as needed for wheezing. 08/08/17   Emeterio Reeve, DO  alendronate (FOSAMAX) 70 MG tablet Take 1 tablet (70 mg total) by mouth every 7 (seven) days. 11/30/18   Gregor Hams, MD  aspirin EC 81 MG tablet Take 1 tablet (81 mg total) by mouth 2 (two)  times daily. 12/05/18   Leighton Parody, PA-C  cephALEXin (KEFLEX) 500 MG capsule Take 1 capsule (500 mg total) by mouth 4 (four) times daily for 10 days. 12/10/18 12/20/18  Leighton Parody, PA-C  gabapentin (NEURONTIN) 300 MG capsule Take 1 capsule (300 mg total) by mouth 3 (three) times daily. 12/05/18 12/05/19  Leighton Parody, PA-C  metoprolol succinate (TOPROL-XL) 50 MG 24 hr tablet Take 1 tablet (50 mg total) by mouth daily. Take with or immediately following a meal. 11/30/18   Gregor Hams, MD  oxyCODONE-acetaminophen (PERCOCET/ROXICET) 5-325 MG tablet Take 1-2 tablets by mouth every 4 (four) hours as needed for severe pain. 12/10/18   Leighton Parody, PA-C  sertraline (ZOLOFT) 50 MG tablet Take 1 tablet (50 mg total) by mouth daily. 11/30/18   Gregor Hams, MD  tiZANidine (ZANAFLEX) 2 MG tablet Take 1 tablet (2 mg total) by mouth every 6 (six) hours as needed. Patient taking differently: Take 2 mg by mouth every 6 (six) hours as needed for muscle spasms.  12/05/18   Leighton Parody, PA-C  Vitamin D, Ergocalciferol, (DRISDOL) 1.25 MG (50000 UT) CAPS capsule Take 1 capsule (50,000 Units total) by mouth every 7 (seven) days. For 12 weeks 10/31/18   Gregor Hams, MD    Family History Family History  Problem Relation Age of Onset  . Depression Mother   . Hypertension Mother   . Cancer Mother        colon  . Diabetes Father   . Arrhythmia Sister        palpitations  . Cancer Maternal Grandfather        COLON  . Diabetes Paternal Grandfather     Social History Social History   Tobacco Use  . Smoking status: Current Every Day Smoker    Packs/day: 0.50    Years: 22.00    Pack years: 11.00    Types: Cigarettes, E-cigarettes  . Smokeless tobacco: Never Used  Substance Use Topics  . Alcohol use: Yes  . Drug use: No     Allergies   Fish allergy; Shellfish allergy; and Sulfa antibiotics   Review of Systems Review of Systems  Constitutional: Positive for fever. Negative  for chills.  Respiratory: Negative for shortness of breath.   Cardiovascular: Positive for leg swelling. Negative for chest pain.  Gastrointestinal: Positive for constipation. Negative for abdominal pain, diarrhea, nausea and vomiting.  Genitourinary: Positive for flank pain, frequency and urgency. Negative for dysuria.  All other systems reviewed and are negative.    Physical Exam Updated Vital Signs BP (!) 106/52 (BP Location: Right Arm)   Pulse 63   Temp 98.9 F (37.2 C) (Oral)   Resp 16   Ht 5\' 6"  (1.676 m)  Wt 82.6 kg   LMP 12/01/2018 (Exact Date)   SpO2 100%   BMI 29.38 kg/m   Physical Exam Vitals signs and nursing note reviewed.  Constitutional:      General: She is not in acute distress.    Appearance: She is well-developed.  HENT:     Head: Normocephalic and atraumatic.  Eyes:     General:        Right eye: No discharge.        Left eye: No discharge.     Conjunctiva/sclera: Conjunctivae normal.  Neck:     Vascular: No JVD.     Trachea: No tracheal deviation.  Cardiovascular:     Rate and Rhythm: Normal rate.     Pulses: Normal pulses.     Heart sounds: Normal heart sounds.  Pulmonary:     Effort: Pulmonary effort is normal.     Breath sounds: Normal breath sounds.  Abdominal:     General: Abdomen is flat. There is no distension.     Tenderness: There is no abdominal tenderness. There is no right CVA tenderness, left CVA tenderness or guarding.  Musculoskeletal:     Right lower leg: Edema present.     Left lower leg: No edema.     Comments: No midline spine TTP, no paraspinal muscle tenderness, no deformity, crepitus, or step-off noted.  Right SI joint tenderness noted.  4/5 strength of right hip flexor, otherwise 5/5 strength of BLE major muscle groups.  Patient states this is been present since her surgery on 12/18.  2+ nonpitting edema of the right lower extremity which also has been present since the surgery.  Skin:    General: Skin is warm and dry.       Findings: No erythema.     Comments: Well-healing surgical incision overlying the right hip.  Some subtle surrounding erythema, the edges of which has been marked with a skin pen.  No drainage.  Neurological:     Mental Status: She is alert.     Comments: Fluent speech, no facial droop, sensation intact to soft touch of bilateral lower extremities.  Psychiatric:        Behavior: Behavior normal.      ED Treatments / Results  Labs (all labs ordered are listed, but only abnormal results are displayed) Labs Reviewed  URINALYSIS, ROUTINE W REFLEX MICROSCOPIC - Abnormal; Notable for the following components:      Result Value   Specific Gravity, Urine <1.005 (*)    All other components within normal limits  CBC WITH DIFFERENTIAL/PLATELET - Abnormal; Notable for the following components:   RBC 3.19 (*)    Hemoglobin 9.5 (*)    HCT 29.5 (*)    All other components within normal limits  BASIC METABOLIC PANEL - Abnormal; Notable for the following components:   Sodium 133 (*)    Glucose, Bld 110 (*)    Creatinine, Ser 2.05 (*)    Calcium 7.7 (*)    GFR calc non Af Amer 29 (*)    GFR calc Af Amer 34 (*)    All other components within normal limits  URINE CULTURE  PREGNANCY, URINE    EKG None  Radiology Ct Renal Stone Study  Result Date: 12/14/2018 CLINICAL DATA:  Flank pain, urinary urgency over the last 4 days, recent hip replacement on December 17. EXAM: CT ABDOMEN AND PELVIS WITHOUT CONTRAST TECHNIQUE: Multidetector CT imaging of the abdomen and pelvis was performed following the standard protocol without IV  contrast. COMPARISON:  None. FINDINGS: Lower chest: The lung bases are clear. The heart is mildly enlarged. Hepatobiliary: The liver appears somewhat low in attenuation which may indicate mild hepatic steatosis. Correlate with LFTs is recommended. No calcified gallstones are seen. Pancreas: The pancreas is normal in size and the pancreatic duct is not dilated. Spleen: The  spleen is unremarkable. Adrenals/Urinary Tract: The adrenal glands appear normal. No renal calculi are seen and on this unenhanced study, no mass or hydronephrosis is evident. The ureters appear normal in caliber. The urinary bladder is decompressed and can not be well evaluated. Stomach/Bowel: The stomach is completely decompressed. No small bowel dilatation or edema is seen. There is some feces throughout the colon and there are scattered colonic diverticula primarily in the sigmoid colon. The terminal ileum is unremarkable as is the appendix. Vascular/Lymphatic: The abdominal aorta is normal in caliber. No adenopathy is seen. Reproductive: The uterus is normal in size. No adnexal lesion is seen. No fluid is noted within the pelvis. Other: No abdominal wall hernia is seen. There is some strandiness in the soft tissues anterior to the right hip presumably due to the recent right hip replacement. Musculoskeletal: Components of the right hip replacement appear to be in good position. There are changes of avascular necrosis involving the head of the left femur with mild degenerative change present within the left hip. Screws are present for fixation of the right SI joint. The lumbar vertebrae are normal alignment with normal intervertebral disc spaces. IMPRESSION: 1. No explanation for the patient's symptoms is seen. No renal or ureteral calculi are noted. Pyelonephritis can not be evaluated on and unenhanced study. 1. No probable avascular necrosis involving the head of the left femur. 2. Right hip replacement components appear to be in good position with no complicating features evident. 3. Suspicion of mild hepatic steatosis.  Correlate with LFTs. Electronically Signed   By: Ivar Drape M.D.   On: 12/14/2018 17:11    Procedures Procedures (including critical care time)  Medications Ordered in ED Medications  ketorolac (TORADOL) 30 MG/ML injection 30 mg (30 mg Intravenous Given 12/14/18 1544)  sodium  chloride 0.9 % bolus 1,000 mL (0 mLs Intravenous Stopped 12/14/18 1758)     Initial Impression / Assessment and Plan / ED Course  I have reviewed the triage vital signs and the nursing notes.  Pertinent labs & imaging results that were available during my care of the patient were reviewed by me and considered in my medical decision making (see chart for details).    Patient presenting with complaint of urinary urgency and frequency.  She is 9 days postop right total hip arthroplasty.  Recent admission for postop infection, currently taking Keflex.  Her incision appears well healing and the surrounding cellulitis is resolving.  She is neurovascularly intact.  Low back pain and right lower extremity edema has been present and ongoing since the surgery but unchanged.  No red flag signs concerning for cauda equina or spinal abscess.  UA does not suggest UTI or nephrolithiasis.  Renal stone study shows no evidence of renal abscess, pyelonephritis, nephrolithiasis.  Abdomen is soft and nontender, doubt acute surgical abdominal pathology.  Lab work is significant for elevation in her creatinine as compared to lab work from 8 days ago.  BUN within normal limits.  She was given IV fluids in the ED.  On reevaluation she is resting comfortably no apparent distress.  I do not feel that she requires any additional antibiotics as her  UA does not suggest UTI and her only urinary symptoms are frequency and urgency.  She has follow-up with her orthopedist in 4 days.  Recommend follow-up as scheduled with repeat creatinine at that time.  Encouraged fluids at home.  Discussed strict ED return precautions. Pt verbalized understanding of and agreement with plan and is safe for discharge home at this time.  Discussed with Dr. Wilson Singer who agrees with assessment and plan at this time.  Final Clinical Impressions(s) / ED Diagnoses   Final diagnoses:  Elevated serum creatinine    ED Discharge Orders    None       Debroah Baller 12/15/18 0035    Virgel Manifold, MD 12/15/18 [phone]

## 2018-12-15 LAB — URINE CULTURE: Culture: NO GROWTH

## 2018-12-18 DIAGNOSIS — F329 Major depressive disorder, single episode, unspecified: Secondary | ICD-10-CM | POA: Diagnosis not present

## 2018-12-18 DIAGNOSIS — Z471 Aftercare following joint replacement surgery: Secondary | ICD-10-CM | POA: Diagnosis not present

## 2018-12-18 DIAGNOSIS — I10 Essential (primary) hypertension: Secondary | ICD-10-CM | POA: Diagnosis not present

## 2018-12-18 DIAGNOSIS — M25551 Pain in right hip: Secondary | ICD-10-CM | POA: Diagnosis not present

## 2018-12-18 DIAGNOSIS — Z96641 Presence of right artificial hip joint: Secondary | ICD-10-CM | POA: Diagnosis not present

## 2018-12-18 DIAGNOSIS — Z720 Tobacco use: Secondary | ICD-10-CM | POA: Diagnosis not present

## 2018-12-26 ENCOUNTER — Ambulatory Visit: Payer: 59 | Admitting: Rehabilitative and Restorative Service Providers"

## 2018-12-27 ENCOUNTER — Ambulatory Visit (INDEPENDENT_AMBULATORY_CARE_PROVIDER_SITE_OTHER): Payer: 59 | Admitting: Rehabilitative and Restorative Service Providers"

## 2018-12-27 ENCOUNTER — Encounter: Payer: Self-pay | Admitting: Rehabilitative and Restorative Service Providers"

## 2018-12-27 ENCOUNTER — Other Ambulatory Visit: Payer: Self-pay

## 2018-12-27 DIAGNOSIS — R29898 Other symptoms and signs involving the musculoskeletal system: Secondary | ICD-10-CM

## 2018-12-27 DIAGNOSIS — M25551 Pain in right hip: Secondary | ICD-10-CM

## 2018-12-27 DIAGNOSIS — R2689 Other abnormalities of gait and mobility: Secondary | ICD-10-CM

## 2018-12-27 DIAGNOSIS — R293 Abnormal posture: Secondary | ICD-10-CM

## 2018-12-27 NOTE — Patient Instructions (Addendum)
Start trying to lie with Rt leg out straight - can have left knee and hip bent  Quads, Standing Stand facing the wall or counter - keep hips forward; bring lt leg back to stretch across the front of the left thigh and hip  Hold 30 sec x 3 reps 4-5 times a day   Standing - work on straightening fully - with weight on both legs equally  Work on standing during commercial breaks of 30 min tv show - at least 2 times/day   Standing - shift weight side to side for 20 - 30 reps  Place one foot forward and shift weight forward and back 20-30 reps then switch legs   Work on getting hip and back straight!!
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
## 2018-12-28 ENCOUNTER — Encounter: Payer: Self-pay | Admitting: Rehabilitative and Restorative Service Providers"

## 2018-12-28 ENCOUNTER — Ambulatory Visit (INDEPENDENT_AMBULATORY_CARE_PROVIDER_SITE_OTHER): Payer: 59 | Admitting: Rehabilitative and Restorative Service Providers"

## 2018-12-28 DIAGNOSIS — M25551 Pain in right hip: Secondary | ICD-10-CM | POA: Diagnosis not present

## 2018-12-28 DIAGNOSIS — R29898 Other symptoms and signs involving the musculoskeletal system: Secondary | ICD-10-CM

## 2018-12-28 DIAGNOSIS — R293 Abnormal posture: Secondary | ICD-10-CM | POA: Diagnosis not present

## 2018-12-28 DIAGNOSIS — R2689 Other abnormalities of gait and mobility: Secondary | ICD-10-CM

## 2018-12-28 DIAGNOSIS — M25511 Pain in right shoulder: Secondary | ICD-10-CM | POA: Diagnosis not present

## 2018-12-28 NOTE — Patient Instructions (Addendum)
  Stairs - lead up with the Lt and down with the Rt - railing on the Lt side   Standing at kitchen counter - side steps left to right then right to left  Keep foot straight   Walking with walker - focus on core tight; equal length steps slow walking with equal amount of time on each leg  2-3 min working toward 5 min 2-3 times a day

## 2018-12-28 NOTE — Therapy (Signed)
[facility] [place]  [place], [place], [address] Phone: [phone]   Fax:  [phone]  Physical Therapy Treatment  Patient Details  Name: Deborah Goodman MRN: [id number] Date of Birth: 01-Jun-1976 Referring Provider (PT): Dr Frederik Pear    Encounter Date: 12/28/2018  PT End of Session - 12/28/18 1343    Visit Number  2    Number of Visits  12    Date for PT Re-Evaluation  02/07/19    PT Start Time  1339    PT Stop Time  1412    PT Time Calculation (min)  33 min    Activity Tolerance  Patient tolerated treatment well       Past Medical History:  Diagnosis Date  . Acid reflux   . Avascular necrosis ([place]) 08/2018   Right hip  . Complication of anesthesia    needs glide scope because trachea sit to the side not in the middle after SI surgery did not have a voice  . Depression   . Fatty liver 04/2017   noted on CT Chest  . Fibromyalgia   . GAD (generalized anxiety disorder) 02/10/2017  . Hypertension 02/10/2017  . Lung nodule 04/2017   Stable 4 mm nodule in the periphery of the lateral segment , right, noted on CT Chest  . Palpitations 02/10/2017   a. 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   . Pneumonia 01/2018  . Raynaud's syndrome   . Splenomegaly 04/2017   noted on CT Chest  . SVT (supraventricular tachycardia) ([place])   . Systolic murmur   . Tendinopathy of rotator cuff    Right  . Tobacco use     Past Surgical History:  Procedure Laterality Date  . SACRO-ILIAC PINNING  2016   fusion, in [place]  . TONSILLECTOMY     age 43  . TOTAL HIP ARTHROPLASTY Right 12/05/2018   Procedure: TOTAL HIP ARTHROPLASTY ANTERIOR APPROACH;  Surgeon: Frederik Pear, MD;  Location: [facility];  Service: Orthopedics;  Laterality: Right;    There were no vitals filed for this visit.  Subjective Assessment - 12/28/18 1346    Subjective  Patient reports that she is somewhat sore today - clothing  rubbing along the incision line. She went grocery shopping arter therapy yesterday. She was "really hurting" after getting home and putting her grocery items away     Currently in Pain?  Yes    Pain Score  2     Pain Location  Hip    Pain Orientation  Right    Pain Descriptors / Indicators  Dull                       [facility] Adult PT Treatment/Exercise - 12/28/18 0001      Ambulation/Gait   Stairs  --   worked on ascending and descending 4" steps rail Lt x 4    Gait Comments  worked on gait pattern with rolling walker - equal length steps and equal wt bearing and wt shift - keeping Rt hip forward with giat sequence       Knee/Hip Exercises: Stretches   Hip Flexor Stretch  Right;3 reps;30 seconds   seated      Knee/Hip Exercises: Standing   Hip Abduction  AROM;Right;Left;2 sets;10 reps;Knee straight   leading with heel    Hip Extension  AROM;Right;Left;2 sets;10 reps;Knee straight    Functional Squat  2 sets;10 reps  Other Standing Knee Exercises  weight bearing with wt equal on both sides; weight shift side to side x 20-30 reps then stagger step shifting wt fwd and back 20-30 reps each foot forward     Other Standing Knee Exercises  side steps at counter 12 ft x 3 each direction; standing on 4 in step Lt foot swinging Rt LE into gentle hip flexion and extension x 30-40 swings;       Modalities   Modalities  --   declined ice will do at home             PT Education - 12/28/18 1353    Education Details  HEP     Person(s) Educated  Patient    Methods  Explanation;Demonstration;Tactile cues;Verbal cues;Handout    Comprehension  Verbalized understanding;Returned demonstration;Verbal cues required;Tactile cues required          PT Long Term Goals - 12/27/18 1304      PT LONG TERM GOAL #1   Title  Improve posture and alignment with patient to demonstrate improved upright posture with trunk and hip in neutral posture in standing and lying supine 02/07/2019     Time  6    Period  Weeks    Status  New      PT LONG TERM GOAL #2   Title  Increase A/PROM Rt hip to neutral to improve control/movement for transfers and gait 02/07/2019    Time  6    Period  Weeks    Status  New      PT LONG TERM GOAL #3   Title  Decrease pain by 50-75% allowing patient to perform functional and prepare for return to work activities with minimal difficulty 02/07/2019    Time  6    Period  Weeks    Status  New      PT LONG TERM GOAL #4   Title  Independent in HEP 02/07/2019    Time  6    Period  Weeks    Status  New      PT LONG TERM GOAL #5   Title  Improve FOTO to 32% limitation 02/07/2019    Time  6    Period  Weeks    Status  New            Plan - 12/28/18 1346    Clinical Impression Statement  Patient did not want to try lying prone; or work through the anterior quad or hip today. Reviewed exercises and added seated hip flexor stretch to pt tolerance and standing exercise with focus on gentle extension of Rt hip. Worked on gait with rolling walker with gait pattern and wt shift.  continued education re positions for rest and sleep - patient will work on straightening Rt LE in supine for 20-30 sec 3-5 reps 2-3 times per day. Worked on ascending and descending stairs in clinic 4 in step railing on Lt (to proctice type steps to enter her home).     Rehab Potential  Good    PT Frequency  2x / week    PT Duration  6 weeks    PT Treatment/Interventions  Patient/family education;ADLs/Self Care Home Management;Cryotherapy;Electrical Stimulation;Iontophoresis 4mg /ml Dexamethasone;Moist Heat;Ultrasound;Dry needling;Balance training;Gait training;Neuromuscular re-education;Functional mobility training;Therapeutic activities;Therapeutic exercise    PT Next Visit Plan  review HEP; begin gentle stretching and movement to improve tissue extensibility; transfer training; gait training - work to get patient in neutral trunk and hip extension      Consulted  and Agree  with Plan of Care  Patient       Patient will benefit from skilled therapeutic intervention in order to improve the following deficits and impairments:  Postural dysfunction, Improper body mechanics, Pain, Increased fascial restricitons, Increased muscle spasms, Decreased mobility, Decreased range of motion, Decreased strength, Decreased activity tolerance, Abnormal gait  Visit Diagnosis: Pain of right hip joint  Other symptoms and signs involving the musculoskeletal system  Abnormal posture  Other abnormalities of gait and mobility  Acute pain of right shoulder     Problem List Patient Active Problem List   Diagnosis Date Noted  . Cellulitis of leg 12/09/2018  . History of total hip arthroplasty, right 12/05/2018  . Avascular necrosis ([place]) 09/10/2018  . Acute right hip pain 08/15/2018  . 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 Chronic right shoulder pain 07/23/2018  . Morbid obesity ([place]) 12/25/2017  . Family history of autoimmune disorder 12/18/2017  . Transaminitis 05/05/2017  . Lung nodule < 6cm on CT 05/05/2017  . Fever 04/27/2017  . PSVT (paroxysmal supraventricular tachycardia) ([place]) 03/13/2017  . Tobacco use 03/13/2017  . Palpitations 02/10/2017  . GAD (generalized anxiety disorder) 02/10/2017  . Hypertension 02/10/2017  . Abnormal thyroid blood test 02/10/2017    Keniya Schlotterbeck Nilda Simmer PT, MPH  12/28/2018, 2:44 PM  [facility] [place], [place], [address] Phone: [phone]   Fax:  [phone]  Name: Deborah Goodman MRN: [id number] Date of Birth: 06-08-1976

## 2018-12-31 ENCOUNTER — Ambulatory Visit (INDEPENDENT_AMBULATORY_CARE_PROVIDER_SITE_OTHER): Payer: 59 | Admitting: Rehabilitative and Restorative Service Providers"

## 2018-12-31 ENCOUNTER — Encounter: Payer: Self-pay | Admitting: Rehabilitative and Restorative Service Providers"

## 2018-12-31 DIAGNOSIS — M25551 Pain in right hip: Secondary | ICD-10-CM | POA: Diagnosis not present

## 2018-12-31 DIAGNOSIS — M25511 Pain in right shoulder: Secondary | ICD-10-CM

## 2018-12-31 DIAGNOSIS — R293 Abnormal posture: Secondary | ICD-10-CM

## 2018-12-31 DIAGNOSIS — R29898 Other symptoms and signs involving the musculoskeletal system: Secondary | ICD-10-CM | POA: Diagnosis not present

## 2018-12-31 DIAGNOSIS — R2689 Other abnormalities of gait and mobility: Secondary | ICD-10-CM | POA: Diagnosis not present

## 2018-12-31 NOTE — Patient Instructions (Signed)
Access Code: [email or web address]  URL: [email or web address]  Date: 12/31/2018  Prepared by: Gillermo Murdoch   Exercises  Standing Quad Stretch with Strap - 3 reps - 1 sets - 2x daily - 7x weekly  Gastroc Stretch on Wall - 3 reps - 1 sets - 2x daily - 7x weekly

## 2018-12-31 NOTE — Therapy (Signed)
[place], [place], [address] Phone: [phone]   Fax:  [phone]  Physical Therapy Treatment  Patient Details  Name: Deborah Goodman MRN: [id number] Date of Birth: Aug 29, 1976 Referring Provider (PT): Dr Frederik Pear    Encounter Date: 12/31/2018  PT End of Session - 12/31/18 1429    Visit Number  3    Number of Visits  12    Date for PT Re-Evaluation  02/07/19    PT Start Time  7169    PT Stop Time  1515    PT Time Calculation (min)  47 min    Activity Tolerance  Patient tolerated treatment well       Past Medical History:  Diagnosis Date  . Acid reflux   . Avascular necrosis ([place]) 08/2018   Right hip  . Complication of anesthesia    needs glide scope because trachea sit to the side not in the middle after SI surgery did not have a voice  . Depression   . Fatty liver 04/2017   noted on CT Chest  . Fibromyalgia   . GAD (generalized anxiety disorder) 02/10/2017  . Hypertension 02/10/2017  . Lung nodule 04/2017   Stable 4 mm nodule in the periphery of the lateral segment , right, noted on CT Chest  . Palpitations 02/10/2017   a. 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   . Pneumonia 01/2018  . Raynaud's syndrome   . Splenomegaly 04/2017   noted on CT Chest  . SVT (supraventricular tachycardia) ([place])   . Systolic murmur   . Tendinopathy of rotator cuff    Right  . Tobacco use     Past Surgical History:  Procedure Laterality Date  . SACRO-ILIAC PINNING  2016   fusion, in [place]  . TONSILLECTOMY     age 43  . TOTAL HIP ARTHROPLASTY Right 12/05/2018   Procedure: TOTAL HIP ARTHROPLASTY ANTERIOR APPROACH;  Surgeon: Frederik Pear, MD;  Location: [facility];  Service: Orthopedics;  Laterality: Right;    There were no vitals filed for this visit.  Subjective Assessment - 12/31/18 1433    Subjective  Patient reports that she did some exercises over the weekend. She  is still bandaging the incision. She is trying to slow down some with the gait. She has a TENS unit but has not tried it on the hip area.     Currently in Pain?  Yes    Pain Score  2     Pain Location  Hip    Pain Orientation  Right    Pain Descriptors / Indicators  Dull;Aching    Pain Type  Surgical pain;Chronic pain    Pain Onset  More than a month ago    Pain Frequency  Intermittent    Aggravating Factors   prolonged lying, sitting, standing     Pain Relieving Factors  moving to change positions          [facility] PT Assessment - 12/31/18 0001      Palpation   Palpation comment  tightness and tenderness through the anterior lateral Rt thigh and hip       Ambulation/Gait   Gait Comments  some improvement in gait pattern with increased wt bearing Rt LE with stance on Rt - continued trunk shift to the Rt with wt bearing on Rt but this has decreased some  [facility] Adult PT Treatment/Exercise - 12/31/18 0001      Knee/Hip Exercises: Stretches   Quad Stretch  Right;30 seconds;3 reps   standing with strap    Gastroc Stretch  Right;Left;3 reps;30 seconds      Knee/Hip Exercises: Aerobic   Nustep  L5 x 8 min slowly - using UE's at 10       Knee/Hip Exercises: Standing   Hip Abduction  AROM;Right;Left;2 sets;10 reps;Knee straight   leading with heel    Hip Extension  AROM;Right;Left;2 sets;10 reps;Knee straight    Other Standing Knee Exercises  weight bearing with wt equal on both sides; weight shift side to side x 20-30 reps then stagger step shifting wt fwd and back 20-30 reps each foot forward     Other Standing Knee Exercises  side steps at counter 12 ft x 3 each direction; standing on 4 in step Lt foot swinging Rt LE into gentle hip flexion and extension x 30-40 swings;       Knee/Hip Exercises: Seated   Sit to Sand  2 sets;5 reps;with UE support   UE support as needed      Moist Heat Therapy   Number Minutes Moist Heat  12 Minutes    Moist Heat  Location  Hip   heat postRt hip/thigh during manual work ant/lat Rt thigh     Manual Therapy   Manual therapy comments  pt positioned in semireclining position with slight flexion of bilat knees using table to place pt in position of comfort     Soft tissue mobilization  soft tissue work to pt tolerance Rt anteriorlateral thigh and hip - education re scar masssage and self massage for home     Myofascial Release  Rt anteriorlateral thigh              PT Education - 12/31/18 1638    Education Details  HEP     Person(s) Educated  Patient    Methods  Explanation;Demonstration;Tactile cues;Verbal cues;Handout    Comprehension  Verbalized understanding;Returned demonstration;Verbal cues required;Tactile cues required          PT Long Term Goals - 12/27/18 1304      PT LONG TERM GOAL #1   Title  Improve posture and alignment with patient to demonstrate improved upright posture with trunk and hip in neutral posture in standing and lying supine 02/07/2019    Time  6    Period  Weeks    Status  New      PT LONG TERM GOAL #2   Title  Increase A/PROM Rt hip to neutral to improve control/movement for transfers and gait 02/07/2019    Time  6    Period  Weeks    Status  New      PT LONG TERM GOAL #3   Title  Decrease pain by 50-75% allowing patient to perform functional and prepare for return to work activities with minimal difficulty 02/07/2019    Time  6    Period  Weeks    Status  New      PT LONG TERM GOAL #4   Title  Independent in HEP 02/07/2019    Time  6    Period  Weeks    Status  New      PT LONG TERM GOAL #5   Title  Improve FOTO to 32% limitation 02/07/2019    Time  6    Period  Weeks    Status  New  Plan - 12/31/18 1430    Clinical Impression Statement  Patient reports continued pain in the Rt hip - she is fearful of some of the exercises. She does not want the hip to "dislocate". Education for pt re- security of hip with all exercises and  activities in the clinic. Patient added gentle stretching in standing and conitnued to work on exercise in the clinic, Self imits exercise and ROM Rt hip. Tolerated gentle to medium pressure for STM Rt anterior/lateral thigh in semireclined position with LE's slightly flexed at knees using table to position into comfort. Patient needs continued encouragement to work on stretching and ROM to progress with hip rehab     Rehab Potential  Good    PT Frequency  2x / week    PT Duration  6 weeks    PT Treatment/Interventions  Patient/family education;ADLs/Self Care Home Management;Cryotherapy;Electrical Stimulation;Iontophoresis 4mg /ml Dexamethasone;Moist Heat;Ultrasound;Dry needling;Balance training;Gait training;Neuromuscular re-education;Functional mobility training;Therapeutic activities;Therapeutic exercise    PT Next Visit Plan  review HEP; begin gentle stretching and movement to improve tissue extensibility; transfer training; gait training - work to get patient in neutral trunk and hip extension      PT Home Exercise Plan   Access Code: [email or web address]     Consulted and Agree with Plan of Care  Patient       Patient will benefit from skilled therapeutic intervention in order to improve the following deficits and impairments:  Postural dysfunction, Improper body mechanics, Pain, Increased fascial restricitons, Increased muscle spasms, Decreased mobility, Decreased range of motion, Decreased strength, Decreased activity tolerance, Abnormal gait  Visit Diagnosis: Pain of right hip joint  Other symptoms and signs involving the musculoskeletal system  Abnormal posture  Other abnormalities of gait and mobility  Acute pain of right shoulder     Problem List Patient Active Problem List   Diagnosis Date Noted  . Cellulitis of leg 12/09/2018  . History of total hip arthroplasty, right 12/05/2018  . Avascular necrosis ([place]) 09/10/2018  . Acute right hip pain 08/15/2018  . Status post right  sacroiliac joint fusion 08/15/2018  . Chronic right shoulder pain 07/23/2018  . Morbid obesity ([place]) 12/25/2017  . Family history of autoimmune disorder 12/18/2017  . Transaminitis 05/05/2017  . Lung nodule < 6cm on CT 05/05/2017  . Fever 04/27/2017  . PSVT (paroxysmal supraventricular tachycardia) ([place]) 03/13/2017  . Tobacco use 03/13/2017  . Palpitations 02/10/2017  . GAD (generalized anxiety disorder) 02/10/2017  . Hypertension 02/10/2017  . Abnormal thyroid blood test 02/10/2017    Taite Schoeppner Nilda Simmer PT, MPH  12/31/2018, 4:46 PM  [facility] [place], [place], [address] Phone: [phone]   Fax:  [phone]  Name: TIARRAH SAVILLE MRN: [id number] Date of Birth: 03/21/1976

## 2019-01-02 ENCOUNTER — Encounter: Payer: Self-pay | Admitting: Rehabilitative and Restorative Service Providers"

## 2019-01-02 ENCOUNTER — Ambulatory Visit (INDEPENDENT_AMBULATORY_CARE_PROVIDER_SITE_OTHER): Payer: 59 | Admitting: Rehabilitative and Restorative Service Providers"

## 2019-01-02 DIAGNOSIS — R29898 Other symptoms and signs involving the musculoskeletal system: Secondary | ICD-10-CM

## 2019-01-02 DIAGNOSIS — M25511 Pain in right shoulder: Secondary | ICD-10-CM | POA: Diagnosis not present

## 2019-01-02 DIAGNOSIS — R2689 Other abnormalities of gait and mobility: Secondary | ICD-10-CM

## 2019-01-02 DIAGNOSIS — R293 Abnormal posture: Secondary | ICD-10-CM | POA: Diagnosis not present

## 2019-01-02 DIAGNOSIS — M25551 Pain in right hip: Secondary | ICD-10-CM | POA: Diagnosis not present

## 2019-01-02 DIAGNOSIS — Z96641 Presence of right artificial hip joint: Secondary | ICD-10-CM | POA: Diagnosis not present

## 2019-01-02 NOTE — Therapy (Signed)
[place], [place], [address] Phone: [phone]   Fax:  [phone]  Physical Therapy Treatment  Patient Details  Name: Deborah Goodman MRN: [id number] Date of Birth: Nov 21, 1976 Referring Provider (PT): Dr Frederik Pear    Encounter Date: 01/02/2019  PT End of Session - 01/02/19 1551    Visit Number  4    Number of Visits  12    Date for PT Re-Evaluation  02/07/19    PT Start Time  1508    PT Stop Time  1553    PT Time Calculation (min)  45 min    Activity Tolerance  Patient tolerated treatment well       Past Medical History:  Diagnosis Date  . Acid reflux   . Avascular necrosis ([place]) 08/2018   Right hip  . Complication of anesthesia    needs glide scope because trachea sit to the side not in the middle after SI surgery did not have a voice  . Depression   . Fatty liver 04/2017   noted on CT Chest  . Fibromyalgia   . GAD (generalized anxiety disorder) 02/10/2017  . Hypertension 02/10/2017  . Lung nodule 04/2017   Stable 4 mm nodule in the periphery of the lateral segment , right, noted on CT Chest  . Palpitations 02/10/2017   a. 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   . Pneumonia 01/2018  . Raynaud's syndrome   . Splenomegaly 04/2017   noted on CT Chest  . SVT (supraventricular tachycardia) ([place])   . Systolic murmur   . Tendinopathy of rotator cuff    Right  . Tobacco use     Past Surgical History:  Procedure Laterality Date  . SACRO-ILIAC PINNING  2016   fusion, in [place]  . TONSILLECTOMY     age 29  . TOTAL HIP ARTHROPLASTY Right 12/05/2018   Procedure: TOTAL HIP ARTHROPLASTY ANTERIOR APPROACH;  Surgeon: Frederik Pear, MD;  Location: [facility];  Service: Orthopedics;  Laterality: Right;    There were no vitals filed for this visit.  Subjective Assessment - 01/02/19 1520    Subjective  Patient reports that she feels she overdid the stretching for  quad in standing - notes increased pain in the hip toeay. Tyquisha reports that she is no longer sleeping with pillows under her knees. She continues to sleep propped on 3 pillows. She was also seen by the MD today and he is pleased with her progress - feels the hip is healing well. He states that she will likely return to work the first of March.     Currently in Pain?  Yes    Pain Score  3     Pain Location  Hip    Pain Orientation  Right    Pain Descriptors / Indicators  Dull;Aching;Sharp   sharper today    Pain Type  Surgical pain;Chronic pain    Pain Onset  More than a month ago                       [facility] Adult PT Treatment/Exercise - 01/02/19 0001      Knee/Hip Exercises: Stretches   Sports administrator  Right;3 reps;20 seconds   standing with strap      Knee/Hip Exercises: Aerobic   Nustep  L4 x 9.5 min slowly - using UE's at 10       Knee/Hip Exercises: Standing  Hip Abduction  AROM;Right;2 sets;10 reps;Knee straight   leading with heel    Other Standing Knee Exercises  weight bearing with wt equal on both sides; weight shift side to side x 20-30 reps then stagger step shifting wt fwd and back 20-30 reps each foot forward     Other Standing Knee Exercises  side steps at counter 12 ft x 3 each direction; standing on 4 in step Lt foot swinging Rt LE into gentle hip flexion and extension x 30-40 swings;       Knee/Hip Exercises: Seated   Other Seated Knee/Hip Exercises  sitting on large red exercise bal working on trunk mobility with trial of ant/post pelvic tilt(very difficult) lateral movement; stabilizing on ball moving one foot fwd then the opposite all with UE support bilat on tables ~ 10-12 reps each activity; sat on ball without UE support  reaching up with one arm then the other encouraging core activation       Moist Heat Therapy   Number Minutes Moist Heat  12 Minutes    Moist Heat Location  Hip   heat postRt hip/thigh during manual work ant/lat Rt thigh      Cryotherapy   Number Minutes Cryotherapy  12 Minutes    Cryotherapy Location  Hip   anterior Rt    Type of Cryotherapy  Ice pack                  PT Long Term Goals - 12/27/18 1304      PT LONG TERM GOAL #1   Title  Improve posture and alignment with patient to demonstrate improved upright posture with trunk and hip in neutral posture in standing and lying supine 02/07/2019    Time  6    Period  Weeks    Status  New      PT LONG TERM GOAL #2   Title  Increase A/PROM Rt hip to neutral to improve control/movement for transfers and gait 02/07/2019    Time  6    Period  Weeks    Status  New      PT LONG TERM GOAL #3   Title  Decrease pain by 50-75% allowing patient to perform functional and prepare for return to work activities with minimal difficulty 02/07/2019    Time  6    Period  Weeks    Status  New      PT LONG TERM GOAL #4   Title  Independent in HEP 02/07/2019    Time  6    Period  Weeks    Status  New      PT LONG TERM GOAL #5   Title  Improve FOTO to 32% limitation 02/07/2019    Time  6    Period  Weeks    Status  New            Plan - 01/02/19 1547    Clinical Impression Statement  Patient reports increased Rt hip pain and soreness today. She states that she "over did it" with the quad stretch in standing and has also taken the pillows out from under her legs when sleeping. She continues to be propped on 3 pillows with upper body. Patient demonstrates increased limp with ambulation (improves with use of rolling walker - encouraged patient to use walker for home when symptoms flared up). Continued with gentle exercise and gait.     Rehab Potential  Good    PT Frequency  2x / week  PT Duration  6 weeks    PT Treatment/Interventions  Patient/family education;ADLs/Self Care Home Management;Cryotherapy;Electrical Stimulation;Iontophoresis 4mg /ml Dexamethasone;Moist Heat;Ultrasound;Dry needling;Balance training;Gait training;Neuromuscular  re-education;Functional mobility training;Therapeutic activities;Therapeutic exercise    PT Next Visit Plan  review HEP; begin gentle stretching and movement to improve tissue extensibility; transfer training; gait training - work to get patient in neutral trunk and hip extension      PT Home Exercise Plan   Access Code: [email or web address]     Consulted and Agree with Plan of Care  Patient       Patient will benefit from skilled therapeutic intervention in order to improve the following deficits and impairments:  Postural dysfunction, Improper body mechanics, Pain, Increased fascial restricitons, Increased muscle spasms, Decreased mobility, Decreased range of motion, Decreased strength, Decreased activity tolerance, Abnormal gait  Visit Diagnosis: Pain of right hip joint  Other symptoms and signs involving the musculoskeletal system  Abnormal posture  Other abnormalities of gait and mobility  Acute pain of right shoulder     Problem List Patient Active Problem List   Diagnosis Date Noted  . Cellulitis of leg 12/09/2018  . History of total hip arthroplasty, right 12/05/2018  . Avascular necrosis ([place]) 09/10/2018  . Acute right hip pain 08/15/2018  . 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 Chronic right shoulder pain 07/23/2018  . Morbid obesity ([place]) 12/25/2017  . Family history of autoimmune disorder 12/18/2017  . Transaminitis 05/05/2017  . Lung nodule < 6cm on CT 05/05/2017  . Fever 04/27/2017  . PSVT (paroxysmal supraventricular tachycardia) ([place]) 03/13/2017  . Tobacco use 03/13/2017  . Palpitations 02/10/2017  . GAD (generalized anxiety disorder) 02/10/2017  . Hypertension 02/10/2017  . Abnormal thyroid blood test 02/10/2017    Celyn Nilda Simmer PT, MPH  01/02/2019, 3:52 PM  [facility] [place], [place], [address] Phone: [phone]   Fax:  [phone]  Name: Deborah Goodman MRN:  [id number] Date of Birth: December 01, 1976

## 2019-01-09 ENCOUNTER — Encounter: Payer: Self-pay | Admitting: Rehabilitative and Restorative Service Providers"

## 2019-01-09 ENCOUNTER — Ambulatory Visit (INDEPENDENT_AMBULATORY_CARE_PROVIDER_SITE_OTHER): Payer: 59 | Admitting: Rehabilitative and Restorative Service Providers"

## 2019-01-09 DIAGNOSIS — R29898 Other symptoms and signs involving the musculoskeletal system: Secondary | ICD-10-CM | POA: Diagnosis not present

## 2019-01-09 DIAGNOSIS — M25511 Pain in right shoulder: Secondary | ICD-10-CM | POA: Diagnosis not present

## 2019-01-09 DIAGNOSIS — M25551 Pain in right hip: Secondary | ICD-10-CM

## 2019-01-09 DIAGNOSIS — R293 Abnormal posture: Secondary | ICD-10-CM | POA: Diagnosis not present

## 2019-01-09 DIAGNOSIS — R2689 Other abnormalities of gait and mobility: Secondary | ICD-10-CM

## 2019-01-09 NOTE — Therapy (Signed)
[place], [place], [address] Phone: [phone]   Fax:  [phone]  Physical Therapy Treatment  Patient Details  Name: Deborah Goodman MRN: [id number] Date of Birth: 11/10/1976 Referring Provider (PT): Dr Frederik Pear    Encounter Date: 01/09/2019  PT End of Session - 01/09/19 1412    Visit Number  5    Number of Visits  12    Date for PT Re-Evaluation  02/07/19    PT Start Time  1400    PT Stop Time  1445   cold pack end of treatment    PT Time Calculation (min)  45 min    Activity Tolerance  Patient tolerated treatment well       Past Medical History:  Diagnosis Date  . Acid reflux   . Avascular necrosis ([place]) 08/2018   Right hip  . Complication of anesthesia    needs glide scope because trachea sit to the side not in the middle after SI surgery did not have a voice  . Depression   . Fatty liver 04/2017   noted on CT Chest  . Fibromyalgia   . GAD (generalized anxiety disorder) 02/10/2017  . Hypertension 02/10/2017  . Lung nodule 04/2017   Stable 4 mm nodule in the periphery of the lateral segment , right, noted on CT Chest  . Palpitations 02/10/2017   a. 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   . Pneumonia 01/2018  . Raynaud's syndrome   . Splenomegaly 04/2017   noted on CT Chest  . SVT (supraventricular tachycardia) ([place])   . Systolic murmur   . Tendinopathy of rotator cuff    Right  . Tobacco use     Past Surgical History:  Procedure Laterality Date  . SACRO-ILIAC PINNING  2016   fusion, in [place]  . TONSILLECTOMY     age 67  . TOTAL HIP ARTHROPLASTY Right 12/05/2018   Procedure: TOTAL HIP ARTHROPLASTY ANTERIOR APPROACH;  Surgeon: Frederik Pear, MD;  Location: [facility];  Service: Orthopedics;  Laterality: Right;    There were no vitals filed for this visit.  Subjective Assessment - 01/09/19 1403    Subjective  Patient reports that she is doing  her exercises some at home but probably not as much as she should. Hurts to sit or stand for prolonged periods of time. Hurts to put all her weight on the Rt LE     Currently in Pain?  Yes    Pain Score  2     Pain Location  Hip    Pain Orientation  Right    Pain Descriptors / Indicators  Aching;Dull    Pain Type  Surgical pain;Chronic pain    Pain Onset  More than a month ago    Pain Frequency  Intermittent         [facility] PT Assessment - 01/09/19 0001      Assessment   Medical Diagnosis  Rt THA    Referring Provider (PT)  Dr Frederik Pear     Onset Date/Surgical Date  12/05/18    Hand Dominance  Right    Next MD Visit  01/02/2019    Prior Therapy  here for shoulder       AROM   Lumbar Flexion  65% pain in the LB    Lumbar Extension  20% discomfort LB    Lumbar - Right Side Bend  50% pulling Lt hip  Lumbar - Left Side Bend  50% pulling and discomfort on the Rt     Lumbar - Right Rotation  40%     Lumbar - Left Rotation  40%       Flexibility   Quadriceps  significant tightness Rt hip flexors and quad                    [facility] Adult PT Treatment/Exercise - 01/09/19 0001      Ambulation/Gait   Gait Comments  working on gait with PT assist focus on wt shift to Rt with wt bearing Rt - working to avoid trunk shift to Rt with wt bearing on Rt - with verbal and tactile cues of PT -  gait pattern with increased wt bearing Rt LE with stance on Rt - continued trunk shift to the Rt with wt bearing on Rt but this has decreased some       Therapeutic Activites    Therapeutic Activities  --   instructed in myofacial release with ball post/ant hip stand     Neuro Re-ed    Neuro Re-ed Details   postural correction working on wt shift and wt bearing       Knee/Hip Exercises: Stretches   Hip Flexor Stretch  Right;3 reps;30 seconds   seated    Hip Flexor Stretch Limitations  leaning in to the large bal in front to stretch hip flexors 20-30 sec x 3       Knee/Hip Exercises:  Standing   Hip Abduction  AROM;Right;2 sets;10 reps;Knee straight   leading with heel    Hip Extension  AROM;Right;Left;2 sets;10 reps;Knee straight    Other Standing Knee Exercises  weight bearing with wt equal on both sides; weight shift side to side x 20-30 reps then stagger step shifting wt fwd and back 20-30 reps each foot forward     Other Standing Knee Exercises  side steps at counter 12 ft x 3 each direction; standing on 4 in step Lt foot swinging Rt LE into gentle hip flexion and extension x 30-40 swings;       Knee/Hip Exercises: Seated   Sit to Sand  1 set;10 reps;without UE support   last 6 with Rt LE back      Cryotherapy   Number Minutes Cryotherapy  12 Minutes    Cryotherapy Location  Hip   anterior/posterior Rt    Type of Cryotherapy  Ice pack             PT Education - 01/09/19 1454    Education Details  HEP myofacial ball release work     Forensic psychologist) Educated  Patient    Methods  Explanation;Demonstration;Tactile cues;Verbal cues    Comprehension  Verbalized understanding;Returned demonstration;Verbal cues required;Tactile cues required          PT Long Term Goals - 12/27/18 1304      PT LONG TERM GOAL #1   Title  Improve posture and alignment with patient to demonstrate improved upright posture with trunk and hip in neutral posture in standing and lying supine 02/07/2019    Time  6    Period  Weeks    Status  New      PT LONG TERM GOAL #2   Title  Increase A/PROM Rt hip to neutral to improve control/movement for transfers and gait 02/07/2019    Time  6    Period  Weeks    Status  New  PT LONG TERM GOAL #3   Title  Decrease pain by 50-75% allowing patient to perform functional and prepare for return to work activities with minimal difficulty 02/07/2019    Time  6    Period  Weeks    Status  New      PT LONG TERM GOAL #4   Title  Independent in HEP 02/07/2019    Time  6    Period  Weeks    Status  New      PT LONG TERM GOAL #5   Title   Improve FOTO to 32% limitation 02/07/2019    Time  6    Period  Weeks    Status  New            Plan - 01/09/19 1414    Clinical Impression Statement  Patient reports that she can't put all her weight on the Rt LE with standing or walking - hurts and feels weak. Working on her exercises but not consistently. Worked on standing activities and gait with mirror as well as verbal and tactile cues to improve gait pattern.     Rehab Potential  Good    PT Frequency  2x / week    PT Duration  6 weeks    PT Treatment/Interventions  Patient/family education;ADLs/Self Care Home Management;Cryotherapy;Electrical Stimulation;Iontophoresis 4mg /ml Dexamethasone;Moist Heat;Ultrasound;Dry needling;Balance training;Gait training;Neuromuscular re-education;Functional mobility training;Therapeutic activities;Therapeutic exercise    PT Next Visit Plan  review HEP; begin gentle stretching and movement to improve tissue extensibility; transfer training; gait training - work to get patient in neutral trunk and hip extension      PT Home Exercise Plan   Access Code: [email or web address]     Consulted and Agree with Plan of Care  Patient       Patient will benefit from skilled therapeutic intervention in order to improve the following deficits and impairments:  Postural dysfunction, Improper body mechanics, Pain, Increased fascial restricitons, Increased muscle spasms, Decreased mobility, Decreased range of motion, Decreased strength, Decreased activity tolerance, Abnormal gait  Visit Diagnosis: Pain of right hip joint  Other symptoms and signs involving the musculoskeletal system  Abnormal posture  Other abnormalities of gait and mobility  Acute pain of right shoulder     Problem List Patient Active Problem List   Diagnosis Date Noted  . Cellulitis of leg 12/09/2018  . History of total hip arthroplasty, right 12/05/2018  . Avascular necrosis ([place]) 09/10/2018  . Acute right hip pain 08/15/2018  . 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 Chronic right shoulder pain 07/23/2018  . Morbid obesity ([place]) 12/25/2017  . Family history of autoimmune disorder 12/18/2017  . Transaminitis 05/05/2017  . Lung nodule < 6cm on CT 05/05/2017  . Fever 04/27/2017  . PSVT (paroxysmal supraventricular tachycardia) ([place]) 03/13/2017  . Tobacco use 03/13/2017  . Palpitations 02/10/2017  . GAD (generalized anxiety disorder) 02/10/2017  . Hypertension 02/10/2017  . Abnormal thyroid blood test 02/10/2017    Nakeysha Pasqual Nilda Simmer PT, MPH  01/09/2019, 2:56 PM  [facility] [place], [place], [address] Phone: [phone]   Fax:  [phone]  Name: Deborah Goodman MRN: [id number] Date of Birth: 1976/02/08

## 2019-01-11 ENCOUNTER — Ambulatory Visit (INDEPENDENT_AMBULATORY_CARE_PROVIDER_SITE_OTHER): Payer: 59 | Admitting: Physical Therapy

## 2019-01-11 ENCOUNTER — Encounter: Payer: Self-pay | Admitting: Physical Therapy

## 2019-01-11 DIAGNOSIS — M25551 Pain in right hip: Secondary | ICD-10-CM | POA: Diagnosis not present

## 2019-01-11 DIAGNOSIS — R293 Abnormal posture: Secondary | ICD-10-CM

## 2019-01-11 DIAGNOSIS — R29898 Other symptoms and signs involving the musculoskeletal system: Secondary | ICD-10-CM | POA: Diagnosis not present

## 2019-01-11 NOTE — Therapy (Signed)
[place], [place], [address] Phone: [phone]   Fax:  [phone]  Physical Therapy Treatment  Patient Details  Name: Deborah Goodman MRN: [id number] Date of Birth: 09-06-1976 Referring Provider (PT): Dr Frederik Pear    Encounter Date: 01/11/2019  PT End of Session - 01/11/19 1455    Visit Number  6    Number of Visits  12    Date for PT Re-Evaluation  02/07/19    PT Start Time  1440    PT Stop Time  1535   ice pack last 12 min    PT Time Calculation (min)  55 min       Past Medical History:  Diagnosis Date  . Acid reflux   . Avascular necrosis ([place]) 08/2018   Right hip  . Complication of anesthesia    needs glide scope because trachea sit to the side not in the middle after SI surgery did not have a voice  . Depression   . Fatty liver 04/2017   noted on CT Chest  . Fibromyalgia   . GAD (generalized anxiety disorder) 02/10/2017  . Hypertension 02/10/2017  . Lung nodule 04/2017   Stable 4 mm nodule in the periphery of the lateral segment , right, noted on CT Chest  . Palpitations 02/10/2017   a. 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   . Pneumonia 01/2018  . Raynaud's syndrome   . Splenomegaly 04/2017   noted on CT Chest  . SVT (supraventricular tachycardia) ([place])   . Systolic murmur   . Tendinopathy of rotator cuff    Right  . Tobacco use     Past Surgical History:  Procedure Laterality Date  . SACRO-ILIAC PINNING  2016   fusion, in [place]  . TONSILLECTOMY     age 39  . TOTAL HIP ARTHROPLASTY Right 12/05/2018   Procedure: TOTAL HIP ARTHROPLASTY ANTERIOR APPROACH;  Surgeon: Frederik Pear, MD;  Location: [facility];  Service: Orthopedics;  Laterality: Right;    There were no vitals filed for this visit.  Subjective Assessment - 01/11/19 1559    Subjective  Pt reports no new changes since last visit.     Patient Stated Goals  strengthening Rt LE;  increased mobility Rt hip     Currently in Pain?  No/denies    Pain Score  0-No pain         [facility] PT Assessment - 01/11/19 0001      Assessment   Medical Diagnosis  Rt THA    Referring Provider (PT)  Dr Frederik Pear     Onset Date/Surgical Date  12/05/18    Hand Dominance  Right    Next MD Visit  01/02/2019    Prior Therapy  here for shoulder        [facility] Adult PT Treatment/Exercise - 01/11/19 0001      Self-Care   Self-Care  Other Self-Care Comments    Other Self-Care Comments   pt educated on self massage with roller stick to RLE to decrease fascial restriction and improve ROM; pt returned demo with cues.       Knee/Hip Exercises: Stretches   Passive Hamstring Stretch  Right;Left;2 reps;30 seconds   seated, straight back   Quad Stretch  Right;30 seconds;3 reps   seated with foot under chair.    Hip Flexor Stretch  Right;1 rep;20 seconds    Other Knee/Hip Stretches  trial of supine Rt  hip flexor stretch with opp knee bent, Rt foot propped on pad; unable to feel stretch, only increased pain in Rt SI joint.        Knee/Hip Exercises: Standing   Hip Abduction  AROM;Right;2 sets;10 reps;Knee straight   leading with heel    Gait Training  short trials of 25 ft with mirror for feedback and cues to narrow stance and decrease Rt lateral trunk lean with Rt stance phase; quality of gait improved with cues and repetition.     Other Standing Knee Exercises  tandem gait forward  x 25 ft; retro tandem gait with light UE support on counter x 40 ft       Knee/Hip Exercises: Seated   Sit to Sand  1 set;10 reps;without UE support   Rt LE back      Knee/Hip Exercises: Supine   Bridges  1 set   8 reps, with cues to engage core   Bridges Limitations  initially painful, but tolerable with core engaged.       Moist Heat Therapy   Number Minutes Moist Heat  12 Minutes    Moist Heat Location  --   abdomen, due to menstrual cramps     Cryotherapy   Number Minutes Cryotherapy  12 Minutes     Cryotherapy Location  Hip   anterior/posterior Rt and sacrum    Type of Cryotherapy  Ice pack      Manual Therapy   Manual Therapy  Taping    Manual therapy comments  I strip of sensitive skin rock tape place over superior and inferior portions of Rt ant hip incision with zig zag pattern ( did not cover area at crease with scab).               PT Education - 01/11/19 1536    Education Details  Kinesiotape info; self massage with roller stick.     Person(s) Educated  Patient    Methods  Explanation;Demonstration;Handout;Verbal cues    Comprehension  Returned demonstration;Verbalized understanding          PT Long Term Goals - 12/27/18 1304      PT LONG TERM GOAL #1   Title  Improve posture and alignment with patient to demonstrate improved upright posture with trunk and hip in neutral posture in standing and lying supine 02/07/2019    Time  6    Period  Weeks    Status  New      PT LONG TERM GOAL #2   Title  Increase A/PROM Rt hip to neutral to improve control/movement for transfers and gait 02/07/2019    Time  6    Period  Weeks    Status  New      PT LONG TERM GOAL #3   Title  Decrease pain by 50-75% allowing patient to perform functional and prepare for return to work activities with minimal difficulty 02/07/2019    Time  6    Period  Weeks    Status  New      PT LONG TERM GOAL #4   Title  Independent in HEP 02/07/2019    Time  6    Period  Weeks    Status  New      PT LONG TERM GOAL #5   Title  Improve FOTO to 32% limitation 02/07/2019    Time  6    Period  Weeks    Status  New  Plan - 01/11/19 1537    Clinical Impression Statement  Pt able to stand 10 sec on RLE without support and with min increase in pain.  Trial of variations of quad/hip flexor stretch; seated quad stretch tolerable, but supine hip flexor stretch was not.  Trial of kinesiotape over hip incision to assist with desensitization and scar management.  Goals are ongoing at  this time.     Rehab Potential  Good    PT Frequency  2x / week    PT Duration  6 weeks    PT Treatment/Interventions  Patient/family education;ADLs/Self Care Home Management;Cryotherapy;Electrical Stimulation;Iontophoresis 4mg /ml Dexamethasone;Moist Heat;Ultrasound;Dry needling;Balance training;Gait training;Neuromuscular re-education;Functional mobility training;Therapeutic activities;Therapeutic exercise       Patient will benefit from skilled therapeutic intervention in order to improve the following deficits and impairments:  Postural dysfunction, Improper body mechanics, Pain, Increased fascial restricitons, Increased muscle spasms, Decreased mobility, Decreased range of motion, Decreased strength, Decreased activity tolerance, Abnormal gait  Visit Diagnosis: Pain of right hip joint  Other symptoms and signs involving the musculoskeletal system  Abnormal posture     Problem List Patient Active Problem List   Diagnosis Date Noted  . Cellulitis of leg 12/09/2018  . History of total hip arthroplasty, right 12/05/2018  . Avascular necrosis ([place]) 09/10/2018  . Acute right hip pain 08/15/2018  . 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 Chronic right shoulder pain 07/23/2018  . Morbid obesity ([place]) 12/25/2017  . Family history of autoimmune disorder 12/18/2017  . Transaminitis 05/05/2017  . Lung nodule < 6cm on CT 05/05/2017  . Fever 04/27/2017  . PSVT (paroxysmal supraventricular tachycardia) ([place]) 03/13/2017  . Tobacco use 03/13/2017  . Palpitations 02/10/2017  . GAD (generalized anxiety disorder) 02/10/2017  . Hypertension 02/10/2017  . Abnormal thyroid blood test 02/10/2017   [name], PTA 01/11/19 4:00 PM  [place], [place], [address] Phone: [phone]   Fax:  [phone]  Name: Deborah Goodman MRN: [id number] Date of Birth: 03-07-1976

## 2019-01-11 NOTE — Patient Instructions (Signed)

## 2019-01-16 ENCOUNTER — Encounter: Payer: Self-pay | Admitting: Physical Therapy

## 2019-01-16 ENCOUNTER — Ambulatory Visit (INDEPENDENT_AMBULATORY_CARE_PROVIDER_SITE_OTHER): Payer: 59 | Admitting: Physical Therapy

## 2019-01-16 DIAGNOSIS — R293 Abnormal posture: Secondary | ICD-10-CM

## 2019-01-16 DIAGNOSIS — R29898 Other symptoms and signs involving the musculoskeletal system: Secondary | ICD-10-CM | POA: Diagnosis not present

## 2019-01-16 DIAGNOSIS — M25551 Pain in right hip: Secondary | ICD-10-CM

## 2019-01-16 NOTE — Therapy (Signed)
[place], [place], [address] Phone: [phone]   Fax:  [phone]  Physical Therapy Treatment  Patient Details  Name: Deborah Goodman MRN: [id number] Date of Birth: 1976/09/02 Referring Provider (PT): Dr Frederik Pear    Encounter Date: 01/16/2019  PT End of Session - 01/16/19 1513    Visit Number  7    Number of Visits  12    Date for PT Re-Evaluation  02/07/19    PT Start Time  6606    PT Stop Time  1612   Ice pack last 12 min    PT Time Calculation (min)  57 min    Activity Tolerance  Patient tolerated treatment well    Behavior During Therapy  [facility] for tasks assessed/performed       Past Medical History:  Diagnosis Date  . Acid reflux   . Avascular necrosis ([place]) 08/2018   Right hip  . Complication of anesthesia    needs glide scope because trachea sit to the side not in the middle after SI surgery did not have a voice  . Depression   . Fatty liver 04/2017   noted on CT Chest  . Fibromyalgia   . GAD (generalized anxiety disorder) 02/10/2017  . Hypertension 02/10/2017  . Lung nodule 04/2017   Stable 4 mm nodule in the periphery of the lateral segment , right, noted on CT Chest  . Palpitations 02/10/2017   a. 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   . Pneumonia 01/2018  . Raynaud's syndrome   . Splenomegaly 04/2017   noted on CT Chest  . SVT (supraventricular tachycardia) ([place])   . Systolic murmur   . Tendinopathy of rotator cuff    Right  . Tobacco use     Past Surgical History:  Procedure Laterality Date  . SACRO-ILIAC PINNING  2016   fusion, in [place]  . TONSILLECTOMY     age 67  . TOTAL HIP ARTHROPLASTY Right 12/05/2018   Procedure: TOTAL HIP ARTHROPLASTY ANTERIOR APPROACH;  Surgeon: Frederik Pear, MD;  [facility];  Service: Orthopedics;  Laterality: Right;    There were no vitals filed for this visit.  Subjective Assessment - 01/16/19  1520    Subjective  Pt reports the tape on her hip didn't last very long; by evening it had rolled off.  She had no irritation from tape.  She bought a roller for self massage and a chair to do some of the exercises with.  She is still looking for a ball for self- massage, but hasn't found one yet.   she complains of stiffness in hip in the mornings.     Patient Stated Goals  strengthening Rt LE; increased mobility Rt hip     Currently in Pain?  No/denies    Pain Score  0-No pain         [facility] PT Assessment - 01/16/19 0001      Assessment   Medical Diagnosis  Rt THA    Referring Provider (PT)  Dr Frederik Pear     Onset Date/Surgical Date  12/05/18    Hand Dominance  Right    Prior Therapy  here for shoulder        [facility] Adult PT Treatment/Exercise - 01/16/19 0001      Knee/Hip Exercises: Stretches   Passive Hamstring Stretch  Right;Left;2 reps;30 seconds   seated, straight back   Quad Stretch  Right;30  seconds;3 reps   seated with foot under chair.    Hip Flexor Stretch  Right;2 reps;30 seconds      Knee/Hip Exercises: Aerobic   Nustep  L3: 5 min (VC for pt to continue peddling).       Knee/Hip Exercises: Standing   Heel Raises  Both;1 set;10 reps   with toe raises; UE support on railing.    Lateral Step Up  Right;1 set;10 reps;Hand Hold: 2;Step Height: 6"    Functional Squat  1 set;10 reps   eccentric lowering to touch chair seat, holding sink     row with Rt foot back, green band x 10 reps    Other Standing Knee Exercises  tandem stance with horiz  head turns x 30 sec, 2 reps with RLE in back, 1 rep with LLE in back.        Knee/Hip Exercises: Seated   Sit to Sand  1 set;5 reps;without UE support      Knee/Hip Exercises: Supine   Bridges  1 set;5 reps   core engaged; painfree   Other Supine Knee/Hip Exercises  bilat hip IR/ER (gentle with legs in hooklying) then gentle LTR x 4 reps each direction       Cryotherapy   Number Minutes Cryotherapy  12 Minutes     Cryotherapy Location  Hip   anterior/posterior Rt and sacrum    Type of Cryotherapy  Ice pack          PT Long Term Goals - 01/16/19 1601      PT LONG TERM GOAL #1   Title  Improve posture and alignment with patient to demonstrate improved upright posture with trunk and hip in neutral posture in standing and lying supine 02/07/2019    Time  6    Period  Weeks    Status  On-going      PT LONG TERM GOAL #2   Title  Increase A/PROM Rt hip to neutral to improve control/movement for transfers and gait 02/07/2019    Time  6    Period  Weeks    Status  On-going      PT LONG TERM GOAL #3   Title  Decrease pain by 50-75% allowing patient to perform functional and prepare for return to work activities with minimal difficulty 02/07/2019    Time  6    Period  Weeks    Status  Partially Met      PT LONG TERM GOAL #4   Title  Independent in HEP 02/07/2019    Time  6    Period  Weeks    Status  On-going      PT LONG TERM GOAL #5   Title  Improve FOTO to 32% limitation 02/07/2019    Time  6    Period  Weeks    Status  On-going            Plan - 01/16/19 1643    Clinical Impression Statement  Pt initially unable to slowly sit down without plopping down. After 10 reps of functional squat with support at sink, pt was able to complete a stand to sit without UE support with improved control.  Pt reported minimal benefit from tape during the short duration of wear and declined being retaped.  Pt tolerated all exercises well, with minor report of discomfort and tightness.  Pt requires minor cues for encouragment to try new exercises due to fear of reported pain. Pt gradually progressing towards goals.  Rehab Potential  Good    PT Frequency  2x / week    PT Duration  6 weeks    PT Treatment/Interventions  Patient/family education;ADLs/Self Care Home Management;Cryotherapy;Electrical Stimulation;Iontophoresis 70m/ml Dexamethasone;Moist Heat;Ultrasound;Dry needling;Balance training;Gait  training;Neuromuscular re-education;Functional mobility training;Therapeutic activities;Therapeutic exercise    PT Next Visit Plan  continue gentle stretching and movement to improve tissue extensibility; trial of prone position.     PT Home Exercise Plan   Access Code: [email or web address]    Consulted and Agree with Plan of Care  Patient       Patient will benefit from skilled therapeutic intervention in order to improve the following deficits and impairments:  Postural dysfunction, Improper body mechanics, Pain, Increased fascial restricitons, Increased muscle spasms, Decreased mobility, Decreased range of motion, Decreased strength, Decreased activity tolerance, Abnormal gait  Visit Diagnosis: Pain of right hip joint  Other symptoms and signs involving the musculoskeletal system  Abnormal posture     Problem List Patient Active Problem List   Diagnosis Date Noted  . Cellulitis of leg 12/09/2018  . History of total hip arthroplasty, right 12/05/2018  . [place] ([place] 09/10/2018  . Acute right hip pain 08/15/2018  . 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 Chronic right shoulder pain 07/23/2018  . Morbid [place] ([place] 12/25/2017  . Family history of autoimmune disorder 12/18/2017  . Transaminitis 05/05/2017  . Lung nodule < 6cm on CT 05/05/2017  . Fever 04/27/2017  . PSVT (paroxysmal [place]ventricular tachycardia) ([place] 03/13/2017  . Tobacco use 03/13/2017  . Palpitations 02/10/2017  . GAD (generalized anxiety disorder) 02/10/2017  . Hypertension 02/10/2017  . Abnormal thyroid blood test 02/10/2017   JKerin Perna PTA 01/16/19 [facility] [place] [address]Phone: [phone]  Fax:  [phone] Name: KPAMMIE CHIRINOMRN: [id number] of Birth: 619-May-1977

## 2019-01-18 ENCOUNTER — Encounter: Payer: Self-pay | Admitting: Physical Therapy

## 2019-01-18 ENCOUNTER — Ambulatory Visit (INDEPENDENT_AMBULATORY_CARE_PROVIDER_SITE_OTHER): Payer: 59 | Admitting: Physical Therapy

## 2019-01-18 DIAGNOSIS — M25551 Pain in right hip: Secondary | ICD-10-CM

## 2019-01-18 DIAGNOSIS — R293 Abnormal posture: Secondary | ICD-10-CM

## 2019-01-18 DIAGNOSIS — R29898 Other symptoms and signs involving the musculoskeletal system: Secondary | ICD-10-CM
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
## 2019-01-23 ENCOUNTER — Encounter: Payer: Self-pay | Admitting: Physical Therapy

## 2019-01-23 ENCOUNTER — Ambulatory Visit (INDEPENDENT_AMBULATORY_CARE_PROVIDER_SITE_OTHER): Payer: Commercial Managed Care - PPO | Admitting: Physical Therapy

## 2019-01-23 DIAGNOSIS — R293 Abnormal posture: Secondary | ICD-10-CM

## 2019-01-23 DIAGNOSIS — R29898 Other symptoms and signs involving the musculoskeletal system: Secondary | ICD-10-CM | POA: Diagnosis not present

## 2019-01-23 DIAGNOSIS — M25551 Pain in right hip: Secondary | ICD-10-CM
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
## 2019-01-25 ENCOUNTER — Encounter: Payer: 59 | Admitting: Physical Therapy

## 2019-01-28 DIAGNOSIS — Z471 Aftercare following joint replacement surgery: Secondary | ICD-10-CM | POA: Diagnosis not present

## 2019-01-28 DIAGNOSIS — Z96641 Presence of right artificial hip joint: Secondary | ICD-10-CM | POA: Diagnosis not present

## 2019-01-30 ENCOUNTER — Ambulatory Visit (INDEPENDENT_AMBULATORY_CARE_PROVIDER_SITE_OTHER): Payer: Commercial Managed Care - PPO | Admitting: Physical Therapy

## 2019-01-30 ENCOUNTER — Encounter: Payer: Self-pay | Admitting: Physical Therapy

## 2019-01-30 DIAGNOSIS — M25551 Pain in right hip: Secondary | ICD-10-CM

## 2019-01-30 DIAGNOSIS — R293 Abnormal posture: Secondary | ICD-10-CM

## 2019-01-30 DIAGNOSIS — R29898 Other symptoms and signs involving the musculoskeletal system: Secondary | ICD-10-CM

## 2019-01-30 NOTE — Therapy (Signed)
[place], [place], [address] Phone: [phone]   Fax:  [phone]  Physical Therapy Treatment  Patient Details  Name: Deborah Goodman MRN: [id number] Date of Birth: August 05, 1976 Referring Provider (PT): Dr Frederik Pear    Encounter Date: 01/30/2019  PT End of Session - 01/30/19 1430    Visit Number  10    Number of Visits  12    Date for PT Re-Evaluation  02/07/19    PT Start Time  1400    PT Stop Time  1445    PT Time Calculation (min)  45 min    Activity Tolerance  Patient tolerated treatment well    Behavior During Therapy  [facility] for tasks assessed/performed       Past Medical History:  Diagnosis Date  . Acid reflux   . Avascular necrosis ([place]) 08/2018   Right hip  . Complication of anesthesia    needs glide scope because trachea sit to the side not in the middle after SI surgery did not have a voice  . Depression   . Fatty liver 04/2017   noted on CT Chest  . Fibromyalgia   . GAD (generalized anxiety disorder) 02/10/2017  . Hypertension 02/10/2017  . Lung nodule 04/2017   Stable 4 mm nodule in the periphery of the lateral segment , right, noted on CT Chest  . Palpitations 02/10/2017   a. 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   . Pneumonia 01/2018  . Raynaud's syndrome   . Splenomegaly 04/2017   noted on CT Chest  . SVT (supraventricular tachycardia) ([place])   . Systolic murmur   . Tendinopathy of rotator cuff    Right  . Tobacco use     Past Surgical History:  Procedure Laterality Date  . SACRO-ILIAC PINNING  2016   fusion, in [place]  . TONSILLECTOMY     age 43  . TOTAL HIP ARTHROPLASTY Right 12/05/2018   Procedure: TOTAL HIP ARTHROPLASTY ANTERIOR APPROACH;  Surgeon: Frederik Pear, MD;  [facility];  Service: Orthopedics;  Laterality: Right;    There were no vitals filed for this visit.  Subjective Assessment - 01/30/19 1403    Subjective  Pt  reports she has been having more pain in her Rt SI joint lately.  She saw MD last week; She states he is pleased with her progress and agrees she can go back to work at end of Feb.  She is getting a referral for further assessment of her SI joint pain on Monday.     Patient Stated Goals  strengthening Rt LE; increased mobility Rt hip     Currently in Pain?  Yes    Pain Score  2     Pain Location  Sacrum    Pain Orientation  Right    Aggravating Factors   overexerting herself, .    Pain Relieving Factors  ice          [facility] PT Assessment - 01/30/19 0001      Assessment   Medical Diagnosis  Rt THA    Referring Provider (PT)  Dr Frederik Pear     Onset Date/Surgical Date  12/05/18    Hand Dominance  Right    Next MD Visit  01/25/19    Prior Therapy  here for shoulder        [facility] Adult PT Treatment/Exercise - 01/30/19 0001      Lumbar Exercises:  Standing   Functional Squats  15 reps   touching buttocks to elevated table   Row  Strengthening;10 reps;Theraband;Both    Theraband Level (Row)  Level 3 (Green)   with weight shift, to simulate sliding patient up   Row Limitations  repeated with blue band      Lumbar Exercises: Sidelying   Clam  Right;10 reps    Hip Abduction  Right   8 reps; very challenging     Lumbar Exercises: Prone   Other Prone Lumbar Exercises  hip ext while laying prone on white bolster x 10 reps each leg.       Knee/Hip Exercises: Stretches   Passive Hamstring Stretch  Right;Left;2 reps;20 seconds   seated, straight back   Quad Stretch  Right;Left;1 rep;2 reps;20 seconds   seated with foot under chair.      Knee/Hip Exercises: Aerobic   Nustep  L4: (arms/legs) 6 min - 50-60 SPM      Modalities   Modalities  Electrical Stimulation      Cryotherapy   Number Minutes Cryotherapy  15 Minutes    Cryotherapy Location  Hip;Shoulder   anterior/posterior Rt and sacrum    Type of Cryotherapy  Ice pack      Electrical Stimulation   Electrical Stimulation  Location  Rt SI joint and lateral hip    Electrical Stimulation Action  IFC    Electrical Stimulation Parameters  To pt tolerance    Electrical Stimulation Goals  Pain                  PT Long Term Goals - 01/23/19 1541      PT LONG TERM GOAL #1   Title  Improve posture and alignment with patient to demonstrate improved upright posture with trunk and hip in neutral posture in standing and lying supine 02/07/2019    Time  6    Period  Weeks    Status  On-going      PT LONG TERM GOAL #2   Title  Increase A/PROM Rt hip to neutral to improve control/movement for transfers and gait 02/07/2019    Time  6    Period  Weeks    Status  Partially Met      PT LONG TERM GOAL #3   Title  Decrease pain by 50-75% allowing patient to perform functional and prepare for return to work activities with minimal difficulty 02/07/2019    Baseline  pt reports 75% pain reduction, but hasn't returned to work yet.     Time  6    Period  Weeks    Status  Partially Met      PT LONG TERM GOAL #4   Title  Independent in HEP 02/07/2019    Time  6    Period  Weeks    Status  On-going      PT LONG TERM GOAL #5   Title  Improve FOTO to 32% limitation 02/07/2019    Time  6    Period  Weeks    Status  Achieved            Plan - 01/30/19 1709    Clinical Impression Statement  Pt continues with some weakness in RLE; difficulty with SLR last session and sidelying hip abdct this session.  Pt tolerated all exercises well, without increase in pain, just fatigue.  Trial of estim to SI joint at end for pain relief.  Progressing towards goals.     Rehab  Potential  Good    PT Frequency  2x / week    PT Duration  6 weeks    PT Next Visit Plan  continue progressive gentle strengthening and add work simulated exercises, 2 more visits prior to recert vs d/c.      PT Home Exercise Plan   Access Code: [email or web address]     Consulted and Agree with Plan of Care  Patient       Patient will benefit from skilled  therapeutic intervention in order to improve the following deficits and impairments:  Postural dysfunction, Improper body mechanics, Pain, Increased fascial restricitons, Increased muscle spasms, Decreased mobility, Decreased range of motion, Decreased strength, Decreased activity tolerance, Abnormal gait  Visit Diagnosis: Pain of right hip joint  Other symptoms and signs involving the musculoskeletal system  Abnormal posture     Problem List Patient Active Problem List   Diagnosis Date Noted  . Cellulitis of leg 12/09/2018  . History of total hip arthroplasty, right 12/05/2018  . Avascular necrosis ([place]) 09/10/2018  . Acute right hip pain 08/15/2018  . 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 Chronic right shoulder pain 07/23/2018  . Morbid obesity ([place]) 12/25/2017  . Family history of autoimmune disorder 12/18/2017  . Transaminitis 05/05/2017  . Lung nodule < 6cm on CT 05/05/2017  . Fever 04/27/2017  . PSVT (paroxysmal supraventricular tachycardia) ([place]) 03/13/2017  . Tobacco use 03/13/2017  . Palpitations 02/10/2017  . GAD (generalized anxiety disorder) 02/10/2017  . Hypertension 02/10/2017  . Abnormal thyroid blood test 02/10/2017   .[name], PTA 01/30/19 5:10 PM  [place], [place], [address] Phone: [phone]   Fax:  [phone]  Name: TAVARIA MACKINS MRN: [id number] Date of Birth: 1976-08-25

## 2019-02-01 ENCOUNTER — Ambulatory Visit (INDEPENDENT_AMBULATORY_CARE_PROVIDER_SITE_OTHER): Payer: Commercial Managed Care - PPO | Admitting: Physical Therapy

## 2019-02-01 ENCOUNTER — Encounter: Payer: Self-pay | Admitting: Physical Therapy

## 2019-02-01 DIAGNOSIS — M25551 Pain in right hip: Secondary | ICD-10-CM | POA: Diagnosis not present

## 2019-02-01 DIAGNOSIS — R293 Abnormal posture: Secondary | ICD-10-CM | POA: Diagnosis not present

## 2019-02-01 DIAGNOSIS — R29898 Other symptoms and signs involving the musculoskeletal system: Secondary | ICD-10-CM | POA: Diagnosis not present

## 2019-02-01 NOTE — Therapy (Signed)
[place], [place], [address] Phone: [phone]   Fax:  [phone]  Physical Therapy Treatment  Patient Details  Name: Deborah Goodman MRN: [id number] Date of Birth: [birth year] Referring Provider (PT): Dr Frederik Pear    Encounter Date: 02/01/2019  PT End of Session - 02/01/19 1453    Visit Number  11    Number of Visits  12    Date for PT Re-Evaluation  02/07/19    PT Start Time  3016    PT Stop Time  1540    PT Time Calculation (min)  53 min    Activity Tolerance  Patient tolerated treatment well    Behavior During Therapy  Deborah Goodman for tasks assessed/performed       Past Medical History:  Diagnosis Date  . Acid reflux   . Avascular necrosis ([place]) 08/2018   Right hip  . Complication of anesthesia    needs glide scope because trachea sit to the side not in the middle after SI surgery did not have a voice  . Depression   . Fatty liver 04/2017   noted on CT Chest  . Fibromyalgia   . GAD (generalized anxiety disorder) 02/10/2017  . Hypertension 02/10/2017  . Lung nodule 04/2017   Stable 4 mm nodule in the periphery of the lateral segment , right, noted on CT Chest  . Palpitations 02/10/2017   a. 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   . Pneumonia 01/2018  . Raynaud's syndrome   . Splenomegaly 04/2017   noted on CT Chest  . SVT (supraventricular tachycardia) ([place])   . Systolic murmur   . Tendinopathy of rotator cuff    Right  . Tobacco use     Past Surgical History:  Procedure Laterality Date  . SACRO-ILIAC PINNING  2016   fusion, in [place]  . TONSILLECTOMY     age 43  . TOTAL HIP ARTHROPLASTY Right 12/05/2018   Procedure: TOTAL HIP ARTHROPLASTY ANTERIOR APPROACH;  Surgeon: Frederik Pear, MD;  Location: [facility];  Service: Orthopedics;  Laterality: Right;    There were no vitals filed for this visit.  Subjective Assessment - 02/01/19 1454    Subjective  Pt  reports her SI joint pain has decreased since last session.  Otherwise no new changes.     Patient Stated Goals  strengthening Rt LE; increased mobility Rt hip     Currently in Pain?  Yes    Pain Score  2     Pain Location  Shoulder    Pain Orientation  Right    Pain Descriptors / Indicators  Aching    Aggravating Factors   lifting shoulder wrong way    Pain Relieving Factors  ice     Pain Score  1    Pain Location  Sacrum         [facility] PT Assessment - 02/01/19 0001      Assessment   Medical Diagnosis  Rt THA    Referring Provider (PT)  Dr Frederik Pear     Onset Date/Surgical Date  12/05/18    Hand Dominance  Right    Next MD Visit  to be scheduled.     Prior Therapy  here for shoulder        [facility] Adult PT Treatment/Exercise - 02/01/19 0001      Lumbar Exercises: Standing   Functional Squats  10 reps   holding onto sink  Lumbar Exercises: Supine   Heel Slides  5 reps   RLE, core engaged.    Bridge  5 reps    Straight Leg Raise  5 reps      Lumbar Exercises: Sidelying   Clam  --   verbally reviewed   Hip Abduction  --   verbally reviewed for HEP     Lumbar Exercises: Prone   Other Prone Lumbar Exercises  hip ext while laying prone on white bolster x 10 reps each leg, 2 sets      Knee/Hip Exercises: Stretches   Passive Hamstring Stretch  Left;Right;2 reps;30 seconds   seated with straight back   Quad Stretch  Right;Left;2 reps;30 seconds   prone with strap   Gastroc Stretch  Right;Left;3 reps;30 seconds   incline board, then 2 reps off of stairs.      Knee/Hip Exercises: Aerobic   Nustep  L4: (arms/legs) 6 min - 70-80 SPM      Knee/Hip Exercises: Standing   Hip Extension  Stengthening;Right;Left;2 sets;10 reps    Gait Training  short gait trials in front of mirror (25 ft) x 8,  with VC to level shoulders, trunk shift Lt, and tactile cues for reciprical arm swing.  Improved quality with cues and repetition.       Cryotherapy   Number Minutes Cryotherapy   15 Minutes    Cryotherapy Location  Hip;Shoulder   anterior/posterior Rt and sacrum    Type of Cryotherapy  Ice pack      Electrical Stimulation   Electrical Stimulation Location  Rt SI joint and lateral hip    Electrical Stimulation Action  premod to each area    Electrical Stimulation Parameters  intensity to pt tolerance     Electrical Stimulation Goals  Pain                  PT Long Term Goals - 02/01/19 1614      PT LONG TERM GOAL #1   Title  Improve posture and alignment with patient to demonstrate improved upright posture with trunk and hip in neutral posture in standing and lying supine 02/07/2019    Time  6    Period  Weeks    Status  Achieved      PT LONG TERM GOAL #2   Title  Increase A/PROM Rt hip to neutral to improve control/movement for transfers and gait 02/07/2019    Time  6    Period  Weeks    Status  Achieved      PT LONG TERM GOAL #3   Title  Decrease pain by 50-75% allowing patient to perform functional and prepare for return to work activities with minimal difficulty 02/07/2019    Baseline  pt reports 75% pain reduction, but hasn't returned to work yet.     Time  6    Period  Weeks    Status  Partially Met      PT LONG TERM GOAL #4   Title  Independent in HEP 02/07/2019    Time  6    Period  Weeks    Status  On-going      PT LONG TERM GOAL #5   Title  Improve FOTO to 32% limitation 02/07/2019    Time  6    Period  Weeks    Status  Achieved            Plan - 02/01/19 1611    Clinical Impression Statement  Pt's quality  of gait is improving with repeated cues.  Pt continues to have difficulty with Rt SLR in all directions (both weakness and painful after 3-5 reps).  Pt reported some increase in Rt SI joint pain with supine SLR; reduced with use of estim at end of session.  Pt's posture is much improved and she is able to have Rt hip in neutral position in standing and supine; has met LTG#1 and 2.  Pt is on track to meet goals.     Rehab  Potential  Good    PT Frequency  2x / week    PT Duration  6 weeks    PT Treatment/Interventions  Patient/family education;ADLs/Self Care Home Management;Cryotherapy;Electrical Stimulation;Iontophoresis 55m/ml Dexamethasone;Moist Heat;Ultrasound;Dry needling;Balance training;Gait training;Neuromuscular re-education;Functional mobility training;Therapeutic activities;Therapeutic exercise    PT Next Visit Plan  continue progressive gentle strengthening and add work simulated exercises, 1 more visits prior to recert vs d/c.      PT Home Exercise Plan   Access Code: [email or web address]    Consulted and Agree with Plan of Care  Patient       Patient will benefit from skilled therapeutic intervention in order to improve the following deficits and impairments:  Postural dysfunction, Improper body mechanics, Pain, Increased fascial restricitons, Increased muscle spasms, Decreased mobility, Decreased range of motion, Decreased strength, Decreased activity tolerance, Abnormal gait  Visit Diagnosis: Pain of right hip joint  Other symptoms and signs involving the musculoskeletal system  Abnormal posture     Problem List Patient Active Problem List   Diagnosis Date Noted  . Cellulitis of leg 12/09/2018  . History of total hip arthroplasty, right 12/05/2018  . Avascular necrosis ([place] 09/10/2018  . Acute right hip pain 08/15/2018  . 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 Chronic right shoulder pain 07/23/2018  . Morbid obesity ([place] 12/25/2017  . Family history of autoimmune disorder 12/18/2017  . Transaminitis 05/05/2017  . Lung nodule < 6cm on CT 05/05/2017  . Fever 04/27/2017  . PSVT (paroxysmal supraventricular tachycardia) ([place] 03/13/2017  . Tobacco use 03/13/2017  . Palpitations 02/10/2017  . GAD (generalized anxiety disorder) 02/10/2017  . Hypertension 02/10/2017  . Abnormal thyroid blood test 02/10/2017   JKerin Perna PTA 02/01/19 4:15 PM  [facility] [place] [address]Phone: [phone]  Fax:  [phone] Name: Deborah KAZANJIANMRN: [id number] of Birth: [birth year]

## 2019-02-06 ENCOUNTER — Ambulatory Visit (INDEPENDENT_AMBULATORY_CARE_PROVIDER_SITE_OTHER): Payer: Self-pay | Admitting: Physical Therapy

## 2019-02-06 ENCOUNTER — Encounter: Payer: Self-pay | Admitting: Physical Therapy

## 2019-02-06 DIAGNOSIS — R293 Abnormal posture: Secondary | ICD-10-CM

## 2019-02-06 DIAGNOSIS — M25551 Pain in right hip: Secondary | ICD-10-CM

## 2019-02-06 DIAGNOSIS — R2689 Other abnormalities of gait and mobility: Secondary | ICD-10-CM

## 2019-02-06 DIAGNOSIS — R29898 Other symptoms and signs involving the musculoskeletal system: Secondary | ICD-10-CM
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
## 2019-02-08 ENCOUNTER — Ambulatory Visit (INDEPENDENT_AMBULATORY_CARE_PROVIDER_SITE_OTHER): Payer: 59 | Admitting: Rehabilitative and Restorative Service Providers"

## 2019-02-08 ENCOUNTER — Encounter: Payer: Self-pay | Admitting: Rehabilitative and Restorative Service Providers"

## 2019-02-08 DIAGNOSIS — R29898 Other symptoms and signs involving the musculoskeletal system: Secondary | ICD-10-CM

## 2019-02-08 DIAGNOSIS — R293 Abnormal posture: Secondary | ICD-10-CM | POA: Diagnosis not present

## 2019-02-08 DIAGNOSIS — M25511 Pain in right shoulder: Secondary | ICD-10-CM

## 2019-02-08 DIAGNOSIS — R2689 Other abnormalities of gait and mobility: Secondary | ICD-10-CM | POA: Diagnosis not present

## 2019-02-08 DIAGNOSIS — M25551 Pain in right hip: Secondary | ICD-10-CM | POA: Diagnosis not present

## 2019-02-08 NOTE — Patient Instructions (Signed)
Access Code: [email or web address]  URL: [email or web address]  Date: 02/08/2019  Prepared by: Gillermo Murdoch   Exercises  Standing Quad Stretch with Strap - 3 reps - 1 sets - 2x daily - 7x weekly  Gastroc Stretch on Wall - 3 reps - 1 sets - 2x daily - 7x weekly  Supine Piriformis Stretch with Towel - 3 reps - 1 sets - 30 sec hold - 2x daily - 7x weekly

## 2019-02-08 NOTE — Therapy (Signed)
[place], [place], [address] Phone: [phone]   Fax:  [phone]  Physical Therapy Treatment  Patient Details  Name: Deborah Goodman MRN: [id number] Date of Birth: Jun 27, 1976 Referring Provider (PT): Dr Frederik Pear    Encounter Date: 02/08/2019  PT End of Session - 02/08/19 1403    Visit Number  13    Number of Visits  24    Date for PT Re-Evaluation  03/20/19    PT Start Time  1400    PT Stop Time  1445    PT Time Calculation (min)  45 min    Activity Tolerance  Patient tolerated treatment well       Past Medical History:  Diagnosis Date  . Acid reflux   . Avascular necrosis ([place]) 08/2018   Right hip  . Complication of anesthesia    needs glide scope because trachea sit to the side not in the middle after SI surgery did not have a voice  . Depression   . Fatty liver 04/2017   noted on CT Chest  . Fibromyalgia   . GAD (generalized anxiety disorder) 02/10/2017  . Hypertension 02/10/2017  . Lung nodule 04/2017   Stable 4 mm nodule in the periphery of the lateral segment , right, noted on CT Chest  . Palpitations 02/10/2017   a. 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   . Pneumonia 01/2018  . Raynaud's syndrome   . Splenomegaly 04/2017   noted on CT Chest  . SVT (supraventricular tachycardia) ([place])   . Systolic murmur   . Tendinopathy of rotator cuff    Right  . Tobacco use     Past Surgical History:  Procedure Laterality Date  . SACRO-ILIAC PINNING  2016   fusion, in [place]  . TONSILLECTOMY     age 43  . TOTAL HIP ARTHROPLASTY Right 12/05/2018   Procedure: TOTAL HIP ARTHROPLASTY ANTERIOR APPROACH;  Surgeon: Frederik Pear, MD;  Location: [facility];  Service: Orthopedics;  Laterality: Right;    There were no vitals filed for this visit.  Subjective Assessment - 02/08/19 1404    Subjective  Patient reports that her hip is feeling good today. She is being  very careful walking - avoiding slick or slippery spots.     Currently in Pain?  Yes    Pain Score  1     Pain Location  Sacrum    Pain Orientation  Right    Multiple Pain Sites  No    Pain Score  0    Pain Location  Hip    Pain Orientation  Right                       [facility] Adult PT Treatment/Exercise - 02/08/19 0001      Knee/Hip Exercises: Stretches   Passive Hamstring Stretch  Left;Right;30 seconds;3 reps   supine with strap    Quad Stretch  Right;3 reps;Left;2 reps    Piriformis Stretch  Right;3 reps;30 seconds   supine travell - Rt foot resting inside Lt LE    Piriformis Stretch Limitations  pain n the SI area with Rt foot propped on Lt thigh - modified     Gastroc Stretch  Both;2 reps;30 seconds   incline board     Knee/Hip Exercises: Aerobic   Nustep  L5:  6 min - 70-80 SPM      Knee/Hip Exercises: Standing  Gait Training  working on gait forward; backward; side steps toward each side - working on gait pattern and arm swing 20 ft x 6 reps each     Other Standing Knee Exercises  mini split squats with UE support on counter x 10 reps each side       Modalities   Modalities  --   pt in semi-reclined postion      Cryotherapy   Number Minutes Cryotherapy  15 Minutes    Cryotherapy Location  Hip;Shoulder;Lumbar Spine   Rt anterior and posterior hip; Rt shoulder      Electrical Stimulation   Electrical Stimulation Location  Rt SI joint and lateral hip    Electrical Stimulation Action  premod    Electrical Stimulation Parameters  to tolerance    Electrical Stimulation Goals  Pain;Tone             PT Education - 02/08/19 1428    Education Details  HEP     Person(s) Educated  Patient    Methods  Explanation;Demonstration;Tactile cues;Verbal cues;Handout    Comprehension  Verbalized understanding;Returned demonstration;Verbal cues required;Tactile cues required          PT Long Term Goals - 02/06/19 1646      PT LONG TERM GOAL #1    Title  Improve posture and alignment with patient to demonstrate improved upright posture with trunk and hip in neutral posture in standing and lying supine 02/07/2019    Period  Weeks    Status  Achieved      PT LONG TERM GOAL #2   Title  Increase A/PROM Rt hip to neutral to improve control/movement for transfers and gait 02/07/2019    Time  6    Period  Weeks    Status  Achieved      PT LONG TERM GOAL #3   Title  Decrease pain by 50-75% allowing patient to perform functional and prepare for return to work activities with minimal difficulty 03/20/2019    Baseline  pt reports 75% pain reduction, but hasn't returned to work yet.     Time  12    Period  Weeks    Status  Revised      PT LONG TERM GOAL #4   Title  Independent in HEP 03/20/2019    Time  6    Period  Weeks    Status  Revised      PT LONG TERM GOAL #5   Title  Improve FOTO to 32% limitation 02/07/2019    Time  6    Period  Weeks    Status  Achieved      PT LONG TERM GOAL #6   Title  Patient to tolerate standing and walking for 1-2 hours with minimal pain (1-2/10) to be able to return to work 03/20/2019    Time  6    Period  Weeks    Status  New      PT LONG TERM GOAL #7   Title  Patient to tolerate work day tasks for 4-6 hours with minimal pain (2-3/10) allowing patient to gradually return to work 03/20/2019    Time  6    Period  Weeks    Status  New            Plan - 02/08/19 1404    Clinical Impression Statement  Continued improvement with less pain; increased exercise and gait tolerance; improving gait pattern. Working toward functional goals.     Rehab  Potential  Good    PT Frequency  2x / week    PT Duration  6 weeks    PT Treatment/Interventions  Patient/family education;ADLs/Self Care Home Management;Cryotherapy;Electrical Stimulation;Iontophoresis 4mg /ml Dexamethasone;Moist Heat;Ultrasound;Dry needling;Balance training;Gait training;Neuromuscular re-education;Functional mobility training;Therapeutic  activities;Therapeutic exercise    PT Next Visit Plan  continue hip/core strengthening exercises; modalities as indicated     PT Home Exercise Plan   Access Code: [email or web address]     Consulted and Agree with Plan of Care  Patient       Patient will benefit from skilled therapeutic intervention in order to improve the following deficits and impairments:  Postural dysfunction, Improper body mechanics, Pain, Increased fascial restricitons, Increased muscle spasms, Decreased mobility, Decreased range of motion, Decreased strength, Decreased activity tolerance, Abnormal gait  Visit Diagnosis: Pain of right hip joint  Other symptoms and signs involving the musculoskeletal system  Abnormal posture  Other abnormalities of gait and mobility  Acute pain of right shoulder     Problem List Patient Active Problem List   Diagnosis Date Noted  . Cellulitis of leg 12/09/2018  . History of total hip arthroplasty, right 12/05/2018  . Avascular necrosis ([place]) 09/10/2018  . Acute right hip pain 08/15/2018  . 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 Chronic right shoulder pain 07/23/2018  . Morbid obesity ([place]) 12/25/2017  . Family history of autoimmune disorder 12/18/2017  . Transaminitis 05/05/2017  . Lung nodule < 6cm on CT 05/05/2017  . Fever 04/27/2017  . PSVT (paroxysmal supraventricular tachycardia) ([place]) 03/13/2017  . Tobacco use 03/13/2017  . Palpitations 02/10/2017  . GAD (generalized anxiety disorder) 02/10/2017  . Hypertension 02/10/2017  . Abnormal thyroid blood test 02/10/2017    Celyn Nilda Simmer PT, MPH  02/08/2019, 2:41 PM  [facility] [place], [place], [address] Phone: [phone]   Fax:  [phone]  Name: Deborah Goodman MRN: [id number] Date of Birth: [birth year]

## 2019-02-11 ENCOUNTER — Encounter: Payer: 59 | Admitting: Physical Therapy

## 2019-02-14 ENCOUNTER — Encounter: Payer: 59 | Admitting: Physical Therapy

## 2019-02-18 ENCOUNTER — Ambulatory Visit (INDEPENDENT_AMBULATORY_CARE_PROVIDER_SITE_OTHER): Payer: 59 | Admitting: Rehabilitative and Restorative Service Providers"

## 2019-02-18 ENCOUNTER — Encounter: Payer: Self-pay | Admitting: Rehabilitative and Restorative Service Providers"

## 2019-02-18 DIAGNOSIS — R29898 Other symptoms and signs involving the musculoskeletal system: Secondary | ICD-10-CM

## 2019-02-18 DIAGNOSIS — M25551 Pain in right hip: Secondary | ICD-10-CM | POA: Diagnosis not present

## 2019-02-18 DIAGNOSIS — R2689 Other abnormalities of gait and mobility: Secondary | ICD-10-CM

## 2019-02-18 DIAGNOSIS — R293 Abnormal posture: Secondary | ICD-10-CM
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
## 2019-02-20 ENCOUNTER — Encounter: Payer: 59 | Admitting: Rehabilitative and Restorative Service Providers"

## 2019-02-25 ENCOUNTER — Ambulatory Visit (INDEPENDENT_AMBULATORY_CARE_PROVIDER_SITE_OTHER): Payer: 59 | Admitting: Physical Therapy

## 2019-02-25 ENCOUNTER — Encounter: Payer: Self-pay | Admitting: Family Medicine

## 2019-02-25 DIAGNOSIS — R293 Abnormal posture: Secondary | ICD-10-CM

## 2019-02-25 DIAGNOSIS — R29898 Other symptoms and signs involving the musculoskeletal system: Secondary | ICD-10-CM | POA: Diagnosis not present

## 2019-02-25 DIAGNOSIS — M25551 Pain in right hip: Secondary | ICD-10-CM
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
## 2019-02-28 ENCOUNTER — Ambulatory Visit (INDEPENDENT_AMBULATORY_CARE_PROVIDER_SITE_OTHER): Payer: 59 | Admitting: Physical Therapy

## 2019-02-28 ENCOUNTER — Encounter: Payer: Self-pay | Admitting: Physical Therapy

## 2019-02-28 ENCOUNTER — Other Ambulatory Visit: Payer: Self-pay

## 2019-02-28 DIAGNOSIS — R29898 Other symptoms and signs involving the musculoskeletal system: Secondary | ICD-10-CM

## 2019-02-28 DIAGNOSIS — M25551 Pain in right hip: Secondary | ICD-10-CM

## 2019-02-28 DIAGNOSIS — R2689 Other abnormalities of gait and mobility: Secondary | ICD-10-CM

## 2019-02-28 DIAGNOSIS — R293 Abnormal posture: Secondary | ICD-10-CM

## 2019-02-28 DIAGNOSIS — E559 Vitamin D deficiency, unspecified: Secondary | ICD-10-CM | POA: Diagnosis not present

## 2019-02-28 NOTE — Therapy (Addendum)
[place], [place], [address] Phone: [phone]   Fax:  [phone]  Physical Therapy Treatment  Patient Details  Name: Deborah Goodman MRN: [id number] Date of Birth: 12-Apr-1976 Referring Provider (PT): Dr Frederik Pear    Encounter Date: 02/28/2019  PT End of Session - 02/28/19 1603    Visit Number  16    Number of Visits  24    Date for PT Re-Evaluation  03/20/19    PT Start Time  0803    PT Stop Time  0851    PT Time Calculation (min)  48 min    Activity Tolerance  Patient limited by pain    Behavior During Therapy  [facility] for tasks assessed/performed       Past Medical History:  Diagnosis Date  . Acid reflux   . Avascular necrosis ([place]) 08/2018   Right hip  . Complication of anesthesia    needs glide scope because trachea sit to the side not in the middle after SI surgery did not have a voice  . Depression   . Fatty liver 04/2017   noted on CT Chest  . Fibromyalgia   . GAD (generalized anxiety disorder) 02/10/2017  . Hypertension 02/10/2017  . Lung nodule 04/2017   Stable 4 mm nodule in the periphery of the lateral segment , right, noted on CT Chest  . Palpitations 02/10/2017   a. 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   . Pneumonia 01/2018  . Raynaud's syndrome   . Splenomegaly 04/2017   noted on CT Chest  . SVT (supraventricular tachycardia) ([place])   . Systolic murmur   . Tendinopathy of rotator cuff    Right  . Tobacco use     Past Surgical History:  Procedure Laterality Date  . SACRO-ILIAC PINNING  2016   fusion, in [place]  . TONSILLECTOMY     age 43  . TOTAL HIP ARTHROPLASTY Right 12/05/2018   Procedure: TOTAL HIP ARTHROPLASTY ANTERIOR APPROACH;  Surgeon: Frederik Pear, MD;  [facility];  Service: Orthopedics;  Laterality: Right;    There were no vitals filed for this visit.  Subjective Assessment - 02/28/19 1604    Subjective  :  Pt  reports she picked up an extra shift yesterday.  It wasn't as busy so she was able to sit more than other shifts.  She believes her SI joint hurts from strange sleep position and the extra shift.  She voices readiness to hold therapy while she works on her HEP.    Currently in Pain?  Yes    Pain Score  3     Pain Location  Sacrum    Pain Orientation  Right    Pain Descriptors / Indicators  Sharp    Pain Score  1    Pain Location  Groin    Pain Orientation  Right    Pain Descriptors / Indicators  Aching;Tightness         [facility] PT Assessment - 02/28/19 0001      Assessment   Medical Diagnosis  Rt THA    Referring Provider (PT)  Dr Frederik Pear     Onset Date/Surgical Date  12/05/18    Hand Dominance  Right    Next MD Visit  PRN    Prior Therapy  here for shoulder        Aerobic: NuStep L3: 5 min (legs only);  single laps around gym  in between exercises to decrease stiffness.   Seated LE stretches:  Rt/Lt hip flexor x 30 sec x 2 reps each side; Rt/Lt quad stretch x 30 sec x 2 reps; Rt/Lt piriformis (fig 4) x 30 sec x 2   Standing LE stretches: Rt hip flexor stretch with knee straight x 15 sec x 2 reps; Rt/Lt standing adductor stretch x 15 sec x 2 reps each Sit to stand without UE support, core engaged, x 10 reps  Self care:  reviewed self massage with ball to hip and low back musculature; pt returned demo with cues. Modalities: Electric stim, premod to bilat lumbar paraspinals and Rt SI joint, intensity to tolerance, x 15 min.   Ice pack to Rt shoulder, Rt groin, post hip/lumbar x 15 min for pain.    PT Long Term Goals - 02/25/19 1032      PT LONG TERM GOAL #1   Title  Improve posture and alignment with patient to demonstrate improved upright posture with trunk and hip in neutral posture in standing and lying supine 02/07/2019    Time  6    Period  Weeks    Status  Achieved      PT LONG TERM GOAL #2   Title  Increase A/PROM Rt hip to neutral to improve control/movement for  transfers and gait 02/07/2019    Time  6    Period  Weeks    Status  Achieved      PT LONG TERM GOAL #3   Title  Decrease pain by 50-75% allowing patient to perform functional and prepare for return to work activities with minimal difficulty 03/20/2019    Time  12    Period  Weeks    Status  Achieved      PT LONG TERM GOAL #4   Title  Independent in HEP 03/20/2019    Time  6    Period  Weeks    Status  On-going      PT LONG TERM GOAL #5   Title  Improve FOTO to 32% limitation 02/07/2019    Time  6    Period  Weeks    Status  Achieved      PT LONG TERM GOAL #6   Title  Patient to tolerate standing and walking for 1-2 hours with minimal pain (1-2/10) to be able to return to work 03/20/2019    Time  6    Period  Weeks    Status  Achieved      PT LONG TERM GOAL #7   Title  Patient to tolerate work day tasks for 4-6 hours with minimal pain (2-3/10) allowing patient to gradually return to work 03/20/2019    Time  6    Period  Weeks    Status  Achieved            Plan - 02/28/19 1605    Clinical Impression Statement  Pt reporting flare up of symptoms in hip and SI joint, likely from increase in work activities.  Pt was able to tolerate gentle exercises well. Pt reported pain relief during ice/estim, however had increased pain in SI joint with ambulation at end of session.  Pt has partially met her goals, and requests to hold therapy at this time.    Rehab Potential  Good    PT Frequency  2x / week    PT Duration  6 weeks    PT Treatment/Interventions  Patient/family education;ADLs/Self Care Home Management;Cryotherapy;Electrical Stimulation;Iontophoresis 66m/ml Dexamethasone;Moist  Heat;Ultrasound;Dry needling;Balance training;Gait training;Neuromuscular re-education;Functional mobility training;Therapeutic activities;Therapeutic exercise    PT Next Visit Plan  Will hold therapy for 1 mo while pt continues HEP; will d/c after 4/2 if pt doesn't return.        Patient will benefit  from skilled therapeutic intervention in order to improve the following deficits and impairments:  Postural dysfunction, Improper body mechanics, Pain, Increased fascial restricitons, Increased muscle spasms, Decreased mobility, Decreased range of motion, Decreased strength, Decreased activity tolerance, Abnormal gait  Visit Diagnosis: Pain of right hip joint  Other symptoms and signs involving the musculoskeletal system  Abnormal posture  Other abnormalities of gait and mobility     Problem List Patient Active Problem List   Diagnosis Date Noted  . Cellulitis of leg 12/09/2018  . History of total hip arthroplasty, right 12/05/2018  . Avascular necrosis ([place]) 09/10/2018  . Acute right hip pain 08/15/2018  . 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 Chronic right shoulder pain 07/23/2018  . Morbid obesity ([place]) 12/25/2017  . Family history of autoimmune disorder 12/18/2017  . Transaminitis 05/05/2017  . Lung nodule < 6cm on CT 05/05/2017  . Fever 04/27/2017  . PSVT (paroxysmal supraventricular tachycardia) ([place]) 03/13/2017  . Tobacco use 03/13/2017  . Palpitations 02/10/2017  . GAD (generalized anxiety disorder) 02/10/2017  . Hypertension 02/10/2017  . Abnormal thyroid blood test 02/10/2017   [name], PTA 02/28/19 4:08 PM  [place], [place], [address] Phone: [phone]   Fax:  [phone]  Name: Deborah Goodman MRN: [id number] Date of Birth: 23-Jul-1976  PHYSICAL THERAPY DISCHARGE SUMMARY  Visits from Start of Care: 16  Current functional level related to goals / functional outcomes: See last progress note for discharge status    Remaining deficits: Continued limited mobility and ROM; intermittent pain    Education / Equipment: HEP  Plan: Patient agrees to discharge.  Patient goals were partially met. Patient is being discharged due to being pleased  with the current functional level.  ?????     Celyn P. Helene Kelp PT, MPH 04/17/19 3:21 PM

## 2019-03-01 ENCOUNTER — Other Ambulatory Visit: Payer: Self-pay | Admitting: Osteopathic Medicine

## 2019-03-01 ENCOUNTER — Encounter: Payer: Self-pay | Admitting: Osteopathic Medicine

## 2019-03-01 DIAGNOSIS — Z1211 Encounter for screening for malignant neoplasm of colon: Secondary | ICD-10-CM

## 2019-03-01 LAB — VITAMIN D 25 HYDROXY (VIT D DEFICIENCY, FRACTURES): Vit D, 25-Hydroxy: 22 ng/mL — ABNORMAL LOW (ref 30–100)

## 2019-03-01 MED ORDER — VITAMIN D (ERGOCALCIFEROL) 1.25 MG (50000 UNIT) PO CAPS: 50000.0000 [IU] | ORAL_CAPSULE | ORAL | 0 refills | Status: DC

## 2019-03-04 ENCOUNTER — Telehealth: Payer: Self-pay

## 2019-03-04 DIAGNOSIS — Z800 Family history of malignant neoplasm of digestive organs: Secondary | ICD-10-CM

## 2019-03-04 MED ORDER — VITAMIN D (ERGOCALCIFEROL) 1.25 MG (50000 UNIT) PO CAPS: 50000.0000 [IU] | ORAL_CAPSULE | ORAL | 0 refills | Status: DC

## 2019-03-04 NOTE — Telephone Encounter (Signed)
PT called needing referral placed for colonoscopy. PT's PCP had already placed referral.

## 2019-03-11 ENCOUNTER — Telehealth: Payer: Self-pay

## 2019-03-11 ENCOUNTER — Other Ambulatory Visit: Payer: Self-pay | Admitting: Osteopathic Medicine

## 2019-03-11 NOTE — Telephone Encounter (Signed)
Left VM letting pt know we needed to reschedule annual appt on 03/18/19 to a later date due to COVID-19. Also sent pt a MyChart message.

## 2019-03-18 ENCOUNTER — Ambulatory Visit: Payer: 59 | Admitting: Obstetrics & Gynecology

## 2019-03-24 ENCOUNTER — Encounter: Payer: Self-pay | Admitting: Osteopathic Medicine

## 2019-03-25 ENCOUNTER — Encounter: Payer: Self-pay | Admitting: Osteopathic Medicine

## 2019-03-25 ENCOUNTER — Encounter: Payer: Self-pay | Admitting: Family Medicine

## 2019-04-07 ENCOUNTER — Encounter: Payer: Self-pay | Admitting: Osteopathic Medicine

## 2019-04-09 MED ORDER — CYCLOBENZAPRINE HCL 10 MG PO TABS: 5.0000 mg | ORAL_TABLET | Freq: Three times a day (TID) | ORAL | 0 refills | Status: DC | PRN

## 2019-04-21 ENCOUNTER — Encounter: Payer: Self-pay | Admitting: Osteopathic Medicine

## 2019-04-22 ENCOUNTER — Encounter: Payer: Self-pay | Admitting: Family Medicine

## 2019-04-30 ENCOUNTER — Ambulatory Visit: Payer: 59 | Admitting: Osteopathic Medicine

## 2019-04-30 IMAGING — MR MR HIP*R* W/O CM
4 of 5 series · 27 of 40 positions shown · non-contrast
Comparison: None.

CLINICAL DATA: Right hip pain.  Worsening over the past year.

EXAM:
MR OF THE RIGHT HIP WITHOUT CONTRAST
TECHNIQUE: Multiplanar, multisequence MR imaging was performed. No intravenous
contrast was administered.

[Series 3: T1 · coronal · 4.0mm · 0.85mm/px · 8 of 37 slices shown]
[im 1/37]
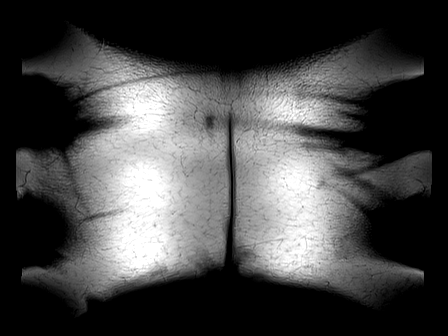
[im 5/37]
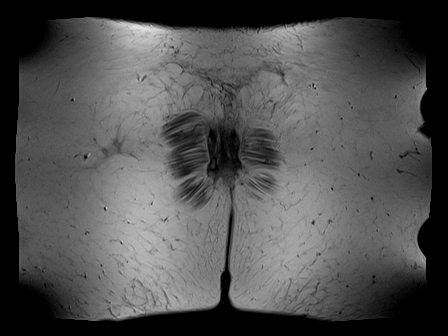
[im 13/37]
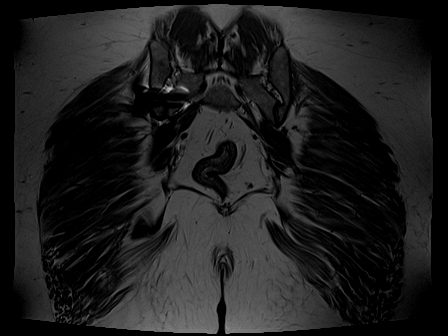
[im 17/37]
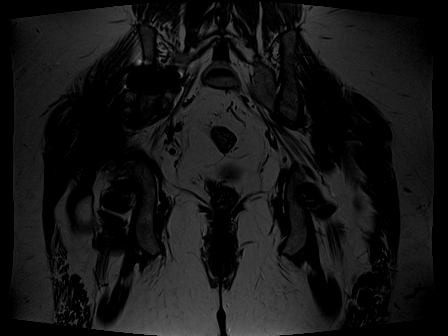
[im 21/37]
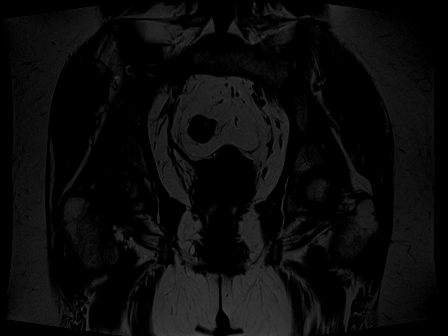
[im 25/37]
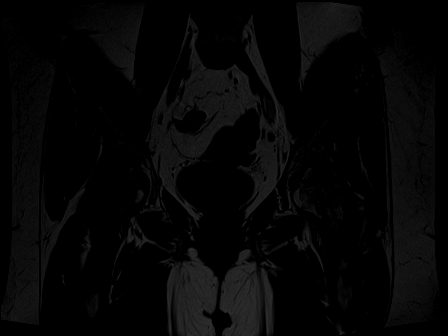
[im 33/37]
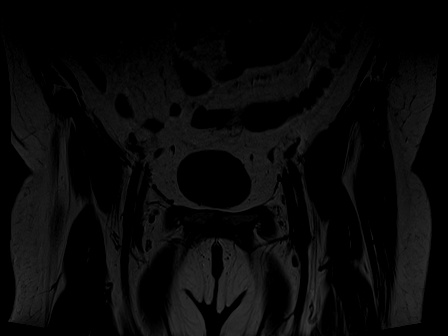
[im 37/37]
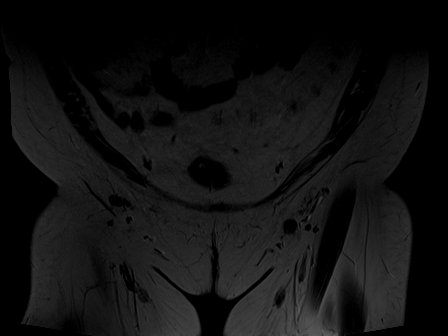

[Series 4: STIR · coronal · 4.0mm · 1.19mm/px · 7 of 37 slices shown]
[im 1/37]
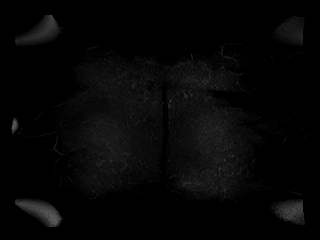
[im 5/37]
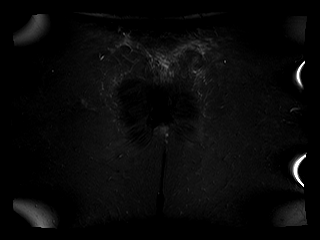
[im 13/37]
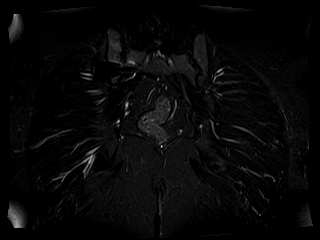
[im 17/37]
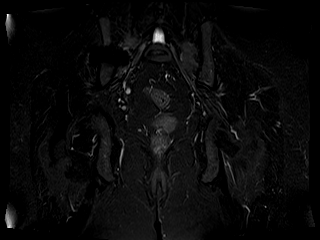
[im 21/37]
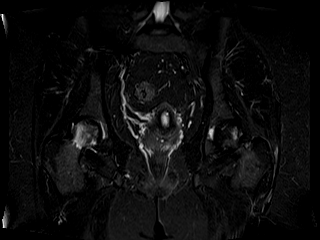
[im 25/37]
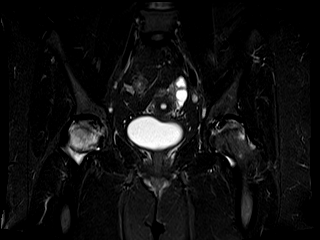
[im 33/37]
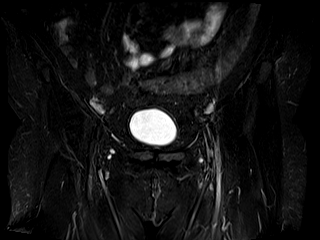

[Series 6: PD fat-sat · sagittal · 4.5mm · 0.35mm/px · 6 of 23 slices shown (1 of 2)]
[im 1/23]
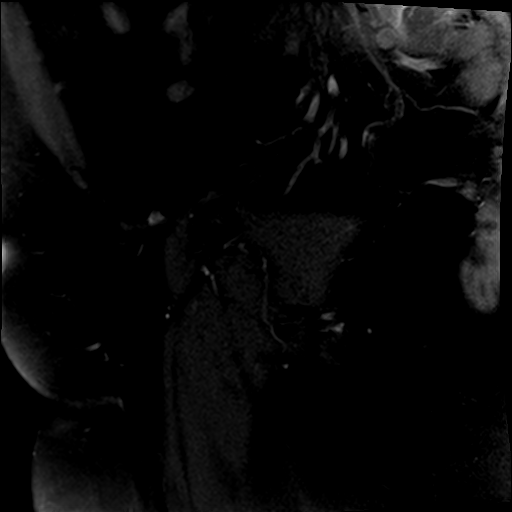
[im 5/23]
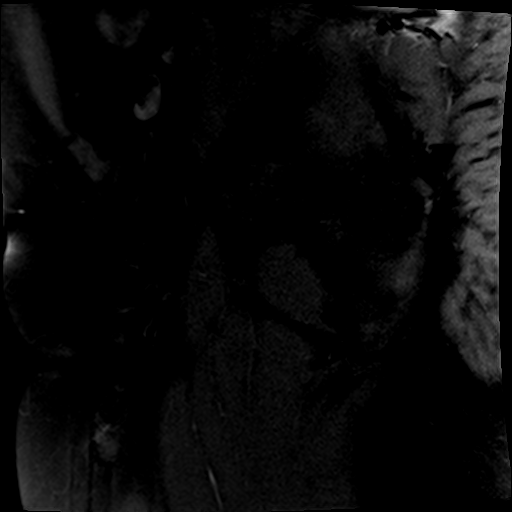
[im 9/23]
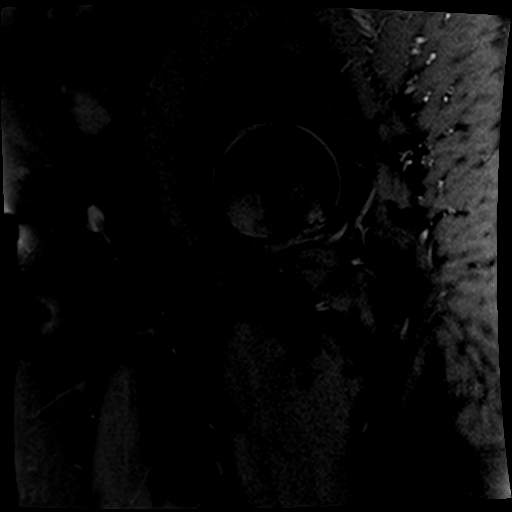
[im 14/23]
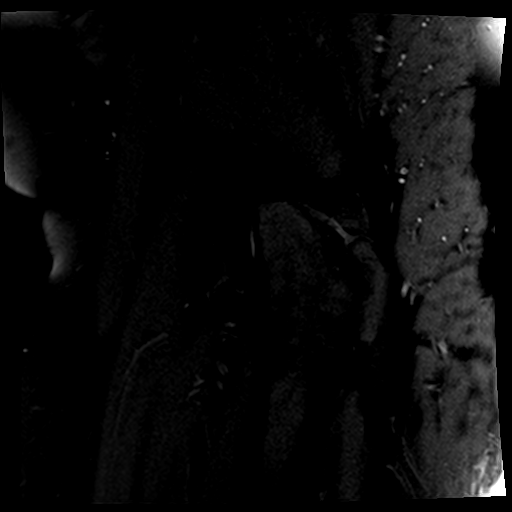
[im 18/23]
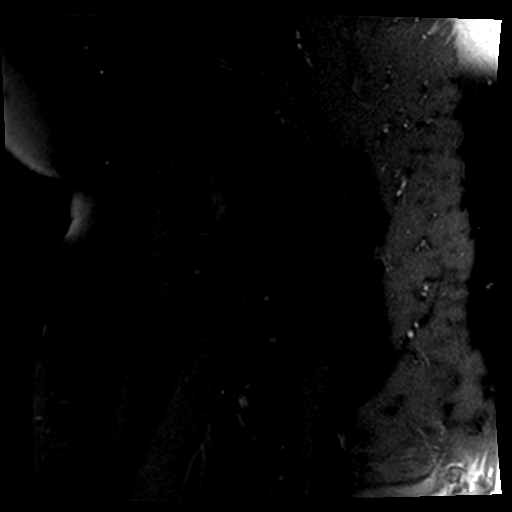
[im 23/23]
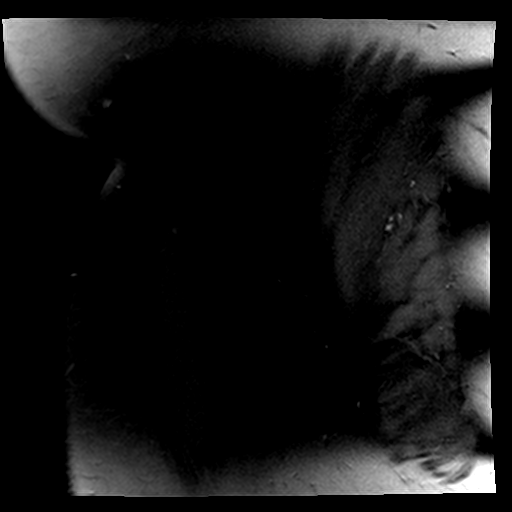

[Series 7: PD fat-sat · coronal · 4.5mm · 0.35mm/px · 6 of 23 slices shown (2 of 2)]
[im 1/23]
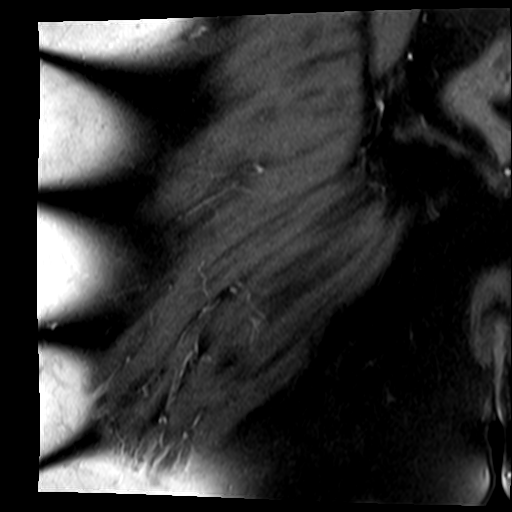
[im 5/23]
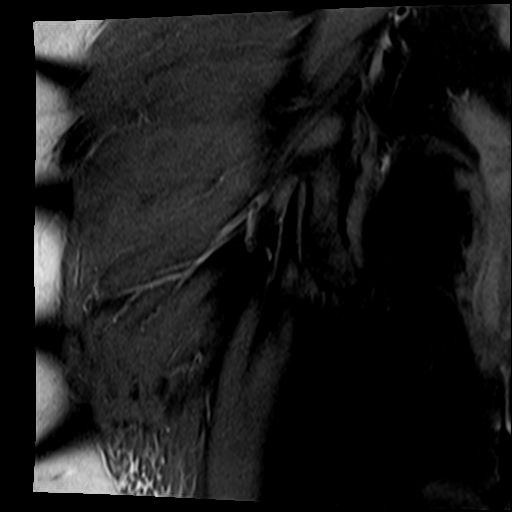
[im 9/23]
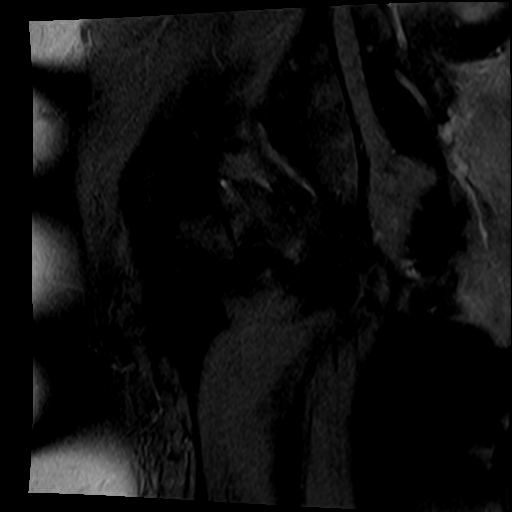
[im 14/23]
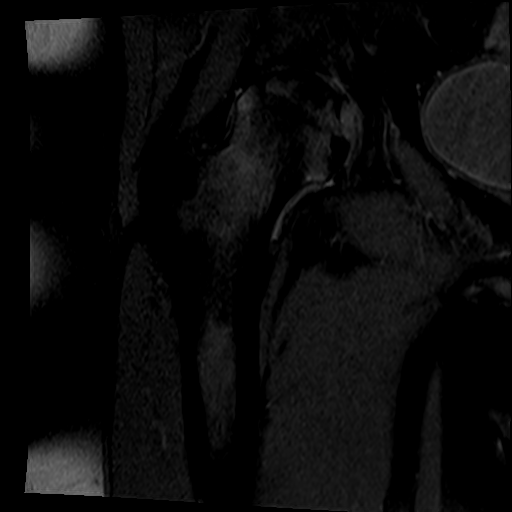
[im 18/23]
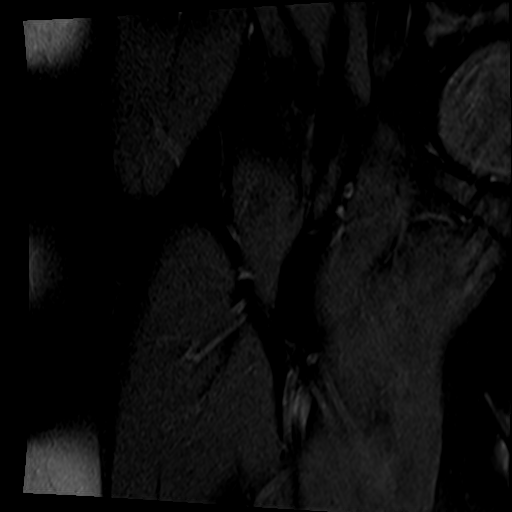
[im 23/23]
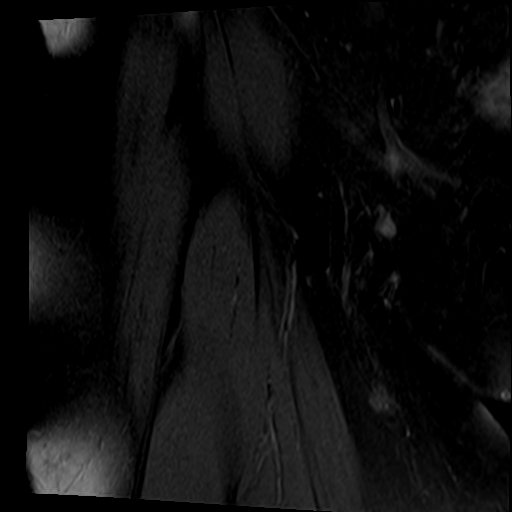

[27 of 40 positions shown; findings below may reference images not displayed]

FINDINGS: Bones:

No hip fracture or dislocation.

Subchondral serpiginous linear signal abnormality in the superior
right femoral head with severe surrounding marrow edema extending
into the femoral neck most consistent with avascular necrosis
without articular surface collapse.

Subchondral serpiginous linear signal abnormality in the superior
left femoral head with minimal adjacent marrow edema consistent with
mild avascular necrosis without articular surface collapse.

No periosteal reaction or bone destruction. No aggressive osseous
lesion.

Normal sacrum and sacroiliac joints. No SI joint widening or erosive
changes. Prior arthrodesis of right sacroiliac joint with
susceptibility artifact partially obscuring the adjacent soft tissue
and osseous structures.

Articular cartilage and labrum

Articular cartilage:  No chondral defect.

Labrum: Grossly intact, but evaluation is limited by lack of
intraarticular fluid.

Joint or bursal effusion

Joint effusion: Small bilateral hip joint effusions, right greater
than left. No SI joint effusion.

Bursae:  No bursa formation.

Muscles and tendons

Flexors: Normal.

Extensors: Normal.

Abductors: Normal.

Adductors: Normal.

Gluteals: Normal.

Hamstrings: Normal.

Other findings

Miscellaneous: No pelvic free fluid. No fluid collection or
hematoma. No inguinal lymphadenopathy. No inguinal hernia.
IMPRESSION: 1. Avascular necrosis of the right femoral head with severe
surrounding marrow edema in the femoral head extending into the
femoral neck. No articular surface collapse.
2. Mild avascular necrosis of the left femoral head with mild
surrounding marrow edema. No articular surface collapse.

## 2019-05-06 ENCOUNTER — Other Ambulatory Visit: Payer: Self-pay | Admitting: Osteopathic Medicine

## 2019-05-06 ENCOUNTER — Other Ambulatory Visit: Payer: Self-pay | Admitting: Family Medicine

## 2019-05-06 DIAGNOSIS — F411 Generalized anxiety disorder: Secondary | ICD-10-CM

## 2019-06-06 ENCOUNTER — Telehealth: Payer: Self-pay

## 2019-06-06 NOTE — Telephone Encounter (Signed)
I called pt to reschedule annual appt that was cancelled in March due to covid. Pt states she doesn't need to be seen because she was told she only needed a pap every three years. I recommended pt to still be seen for annual appt even if she does not need a pap. Pt states she will think about it and call back if she wants to schedule.

## 2019-06-17 ENCOUNTER — Encounter: Payer: Self-pay | Admitting: Family Medicine

## 2019-06-17 ENCOUNTER — Encounter: Payer: Self-pay | Admitting: Osteopathic Medicine

## 2019-06-17 NOTE — Telephone Encounter (Signed)
Pt scheduled for virtual visit 

## 2019-06-18 ENCOUNTER — Telehealth: Payer: 59 | Admitting: Osteopathic Medicine

## 2019-06-25 ENCOUNTER — Other Ambulatory Visit: Payer: Self-pay | Admitting: Family Medicine

## 2019-06-25 DIAGNOSIS — F411 Generalized anxiety disorder: Secondary | ICD-10-CM

## 2019-07-01 ENCOUNTER — Encounter: Payer: Self-pay | Admitting: Sports Medicine

## 2019-07-01 ENCOUNTER — Encounter: Payer: Self-pay | Admitting: Osteopathic Medicine

## 2019-07-01 ENCOUNTER — Encounter: Payer: Self-pay | Admitting: Family Medicine

## 2019-07-01 DIAGNOSIS — F411 Generalized anxiety disorder: Secondary | ICD-10-CM

## 2019-07-01 MED ORDER — SERTRALINE HCL 50 MG PO TABS: 50.0000 mg | ORAL_TABLET | Freq: Every day | ORAL | 3 refills | Status: DC

## 2019-07-01 MED FILL — SERTRALINE HCL 50 MG TABS: 50 | 90 days supply | Qty: 90 | Fill #0

## 2019-07-03 ENCOUNTER — Encounter: Payer: 59 | Admitting: Osteopathic Medicine

## 2019-07-03 ENCOUNTER — Other Ambulatory Visit: Payer: Self-pay | Admitting: Family Medicine

## 2019-07-03 DIAGNOSIS — F411 Generalized anxiety disorder: Secondary | ICD-10-CM

## 2019-07-08 ENCOUNTER — Encounter: Payer: Self-pay | Admitting: Family Medicine

## 2019-07-08 ENCOUNTER — Encounter: Payer: Self-pay | Admitting: Sports Medicine

## 2019-07-08 ENCOUNTER — Encounter: Payer: Self-pay | Admitting: Osteopathic Medicine

## 2019-07-08 DIAGNOSIS — F411 Generalized anxiety disorder: Secondary | ICD-10-CM

## 2019-07-08 MED ORDER — SERTRALINE HCL 50 MG PO TABS: 50.0000 mg | ORAL_TABLET | Freq: Every day | ORAL | 3 refills | Status: DC

## 2019-07-09 ENCOUNTER — Other Ambulatory Visit: Payer: Self-pay

## 2019-07-09 DIAGNOSIS — Z20822 Contact with and (suspected) exposure to covid-19: Secondary | ICD-10-CM

## 2019-07-09 NOTE — Addendum Note (Signed)
Addended by: Leonia Reader on: 07/09/2019 10:49 AM   Modules accepted: Orders

## 2019-07-19 ENCOUNTER — Encounter: Payer: 59 | Admitting: Osteopathic Medicine

## 2019-07-23 DIAGNOSIS — Z800 Family history of malignant neoplasm of digestive organs: Secondary | ICD-10-CM | POA: Diagnosis not present

## 2019-07-23 DIAGNOSIS — R194 Change in bowel habit: Secondary | ICD-10-CM | POA: Diagnosis not present

## 2019-07-23 DIAGNOSIS — R1084 Generalized abdominal pain: Secondary | ICD-10-CM | POA: Diagnosis not present

## 2019-07-29 ENCOUNTER — Telehealth: Payer: Self-pay | Admitting: Internal Medicine

## 2019-07-29 NOTE — Telephone Encounter (Signed)
07-29-19 lmtcb

## 2019-08-19 DIAGNOSIS — K2950 Unspecified chronic gastritis without bleeding: Secondary | ICD-10-CM | POA: Diagnosis not present

## 2019-08-19 DIAGNOSIS — D125 Benign neoplasm of sigmoid colon: Secondary | ICD-10-CM | POA: Diagnosis not present

## 2019-08-19 DIAGNOSIS — Z1211 Encounter for screening for malignant neoplasm of colon: Secondary | ICD-10-CM | POA: Diagnosis not present

## 2019-08-19 DIAGNOSIS — K3189 Other diseases of stomach and duodenum: Secondary | ICD-10-CM | POA: Diagnosis not present

## 2019-08-19 DIAGNOSIS — K635 Polyp of colon: Secondary | ICD-10-CM | POA: Diagnosis not present

## 2019-08-19 DIAGNOSIS — Z800 Family history of malignant neoplasm of digestive organs: Secondary | ICD-10-CM | POA: Diagnosis not present

## 2019-08-19 DIAGNOSIS — R1013 Epigastric pain: Secondary | ICD-10-CM | POA: Diagnosis not present

## 2019-08-19 DIAGNOSIS — D122 Benign neoplasm of ascending colon: Secondary | ICD-10-CM | POA: Diagnosis not present

## 2019-08-19 LAB — HM COLONOSCOPY

## 2019-09-06 ENCOUNTER — Encounter: Payer: Self-pay | Admitting: Osteopathic Medicine

## 2019-09-17 ENCOUNTER — Encounter: Payer: Self-pay | Admitting: Osteopathic Medicine

## 2019-09-17 NOTE — Telephone Encounter (Signed)
Called and scheduled patient

## 2019-09-18 ENCOUNTER — Ambulatory Visit: Payer: 59 | Admitting: Sports Medicine

## 2019-09-23 ENCOUNTER — Other Ambulatory Visit: Payer: Self-pay | Admitting: Gastroenterology

## 2019-09-23 ENCOUNTER — Ambulatory Visit: Payer: 59 | Admitting: Sports Medicine

## 2019-09-23 DIAGNOSIS — K589 Irritable bowel syndrome without diarrhea: Secondary | ICD-10-CM | POA: Diagnosis not present

## 2019-09-23 DIAGNOSIS — R109 Unspecified abdominal pain: Secondary | ICD-10-CM | POA: Diagnosis not present

## 2019-09-23 DIAGNOSIS — R194 Change in bowel habit: Secondary | ICD-10-CM | POA: Diagnosis not present

## 2019-10-16 ENCOUNTER — Other Ambulatory Visit: Payer: Self-pay

## 2019-10-16 ENCOUNTER — Ambulatory Visit (INDEPENDENT_AMBULATORY_CARE_PROVIDER_SITE_OTHER): Payer: 59

## 2019-10-16 ENCOUNTER — Encounter: Payer: Self-pay | Admitting: Obstetrics & Gynecology

## 2019-10-16 ENCOUNTER — Ambulatory Visit (INDEPENDENT_AMBULATORY_CARE_PROVIDER_SITE_OTHER): Payer: 59 | Admitting: Obstetrics & Gynecology

## 2019-10-16 VITALS — BP 171/103 | HR 88 | Ht 63.0 in | Wt 193.0 lb

## 2019-10-16 DIAGNOSIS — Z01419 Encounter for gynecological examination (general) (routine) without abnormal findings: Secondary | ICD-10-CM | POA: Diagnosis not present

## 2019-10-16 DIAGNOSIS — N760 Acute vaginitis: Secondary | ICD-10-CM | POA: Diagnosis not present

## 2019-10-16 DIAGNOSIS — Z1151 Encounter for screening for human papillomavirus (HPV): Secondary | ICD-10-CM | POA: Diagnosis not present

## 2019-10-16 DIAGNOSIS — Z124 Encounter for screening for malignant neoplasm of cervix: Secondary | ICD-10-CM | POA: Diagnosis not present

## 2019-10-16 DIAGNOSIS — N938 Other specified abnormal uterine and vaginal bleeding: Secondary | ICD-10-CM | POA: Diagnosis not present

## 2019-10-16 DIAGNOSIS — N898 Other specified noninflammatory disorders of vagina: Secondary | ICD-10-CM

## 2019-10-16 DIAGNOSIS — R7989 Other specified abnormal findings of blood chemistry: Secondary | ICD-10-CM | POA: Diagnosis not present

## 2019-10-16 DIAGNOSIS — Z113 Encounter for screening for infections with a predominantly sexual mode of transmission: Secondary | ICD-10-CM

## 2019-10-16 DIAGNOSIS — B9689 Other specified bacterial agents as the cause of diseases classified elsewhere: Secondary | ICD-10-CM

## 2019-10-16 DIAGNOSIS — D259 Leiomyoma of uterus, unspecified: Secondary | ICD-10-CM | POA: Diagnosis not present

## 2019-10-16 NOTE — Progress Notes (Signed)
Subjective:    Deborah Goodman is a 43 y.o. single P0 who presents for an annual exam. She reports that in general her periods have been normal until Sept when she had 2 periods and now in Oct she has already had a third one lasting almost the whole month.  The patient is sexually active, but rarely. GYN screening history: last pap: was normal. The patient wears seatbelts: yes. The patient participates in regular exercise: no. Has the patient ever been transfused or tattooed?: yes. The patient reports that there is not domestic violence in her life.   Menstrual History: OB History    Gravida  0   Para  0   Term  0   Preterm  0   AB  0   Living  0     SAB  0   TAB  0   Ectopic  0   Multiple  0   Live Births  0           Menarche age: 78 No LMP recorded.    The following portions of the patient's history were reviewed and updated as appropriate: allergies, current medications, past family history, past medical history, past social history, past surgical history and problem list.  Review of Systems Pertinent items are noted in HPI.   works at [employer] in [place] had flu vaccine 9/20 Uses condoms faithfully Dr. Sheppard Coil is her fam med doc Colonoscopy done 08/20/19 with polyps  Objective:    BP (!) 171/103   Pulse 88   Ht 5\' 3"  (1.6 m)   Wt 193 lb (87.5 kg)   LMP 09/25/2019   BMI 34.19 kg/m   General Appearance:    Alert, cooperative, no distress, appears stated age  Head:    Normocephalic, without obvious abnormality, atraumatic  Eyes:    PERRL, conjunctiva/corneas clear, EOM's intact, fundi    benign, both eyes  Ears:    Normal TM's and external ear canals, both ears  Nose:   Nares normal, septum midline, mucosa normal, no drainage    or sinus tenderness  Throat:   Lips, mucosa, and tongue normal; teeth and gums normal  Neck:   Supple, symmetrical, trachea midline, no adenopathy;    thyroid:  no enlargement/tenderness/nodules; no carotid   bruit or JVD   Back:     Symmetric, no curvature, ROM normal, no CVA tenderness  Lungs:     Clear to auscultation bilaterally, respirations unlabored  Chest Wall:    No tenderness or deformity   Heart:    Regular rate and rhythm, S1 and S2 normal, no murmur, rub   or gallop  Breast Exam:    No tenderness, masses, or nipple abnormality  Abdomen:     Soft, non-tender, bowel sounds active all four quadrants,    no masses, no organomegaly  Genitalia:    Normal female without lesion or tenderness, normal size and shape, anteverted, mobile, non-tender, normal adnexal exam Vaginal discharge c/w bv     Extremities:   Extremities normal, atraumatic, no cyanosis or edema  Pulses:   2+ and symmetric all extremities  Skin:   Skin color, texture, turgor normal, no rashes or lesions  Lymph nodes:   Cervical, supraclavicular, and axillary nodes normal  Neurologic:   CNII-XII intact, normal strength, sensation and reflexes    throughout  .    Assessment:    Healthy female exam.   DUB (3 periods in the past month) Vit D deficiency  Plan:     Mammogram. recommendations discussed. She opts for every other year until 43. Pap smear with cotesting Fasting labs today (did drink a small amount of soda this morning) Wet prep Gyn u/s Come back after u/s done

## 2019-10-16 NOTE — Progress Notes (Signed)
Pt c/o irregular bleeding Last pap 12/25/17- negative Last mammogram- 01/17/18- negative

## 2019-10-17 LAB — CERVICOVAGINAL ANCILLARY ONLY
Bacterial Vaginitis (gardnerella): POSITIVE — AB
Candida Glabrata: NEGATIVE
Candida Vaginitis: NEGATIVE
Chlamydia: NEGATIVE
Comment: NEGATIVE
Comment: NEGATIVE
Comment: NEGATIVE
Comment: NEGATIVE
Comment: NEGATIVE
Comment: NORMAL
Neisseria Gonorrhea: NEGATIVE
Trichomonas: NEGATIVE

## 2019-10-18 ENCOUNTER — Telehealth: Payer: Self-pay

## 2019-10-18 ENCOUNTER — Other Ambulatory Visit: Payer: Self-pay | Admitting: Obstetrics & Gynecology

## 2019-10-18 DIAGNOSIS — B9689 Other specified bacterial agents as the cause of diseases classified elsewhere: Secondary | ICD-10-CM

## 2019-10-18 DIAGNOSIS — N760 Acute vaginitis: Secondary | ICD-10-CM

## 2019-10-18 DIAGNOSIS — R7989 Other specified abnormal findings of blood chemistry: Secondary | ICD-10-CM

## 2019-10-18 MED ORDER — METRONIDAZOLE 500 MG PO TABS: 500.0000 mg | ORAL_TABLET | Freq: Two times a day (BID) | ORAL | 0 refills | Status: DC

## 2019-10-18 MED FILL — METRONIDAZOLE 500 MG TABS: 500 | 7 days supply | Qty: 14 | Fill #0

## 2019-10-18 NOTE — Progress Notes (Signed)
Free t4 and 3 added because of an elevated TSH

## 2019-10-18 NOTE — Progress Notes (Signed)
BV treated with flagyl.

## 2019-10-18 NOTE — Telephone Encounter (Signed)
Pt requested Flagyl be sent through mail order pharmacy. Rx sent.

## 2019-10-18 NOTE — Telephone Encounter (Addendum)
Spoke with Nevin Bloodgood at [facility]. Labs have been added.  ----- Message from Emily Filbert, MD sent at 10/18/2019  9:03 AM EDT ----- Can you please add free t3 and 4 onto her other labs? I ordered it already.

## 2019-10-19 LAB — CBC
HCT: 40.8 % (ref 35.0–45.0)
Hemoglobin: 14 g/dL (ref 11.7–15.5)
MCH: 30.2 pg (ref 27.0–33.0)
MCHC: 34.3 g/dL (ref 32.0–36.0)
MCV: 87.9 fL (ref 80.0–100.0)
MPV: 9.6 fL (ref 7.5–12.5)
Platelets: 255 10*3/uL (ref 140–400)
RBC: 4.64 10*6/uL (ref 3.80–5.10)
RDW: 13.3 % (ref 11.0–15.0)
WBC: 7.1 10*3/uL (ref 3.8–10.8)

## 2019-10-19 LAB — T4, FREE: Free T4: 1.1 ng/dL (ref 0.8–1.8)

## 2019-10-19 LAB — COMPREHENSIVE METABOLIC PANEL
AG Ratio: 1.6 (calc) (ref 1.0–2.5)
ALT: 61 U/L — ABNORMAL HIGH (ref 6–29)
AST: 61 U/L — ABNORMAL HIGH (ref 10–30)
Albumin: 4.4 g/dL (ref 3.6–5.1)
Alkaline phosphatase (APISO): 68 U/L (ref 31–125)
BUN/Creatinine Ratio: 7 (calc) (ref 6–22)
BUN: 5 mg/dL — ABNORMAL LOW (ref 7–25)
CO2: 28 mmol/L (ref 20–32)
Calcium: 9.3 mg/dL (ref 8.6–10.2)
Chloride: 100 mmol/L (ref 98–110)
Creat: 0.69 mg/dL (ref 0.50–1.10)
Globulin: 2.8 g/dL (calc) (ref 1.9–3.7)
Glucose, Bld: 89 mg/dL (ref 65–99)
Potassium: 3.4 mmol/L — ABNORMAL LOW (ref 3.5–5.3)
Sodium: 136 mmol/L (ref 135–146)
Total Bilirubin: 0.7 mg/dL (ref 0.2–1.2)
Total Protein: 7.2 g/dL (ref 6.1–8.1)

## 2019-10-19 LAB — HEMOGLOBIN A1C
Hgb A1c MFr Bld: 4.7 % of total Hgb (ref <5.7)
Mean Plasma Glucose: 88 (calc)
eAG (mmol/L): 4.9 (calc)

## 2019-10-19 LAB — LIPID PANEL
Cholesterol: 302 mg/dL — ABNORMAL HIGH (ref <200)
HDL: 55 mg/dL (ref >=50)
LDL Cholesterol (Calc): 210 mg/dL (calc) — ABNORMAL HIGH
Non-HDL Cholesterol (Calc): 247 mg/dL (calc) — ABNORMAL HIGH (ref <130)
Total CHOL/HDL Ratio: 5.5 (calc) — ABNORMAL HIGH (ref <5.0)
Triglycerides: 194 mg/dL — ABNORMAL HIGH (ref <150)

## 2019-10-19 LAB — TEST AUTHORIZATION

## 2019-10-19 LAB — T3, FREE: T3, Free: 3.6 pg/mL (ref 2.3–4.2)

## 2019-10-19 LAB — TSH: TSH: 6.8 mIU/L — ABNORMAL HIGH

## 2019-10-19 LAB — VITAMIN D 25 HYDROXY (VIT D DEFICIENCY, FRACTURES): Vit D, 25-Hydroxy: 45 ng/mL (ref 30–100)

## 2019-10-23 LAB — CYTOLOGY - PAP
Comment: NEGATIVE
Diagnosis: NEGATIVE
High risk HPV: NEGATIVE

## 2019-10-24 ENCOUNTER — Telehealth: Payer: Self-pay | Admitting: *Deleted

## 2019-10-24 ENCOUNTER — Other Ambulatory Visit: Payer: Self-pay | Admitting: *Deleted

## 2019-10-24 DIAGNOSIS — B9689 Other specified bacterial agents as the cause of diseases classified elsewhere: Secondary | ICD-10-CM

## 2019-10-24 MED ORDER — METRONIDAZOLE 500 MG PO TABS: 500.0000 mg | ORAL_TABLET | Freq: Two times a day (BID) | ORAL | 0 refills | Status: DC

## 2019-10-24 MED FILL — metroNIDAZOLE 500 MG TABS: 500 | 7 days supply | Qty: 14 | Fill #0

## 2019-10-24 NOTE — Telephone Encounter (Signed)
Left patient a message that Dr. Hulan Fray will still need to see her for her F/U appt on 10/28/19 but it can be in office or virtual.

## 2019-10-27 ENCOUNTER — Encounter: Payer: Self-pay | Admitting: Osteopathic Medicine

## 2019-10-28 ENCOUNTER — Telehealth (INDEPENDENT_AMBULATORY_CARE_PROVIDER_SITE_OTHER): Payer: 59 | Admitting: Obstetrics & Gynecology

## 2019-10-28 ENCOUNTER — Telehealth: Payer: Self-pay | Admitting: *Deleted

## 2019-10-28 DIAGNOSIS — N938 Other specified abnormal uterine and vaginal bleeding: Secondary | ICD-10-CM

## 2019-10-28 MED ORDER — MISOPROSTOL 200 MCG PO TABS: ORAL_TABLET | ORAL | 0 refills | Status: DC

## 2019-10-28 MED FILL — miSOPROStol 200 MCG TABS: 200 | 1 days supply | Qty: 3 | Fill #0

## 2019-10-28 NOTE — Progress Notes (Signed)
TELEHEALTH GYNECOLOGY VIRTUAL VIDEO VISIT ENCOUNTER NOTE  Provider location: [facility] at [place]   I connected with Deborah Goodman on 10/28/19 at  1:30 PM EST by MyChart Video Encounter at home and verified that I am speaking with the correct person using two identifiers.   I discussed the limitations, risks, security and privacy concerns of performing an evaluation and management service virtually and the availability of in person appointments. I also discussed with the patient that there may be a patient responsible charge related to this service. The patient expressed understanding and agreed to proceed.   History:  Deborah Goodman is a 43 y.o. G0P0000 female being evaluated today for irregular bleeding.      Past Medical History:  Diagnosis Date  . Acid reflux   . Avascular necrosis ([place]) 08/2018   Right hip  . Complication of anesthesia    needs glide scope because trachea sit to the side not in the middle after SI surgery did not have a voice  . Depression   . Fatty liver 04/2017   noted on CT Chest  . Fibromyalgia   . GAD (generalized anxiety disorder) 02/10/2017  . Hypertension 02/10/2017  . Lung nodule 04/2017   Stable 4 mm nodule in the periphery of the lateral segment , right, noted on CT Chest  . Palpitations 02/10/2017   a. 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   . Pneumonia 01/2018  . Raynaud's syndrome   . Splenomegaly 04/2017   noted on CT Chest  . SVT (supraventricular tachycardia) ([place])   . Systolic murmur   . Tendinopathy of rotator cuff    Right  . Tobacco use    Past Surgical History:  Procedure Laterality Date  . SACRO-ILIAC PINNING  2016   fusion, in [place]  . TONSILLECTOMY     age 24  . TOTAL HIP ARTHROPLASTY Right 12/05/2018   Procedure: TOTAL HIP ARTHROPLASTY ANTERIOR APPROACH;  Surgeon: Frederik Pear, MD;  Location: [facility];  Service: Orthopedics;  Laterality: Right;   The following  portions of the patient's history were reviewed and updated as appropriate: allergies, current medications, past family history, past medical history, past social history, past surgical history and problem list.     Review of Systems:  Pertinent items noted in HPI and remainder of comprehensive ROS otherwise negative.  Physical Exam:   General:  Alert, oriented and cooperative. Patient appears to be in no acute distress.  Mental Status: Normal mood and affect. Normal behavior. Normal judgment and thought content.   Respiratory: Normal respiratory effort, no problems with respiration noted  Rest of physical exam deferred due to type of encounter  Labs and Imaging Results for orders placed or performed in visit on 10/16/19 (from the past 336 hour(s))  Cytology - PAP( [place])   Collection Time: 10/16/19 10:45 AM  Result Value Ref Range   High risk HPV Negative    Adequacy      Satisfactory for evaluation; transformation zone component PRESENT.   Diagnosis      - Negative for intraepithelial lesion or malignancy (NILM)   Comment Normal Reference Range HPV - Negative   Cervicovaginal ancillary only( [place])   Collection Time: 10/16/19 11:10 AM  Result Value Ref Range   Neisseria Gonorrhea Negative    Chlamydia Negative    Trichomonas Negative    Bacterial Vaginitis (gardnerella) Positive (A)    Candida Vaginitis Negative  Candida Glabrata Negative    Comment      Normal Reference Range Bacterial Vaginosis - Negative   Comment Normal Reference Range Candida Species - Negative    Comment Normal Reference Range Candida Galbrata - Negative    Comment Normal Reference Range Trichomonas - Negative    Comment Normal Reference [place] Chlamydia - Negative    Comment      Normal Reference Range Neisseria Gonorrhea - Negative  CBC   Collection Time: 10/16/19 11:20 AM  Result Value Ref Range   WBC 7.1 3.8 - 10.8 Thousand/uL   RBC 4.64 3.80 - 5.10 Million/uL   Hemoglobin 14.0  11.7 - 15.5 g/dL   HCT 40.8 35.0 - 45.0 %   MCV 87.9 80.0 - 100.0 fL   MCH 30.2 27.0 - 33.0 pg   MCHC 34.3 32.0 - 36.0 g/dL   RDW 13.3 11.0 - 15.0 %   Platelets 255 140 - 400 Thousand/uL   MPV 9.6 7.5 - 12.5 fL  Comprehensive metabolic panel   Collection Time: 10/16/19 11:20 AM  Result Value Ref Range   Glucose, Bld 89 65 - 99 mg/dL   BUN 5 (L) 7 - 25 mg/dL   Creat 0.69 0.50 - 1.10 mg/dL   BUN/Creatinine Ratio 7 6 - 22 (calc)   Sodium 136 135 - 146 mmol/L   Potassium 3.4 (L) 3.5 - 5.3 mmol/L   Chloride 100 98 - 110 mmol/L   CO2 28 20 - 32 mmol/L   Calcium 9.3 8.6 - 10.2 mg/dL   Total Protein 7.2 6.1 - 8.1 g/dL   Albumin 4.4 3.6 - 5.1 g/dL   Globulin 2.8 1.9 - 3.7 g/dL (calc)   AG Ratio 1.6 1.0 - 2.5 (calc)   Total Bilirubin 0.7 0.2 - 1.2 mg/dL   Alkaline phosphatase (APISO) 68 31 - 125 U/L   AST 61 (H) 10 - 30 U/L   ALT 61 (H) 6 - 29 U/L  Lipid panel   Collection Time: 10/16/19 11:20 AM  Result Value Ref Range   Cholesterol 302 (H) <200 mg/dL   HDL 55 > OR = 50 mg/dL   Triglycerides 194 (H) <150 mg/dL   LDL Cholesterol (Calc) 210 (H) mg/dL (calc)   Total CHOL/HDL Ratio 5.5 (H) <5.0 (calc)   Non-HDL Cholesterol (Calc) 247 (H) <130 mg/dL (calc)  Hemoglobin A1c   Collection Time: 10/16/19 11:20 AM  Result Value Ref Range   Hgb A1c MFr Bld 4.7 <5.7 % of total Hgb   Mean Plasma Glucose 88 (calc)   eAG (mmol/L) 4.9 (calc)  TSH   Collection Time: 10/16/19 11:20 AM  Result Value Ref Range   TSH 6.80 (H) mIU/L  Vitamin D (25 hydroxy)   Collection Time: 10/16/19 11:20 AM  Result Value Ref Range   Vit D, 25-Hydroxy 45 30 - 100 ng/mL  T4, free   Collection Time: 10/16/19 11:20 AM  Result Value Ref Range   Free T4 1.1 0.8 - 1.8 ng/dL  T3, free   Collection Time: 10/16/19 11:20 AM  Result Value Ref Range   T3, Free 3.6 2.3 - 4.2 pg/mL  TEST AUTHORIZATION   Collection Time: 10/16/19 11:20 AM  Result Value Ref Range   TEST NAME: T3, FREE T4, FREE    TEST CODE: [id number]  [id number]    CLIENT CONTACT: DEANNA SOLA    REPORT ALWAYS MESSAGE SIGNATURE     US Pelvis Transvaginal Non-ob (tv Only)  Result Date: 10/16/2019 CLINICAL DATA:  Dysfunctional uterine  bleeding EXAM: ULTRASOUND PELVIS TRANSVAGINAL TECHNIQUE: Transvaginal ultrasound examination of the pelvis was performed including evaluation of the uterus, ovaries, adnexal regions, and pelvic cul-de-sac. COMPARISON:  None FINDINGS: Uterus Measurements: 7.6 x 3.5 x 3.9 cm = volume: 54 mL. Anteverted. Normal morphology. Small exophytic leiomyoma at upper LEFT uterus 1.7 x 1.1 x 1.3 cm. Endometrium Thickness: 8 mm. Probable hyperechoic mass/polyp at the fundal portion of the endometrial canal 10 x 6 x 9 mm. No endometrial fluid is seen though a small amount of fluid is identified within the endocervical canal. Right ovary Not visualized on either transabdominal or endovaginal imaging, likely obscured by bowel Left ovary Measurements: 2.7 x 1.7 x 2.0 cm = volume: 5 mL. Multiple follicles without mass Other findings: Small paraovarian cyst 11 x 7 x 13 mm. No free pelvic fluid. IMPRESSION: Small exophytic leiomyoma at uterine fundus 1.7 cm greatest size. Nonvisualization of RIGHT ovary with unremarkable LEFT ovary and note of a tiny LEFT paraovarian cyst 1.3 cm diameter. 10 x 6 x 9 mm hyperechoic mass versus polyp at fundal portion of endometrial canal; consider further evaluation with sonohysterogram for confirmation prior to hyst at eroscopy. Endometrial sampling should also be considered if patient is at high risk for endometrial carcinoma. (Ref: Radiological Reasoning: Algorithmic Workup of Abnormal Vaginal Bleeding with Endovaginal Sonography and Sonohysterography. AJR [id number]) Electronically Signed   By: Lavonia Dana M.D.   On: 10/16/2019 13:20       Assessment and Plan:    DUB with polyp- offered hysteroscopy and d&c with polyp removal She doesn't want this. I offered a EMBX in office.  She is currently abstinent  and wants cytotec.      I discussed the assessment and treatment plan with the patient. The patient was provided an opportunity to ask questions and all were answered. The patient agreed with the plan and demonstrated an understanding of the instructions.   The patient was advised to call back or seek an in-person evaluation/go to the ED if the symptoms worsen or if the condition fails to improve as anticipated.  I provided 10 minutes of face-to-face time during this encounter.   Emily Filbert, MD [facility], [place]

## 2019-10-28 NOTE — Telephone Encounter (Signed)
Left patient a message to call and schedule Endo biopsy per Dr. Hulan Fray.

## 2019-10-29 ENCOUNTER — Ambulatory Visit (INDEPENDENT_AMBULATORY_CARE_PROVIDER_SITE_OTHER): Payer: 59 | Admitting: Sports Medicine

## 2019-10-29 ENCOUNTER — Other Ambulatory Visit: Payer: Self-pay

## 2019-10-29 ENCOUNTER — Ambulatory Visit (INDEPENDENT_AMBULATORY_CARE_PROVIDER_SITE_OTHER): Payer: 59

## 2019-10-29 DIAGNOSIS — M25511 Pain in right shoulder: Secondary | ICD-10-CM

## 2019-10-29 DIAGNOSIS — G8929 Other chronic pain: Secondary | ICD-10-CM

## 2019-10-29 MED ORDER — TRAMADOL HCL 50 MG PO TABS: 50.0000 mg | ORAL_TABLET | Freq: Three times a day (TID) | ORAL | 0 refills | Status: DC | PRN

## 2019-10-29 MED FILL — traMADol HCL 50 MG TABS: 50 | 7 days supply | Qty: 21 | Fill #0

## 2019-10-29 NOTE — Progress Notes (Signed)
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  Pain is severe, persistent, localized over the deltoid, anteriorly without radiation.  Moderate weakness.  Waking her from sleep.  I reviewed the past medical history, family history, social history, surgical history, and allergies today and no changes were needed.  Please see the problem list section below in epic for further details.  Past Medical History: Past Medical History:  Diagnosis Date  . Acid reflux   . Avascular necrosis ([place]) 08/2018   Right hip  . Complication of anesthesia    needs glide scope because trachea sit to the side not in the middle after SI surgery did not have a voice  . Depression   . Fatty liver 04/2017   noted on CT Chest  . Fibromyalgia   . GAD (generalized anxiety disorder) 02/10/2017  . Hypertension 02/10/2017  . Lung nodule 04/2017   Stable 4 mm nodule in the periphery of the lateral segment , right, noted on CT Chest  . Palpitations 02/10/2017   a. 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   . Pneumonia 01/2018  . Raynaud's syndrome   . Splenomegaly 04/2017   noted on CT Chest  . SVT (supraventricular tachycardia) ([place])   . Systolic murmur   . Tendinopathy of rotator cuff    Right  . Tobacco use    Past Surgical History: Past Surgical History:  Procedure Laterality Date  . SACRO-ILIAC PINNING  2016   fusion, in [place]  . TONSILLECTOMY     age 82  . TOTAL HIP ARTHROPLASTY Right 12/05/2018   Procedure: TOTAL HIP ARTHROPLASTY ANTERIOR APPROACH;  Surgeon: Frederik Pear, MD;  Location: [facility];  Service: Orthopedics;  Laterality: Right;   Social History: Social History   Socioeconomic History   . Marital status: Single    Spouse name: Not on file  . Number of children: Not on file  . Years of education: Not on file  . Highest education level: Not on file  Occupational History  . Occupation: Financial planner: [employer]  Social Needs  . Financial resource strain: Not on file  . Food insecurity    Worry: Not on file    Inability: Not on file  . Transportation needs    Medical: Not on file    Non-medical: Not on file  Tobacco Use  . Smoking status: Current Every Day Smoker    Packs/day: 0.50    Years: 22.00    Pack years: 11.00    Types: Cigarettes, E-cigarettes  . Smokeless tobacco: Never Used  Substance and Sexual Activity  . Alcohol use: Yes  . Drug use: No  . Sexual activity: Not Currently    Birth control/protection: None  Lifestyle  . Physical activity    Days per week: Not on file    Minutes per session: Not on file  . Stress: Not on file  Relationships  . Social Herbalist on phone: Not on file    Gets together: Not on file    Attends religious service: Not on file    Active member of club or organization: Not on file    Attends meetings of clubs  or organizations: Not on file    Relationship status: Not on file  Other Topics Concern  . Not on file  Social History Narrative  . Not on file   Family History: Family History  Problem Relation Age of Onset  . Depression Mother   . Hypertension Mother   . Cancer Mother        colon  . Diabetes Father   . Arrhythmia Sister        palpitations  . Cancer Maternal Grandfather        COLON  . Diabetes Paternal Grandfather    Allergies: Allergies  Allergen Reactions  . Fish Allergy Nausea And Vomiting    ALL SEAFOOD  . Shellfish Allergy Nausea And Vomiting    ALL SEAFOOD  . Sulfa Antibiotics Other (See Comments)    Thrush reaction Sores in mouth   Medications: See med rec.  Review of Systems: No fevers, chills, night sweats, weight loss, chest pain, or shortness of breath.    Objective:    General: Well Developed, well nourished, and in no acute distress.  Neuro: Alert and oriented x3, extra-ocular muscles intact, sensation grossly intact.  HEENT: Normocephalic, atraumatic, pupils equal round reactive to light, neck supple, no masses, no lymphadenopathy, thyroid nonpalpable.  Skin: Warm and dry, no rashes. Cardiac: Regular rate and rhythm, no murmurs rubs or gallops, no lower extremity edema.  Respiratory: Clear to auscultation bilaterally. Not using accessory muscles, speaking in full sentences. Right shoulder: Inspection reveals no abnormalities, atrophy or asymmetry. Palpation is normal with no tenderness over AC joint or bicipital groove. ROM is full in all planes. Rotator cuff strength weak to abduction Positive Neer and Hawkin's tests, empty can. Speeds and Yergason's tests normal. Positive Obrien's, negative crank, negative clunk, and good stability. Normal scapular function observed. No painful arc and no drop arm sign. No apprehension sign  Procedure: Real-time Ultrasound Guided injection of the right subacromial bursa Device: GE Logiq E  Verbal informed consent obtained.  Time-out conducted.  Noted no overlying erythema, induration, or other signs of local infection.  Skin prepped in a sterile fashion.  Local anesthesia: Topical Ethyl chloride.  With sterile technique and under real time ultrasound guidance:  Noted partial width, partial-thickness tearing of the supraspinatus, 25-gauge needle advanced and I injected 1 cc Kenalog 40, 2 cc lidocaine, 2 cc bupivacaine into the subacromial bursa. Completed without difficulty  Pain immediately resolved suggesting accurate placement of the medication.  Advised to call if fevers/chills, erythema, induration, drainage, or persistent bleeding.  Images permanently stored and available for review in the ultrasound unit.  Impression: Technically successful ultrasound guided injection.  Impression and  Recommendations:    Chronic right shoulder pain Suspected partial width, partial-thickness supraspinatus tear, subacromial injection today. Repeat x-rays, adding home rehabilitation exercises, tramadol. Return to see me in 4 weeks.   ___________________________________________ Gwen Her. Dianah Field, M.D., ABFM., CAQSM. Primary Care and Sports Medicine [facility]  Adjunct Professor of [place] [employer]

## 2019-10-29 NOTE — Assessment & Plan Note (Signed)
Suspected partial width, partial-thickness supraspinatus tear, subacromial injection today. Repeat x-rays, adding home rehabilitation exercises, tramadol. Return to see me in 4 weeks.

## 2019-11-06 ENCOUNTER — Other Ambulatory Visit: Payer: 59 | Admitting: Obstetrics & Gynecology

## 2019-11-06 MED FILL — AMOXICILLIN 500 MG CAPSULE: 500 | 5 days supply | Qty: 20 | Fill #0

## 2019-11-08 MED FILL — miSOPROStol 200 MCG TABS: 200 | 1 days supply | Qty: 3 | Fill #0

## 2019-11-27 ENCOUNTER — Ambulatory Visit: Payer: 59 | Admitting: Sports Medicine

## 2019-11-27 ENCOUNTER — Encounter: Payer: Self-pay | Admitting: Sports Medicine

## 2019-12-03 ENCOUNTER — Other Ambulatory Visit: Payer: Self-pay | Admitting: Osteopathic Medicine

## 2019-12-03 ENCOUNTER — Other Ambulatory Visit: Payer: Self-pay | Admitting: Family Medicine

## 2019-12-03 NOTE — Telephone Encounter (Signed)
Are you ok with her continuing this prescription? It has been over 12 weeks. Please advise

## 2019-12-25 ENCOUNTER — Other Ambulatory Visit: Payer: Self-pay | Admitting: Family Medicine

## 2019-12-25 DIAGNOSIS — I10 Essential (primary) hypertension: Secondary | ICD-10-CM

## 2019-12-31 ENCOUNTER — Ambulatory Visit: Payer: 59 | Admitting: Physician Assistant

## 2020-01-06 ENCOUNTER — Emergency Department
Admission: EM | Admit: 2020-01-06 | Discharge: 2020-01-06 | Disposition: A | Discharge status: Home / Self Care | Payer: Self-pay | Source: Home / Self Care | Attending: Emergency Medicine | Admitting: Emergency Medicine

## 2020-01-06 ENCOUNTER — Other Ambulatory Visit: Payer: Self-pay

## 2020-01-06 ENCOUNTER — Encounter: Payer: Self-pay | Admitting: Emergency Medicine

## 2020-01-06 ENCOUNTER — Emergency Department (INDEPENDENT_AMBULATORY_CARE_PROVIDER_SITE_OTHER): Payer: No Typology Code available for payment source

## 2020-01-06 ENCOUNTER — Ambulatory Visit (INDEPENDENT_AMBULATORY_CARE_PROVIDER_SITE_OTHER): Payer: No Typology Code available for payment source | Admitting: Sports Medicine

## 2020-01-06 DIAGNOSIS — R0789 Other chest pain: Secondary | ICD-10-CM

## 2020-01-06 DIAGNOSIS — R0781 Pleurodynia: Secondary | ICD-10-CM

## 2020-01-06 DIAGNOSIS — S2231XD Fracture of one rib, right side, subsequent encounter for fracture with routine healing: Secondary | ICD-10-CM | POA: Diagnosis not present

## 2020-01-06 DIAGNOSIS — S2341XA Sprain of ribs, initial encounter: Secondary | ICD-10-CM

## 2020-01-06 MED ORDER — HYDROCODONE-ACETAMINOPHEN 5-325 MG PO TABS: 1.0000 | ORAL_TABLET | Freq: Four times a day (QID) | ORAL | 0 refills | Status: DC | PRN

## 2020-01-06 MED ORDER — TIZANIDINE HCL 4 MG PO TABS: 4.0000 mg | ORAL_TABLET | Freq: Three times a day (TID) | ORAL | 0 refills | Status: DC | PRN

## 2020-01-06 MED ORDER — ACETAMINOPHEN 500 MG PO TABS
1000.0000 mg | ORAL_TABLET | Freq: Once | ORAL | Status: AC
  Administered 2020-01-06: 1000 mg via ORAL

## 2020-01-06 MED ORDER — PREDNISONE 50 MG PO TABS: ORAL_TABLET | ORAL | 0 refills | Status: DC

## 2020-01-06 MED ORDER — KETOROLAC TROMETHAMINE 30 MG/ML IJ SOLN
30.0000 mg | Freq: Once | INTRAMUSCULAR | Status: AC
  Administered 2020-01-06: 15:00:00 30 mg via INTRAMUSCULAR

## 2020-01-06 MED ORDER — IBUPROFEN 600 MG PO TABS: 600.0000 mg | ORAL_TABLET | Freq: Four times a day (QID) | ORAL | 0 refills | Status: DC | PRN

## 2020-01-06 MED FILL — IBUPROFEN 600 MG TABLET: 600 | 7 days supply | Qty: 30 | Fill #0

## 2020-01-06 MED FILL — tiZANidine HCL 4 MG TABS: 4 | 10 days supply | Qty: 30 | Fill #0

## 2020-01-06 MED FILL — predniSONE 50 MG TABS: 50 | 5 days supply | Qty: 5 | Fill #0

## 2020-01-06 NOTE — Discharge Instructions (Addendum)
Your x-ray was negative for fracture, pneumothorax or effusion.  Take 600 mg of ibuprofen combined with a Tylenol containing product 3-4 times a day as needed for pain.  Either the ibuprofen with 1 g of Tylenol for mild to moderate pain, or ibuprofen with 1-2 Norco for severe pain.  I would recommend following up with your primary care physician for a possible right upper quadrant ultrasound.  This seems to be very musculoskeletal, however cholelithiasis is in the differential.

## 2020-01-06 NOTE — ED Triage Notes (Addendum)
Rt side chest pain, under RT breast, spasm  x 1 week worse today 6/10, worse laying to sitting.   Fell on ice 4 days ago making the pain worse.

## 2020-01-06 NOTE — ED Provider Notes (Signed)
HPI  SUBJECTIVE:  Deborah Goodman is a 44 y.o. female who presents with 1 week of right sided chest pain located underneath her breast.  She states it radiates to her back.  She states that she fell 4 days ago on her chest and states that the pain has been much worse since.  She describes it as throbbing, intermittent, spasms.  It seems to be worse with large positional changes, reaching up/ forward, coughing, sneezing.  It is better with Flexeril and 800 mg ibuprofen.  She has not tried anything else for this.  She denies fevers, nausea, vomiting, cough, wheezing, shortness of breath, hemoptysis, calf pain or swelling or surgery in the past 4 weeks, recent immobilization, history of DVT, PE, cancer.  She is not on any exogenous estrogen.  She states the pain is not associated with eating.  No recent viral illness.  She has a past medical history of hypertension, PSVT avascular necrosis, fibromyalgia, GERD, she is a smoker.  No history of pulmonary disease, pneumothorax, diabetes, hypercholesterolemia, coronary disease, gallbladder disease.  Family history negative for early MI, gallbladder disease.  LMP: Denies possibility of being pregnant.  [id number], Lanelle Bal, DO  Patient was at the [facility] ER earlier today, had an EKG, left before being seen or labs being drawn    Past Medical History:  Diagnosis Date  . Acid reflux   . Avascular necrosis ([place]) 08/2018   Right hip  . Complication of anesthesia    needs glide scope because trachea sit to the side not in the middle after SI surgery did not have a voice  . Depression   . Fatty liver 04/2017   noted on CT Chest  . Fibromyalgia   . GAD (generalized anxiety disorder) 02/10/2017  . Hypertension 02/10/2017  . Lung nodule 04/2017   Stable 4 mm nodule in the periphery of the lateral segment , right, noted on CT Chest  . Palpitations 02/10/2017   a. 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   . Pneumonia 01/2018  .  Raynaud's syndrome   . Splenomegaly 04/2017   noted on CT Chest  . SVT (supraventricular tachycardia) ([place])   . Systolic murmur   . Tendinopathy of rotator cuff    Right  . Tobacco use     Past Surgical History:  Procedure Laterality Date  . SACRO-ILIAC PINNING  2016   fusion, in [place]  . TONSILLECTOMY     age 25  . TOTAL HIP ARTHROPLASTY Right 12/05/2018   Procedure: TOTAL HIP ARTHROPLASTY ANTERIOR APPROACH;  Surgeon: Frederik Pear, MD;  Location: [facility];  Service: Orthopedics;  Laterality: Right;    Family History  Problem Relation Age of Onset  . Depression Mother   . Hypertension Mother   . Cancer Mother        colon  . Diabetes Father   . Arrhythmia Sister        palpitations  . Cancer Maternal Grandfather        COLON  . Diabetes Paternal Grandfather     Social History   Tobacco Use  . Smoking status: Current Every Day Smoker    Packs/day: 0.50    Years: 22.00    Pack years: 11.00    Types: Cigarettes, E-cigarettes  . Smokeless tobacco: Never Used  Substance Use Topics  . Alcohol use: Yes  . Drug use: No    No current facility-administered medications for this encounter.  Current Outpatient Medications:  .  omeprazole (PRILOSEC) 10 MG capsule, Take 10 mg by mouth daily., Disp: , Rfl:  .  alendronate (FOSAMAX) 70 MG tablet, TAKE 1 TABLET BY MOUTH ONCE EVERY WEEK, Disp: 12 tablet, Rfl: 3 .  cyclobenzaprine (FLEXERIL) 10 MG tablet, TAKE 1/2 TO 1 TABLET (5-10 MG TOTAL) BY MOUTH 3 (THREE) TIMES DAILY AS NEEDED FOR MUSCLE SPASMS., Disp: 90 tablet, Rfl: 0 .  HYDROcodone-acetaminophen (NORCO/VICODIN) 5-325 MG tablet, Take 1-2 tablets by mouth every 6 (six) hours as needed for moderate pain or severe pain., Disp: 12 tablet, Rfl: 0 .  ibuprofen (ADVIL) 600 MG tablet, Take 1 tablet (600 mg total) by mouth every 6 (six) hours as needed., Disp: 30 tablet, Rfl: 0 .  metoprolol succinate (TOPROL-XL) 50 MG 24 hr tablet, TAKE 1 TABLET BY MOUTH ONCE A DAY WITH A MEAL OR  IMMEDIATELY AFTER., Disp: 90 tablet, Rfl: 3 .  predniSONE (DELTASONE) 50 MG tablet, One tab PO daily for 5 days., Disp: 5 tablet, Rfl: 0 .  sertraline (ZOLOFT) 50 MG tablet, Take 1 tablet (50 mg total) by mouth daily., Disp: 90 tablet, Rfl: 3 .  tiZANidine (ZANAFLEX) 4 MG tablet, Take 1 tablet (4 mg total) by mouth every 8 (eight) hours as needed for muscle spasms., Disp: 30 tablet, Rfl: 0 .  Vitamin D, Ergocalciferol, (DRISDOL) 1.25 MG (50000 UT) CAPS capsule, TAKE 1 CAPSULE (50,000 UNITS TOTAL) BY MOUTH EVERY 7 (SEVEN) DAYS. FOR 12 WEEKS, Disp: 12 capsule, Rfl: 0  Allergies  Allergen Reactions  . Fish Allergy Nausea And Vomiting    ALL SEAFOOD  . Shellfish Allergy Nausea And Vomiting    ALL SEAFOOD  . Sulfa Antibiotics Other (See Comments)    Thrush reaction Sores in mouth     ROS  As noted in HPI.   Physical Exam  BP 132/84 (BP Location: Right Arm)   Pulse 96   Temp 98.1 F (36.7 C) (Oral)   Ht 5\' 6"  (1.676 m)   Wt 88.5 kg   LMP 12/20/2019   SpO2 96%   BMI 31.47 kg/m   Constitutional: Well developed, well nourished, appears uncomfortable Eyes: PERRL, EOMI, conjunctiva normal bilaterally HENT: Normocephalic, atraumatic,mucus membranes moist Respiratory: Clear to auscultation bilaterally, no rales, no wheezing, no rhonchi.  Exquisite point tenderness over anterior lower rib cage under R breast.  No bruising, rash.  No crepitus. Cardiovascular: Normal rate and rhythm, no murmurs, no gallops, no rubs GI: Soft, nondistended, normal bowel sounds, nontender, no rebound, no guarding. negative Murphy. Back: no CVAT skin: No rash, skin intact Musculoskeletal: Calves symmetric, nontender no edema Neurologic: Alert & oriented x 3, CN III-XII grossly intact, no motor deficits, sensation grossly intact Psychiatric: Speech and behavior appropriate   ED Course   Medications  ketorolac (TORADOL) 30 MG/ML injection 30 mg (30 mg Intramuscular Given 01/06/20 1440)  acetaminophen  (TYLENOL) tablet 1,000 mg (1,000 mg Oral Given 01/06/20 1440)    Orders Placed This Encounter  Procedures  . DG Ribs Unilateral W/Chest Right    Standing Status:   Standing    Number of Occurrences:   1    Order Specific Question:   Reason for Exam (SYMPTOM  OR DIAGNOSIS REQUIRED)    Answer:   RUQ pain tenderness lower anterior rib cage s/p trauma    Order Specific Question:   Is patient pregnant?    Answer:   No   No results found for this or any previous visit (from the past 24 hour(s)). DG Ribs Unilateral W/Chest Right  Result Date: 01/06/2020 CLINICAL DATA:  Right rib pain after fall. EXAM: RIGHT RIBS AND CHEST - 3+ VIEW COMPARISON:  May 15, 2018. FINDINGS: No fracture or other bone lesions are seen involving the ribs. There is no evidence of pneumothorax or pleural effusion. Both lungs are clear. Heart size and mediastinal contours are within normal limits. IMPRESSION: Negative. Electronically Signed   By: Marijo Conception M.D.   On: 01/06/2020 14:15    ED Clinical Impression  1. Chest wall pain      ED Assessment/Plan   Patient PERC negative.  Doubt PE.  We will obtain right rib series looking for pleural effusion, pneumothorax, pneumonia, fractured rib given that she fell directly on this area and the pain got worse after that.  Cholelithiasis also in the differential but is not associated with eating is clearly related to movement.  Doubt other intra-abdominal process.  Giving Toradol 30, Tylenol 1 g will reevaluate.  EKG: Normal sinus rhythm, rate 70, normal axis, normal intervals.  No hypertrophy.  No ST-T wave changes compared to EKG from 04/19/2018.  Reviewed imaging independently.  No rib fracture, pneumothorax, pleural effusion. See radiology report for full details.  This seems to be very musculoskeletal, aggravated with the recent trauma.  Will send home with ibuprofen 600 mg, with a Tylenol containing product 3 or 4 times a day as needed for pain.  1 g of Tylenol for  mild to moderate pain, 1-2 Norco for severe pain.  Zanaflex.  Will recommend that she follow-up with her primary care physician for right upper quadrant ultrasound.  Discussed imaging, MDM, treatment plan, and plan for follow-up with patient Discussed sn/sx that should prompt return to the ED. patient agrees with plan.   Meds ordered this encounter  Medications  . ketorolac (TORADOL) 30 MG/ML injection 30 mg  . acetaminophen (TYLENOL) tablet 1,000 mg  . HYDROcodone-acetaminophen (NORCO/VICODIN) 5-325 MG tablet    Sig: Take 1-2 tablets by mouth every 6 (six) hours as needed for moderate pain or severe pain.    Dispense:  12 tablet    Refill:  0  . tiZANidine (ZANAFLEX) 4 MG tablet    Sig: Take 1 tablet (4 mg total) by mouth every 8 (eight) hours as needed for muscle spasms.    Dispense:  30 tablet    Refill:  0  . ibuprofen (ADVIL) 600 MG tablet    Sig: Take 1 tablet (600 mg total) by mouth every 6 (six) hours as needed.    Dispense:  30 tablet    Refill:  0    *This clinic note was created using [employer]. Therefore, there may be occasional mistakes despite careful proofreading.  ?    Melynda Ripple, MD 01/07/20 [phone]

## 2020-01-06 NOTE — Progress Notes (Addendum)
    Procedures performed today:    The thorax was strapped with a compressive dressing.  Independent interpretation of tests performed by another provider:   None.  Impression and Recommendations:    Fracture of one rib, right side, subsequent encounter for fracture with routine healing [name] had a fall, I think she has more of a costochondral junction sprain. Rib series x-rays were negative. She has exquisite and severe pain, adding a 5-day burst of prednisone, she did get hydrocodone prescribed at the urgent care. Adding a rib belt. Return to see me in 1 to 2 weeks.  CT reviewed, there is a small fracture of the right sixth rib,This fracture was not visualized on x-rays, this highlights the lack of sensitivity of x-rays for rib fractures, this will heal however rib fractures can take 6 to 12 weeks to stop hurting.  She should continue her chest wrapping.    ___________________________________________ Gwen Her. Dianah Field, M.D., ABFM., CAQSM. Primary Care and [place] Instructor of [place] [facility]

## 2020-01-06 NOTE — Assessment & Plan Note (Addendum)
Deborah Goodman had a fall, I think she has more of a costochondral junction sprain. Rib series x-rays were negative. She has exquisite and severe pain, adding a 5-day burst of prednisone, she did get hydrocodone prescribed at the urgent care. Adding a rib belt. Return to see me in 1 to 2 weeks.  CT reviewed, there is a small fracture of the right sixth rib,This fracture was not visualized on x-rays, this highlights the lack of sensitivity of x-rays for rib fractures, this will heal however rib fractures can take 6 to 12 weeks to stop hurting.  She should continue her chest wrapping.

## 2020-01-07 MED FILL — HYDROCODON-APAP 5-325: 5-325 | 2 days supply | Qty: 12 | Fill #0

## 2020-01-08 ENCOUNTER — Encounter: Payer: Self-pay | Admitting: Osteopathic Medicine

## 2020-01-09 ENCOUNTER — Telehealth: Payer: Self-pay

## 2020-01-09 NOTE — Telephone Encounter (Signed)
To PCP. I don't think this is cardiac but her PCP knows her better.

## 2020-01-09 NOTE — Telephone Encounter (Signed)
Tried multiple time to get pt to reach out to ordering providers. Please advise.Marland Kitchen

## 2020-01-09 NOTE — Telephone Encounter (Signed)
Pt called and left voicemail regarding results of EKG done in clinic on 01/06/2020. Phone call returned, pt informed and she reported improvement. Of note, Pt was seen by Dr. Dianah Field as recommended and is doing well. Pt instructed to call back if any other concerns.

## 2020-01-14 DIAGNOSIS — S2341XD Sprain of ribs, subsequent encounter: Secondary | ICD-10-CM

## 2020-01-14 NOTE — Telephone Encounter (Signed)
Since Dr. Darene Lamer was the last 1 to see her for this and looks like he is already ordered some other testing, and routing to him and he can make the decision.  Needs an appointment with me

## 2020-01-14 NOTE — Telephone Encounter (Signed)
Her story keeps changing, she had told me she developed the chest wall pain after falling.  It looks like she wanted a CT of the chest so I ordered this already, but any concerns for an anginal component are best dealt with by her PCP.  She probably needs an appointment with Dr. Sheppard Coil to further elucidate if there is a nonmusculoskeletal component.

## 2020-01-17 ENCOUNTER — Other Ambulatory Visit: Payer: Self-pay

## 2020-01-17 ENCOUNTER — Ambulatory Visit (INDEPENDENT_AMBULATORY_CARE_PROVIDER_SITE_OTHER): Payer: No Typology Code available for payment source

## 2020-01-17 DIAGNOSIS — S2341XD Sprain of ribs, subsequent encounter: Secondary | ICD-10-CM

## 2020-01-22 MED ORDER — HYDROCODONE-ACETAMINOPHEN 5-325 MG PO TABS: 1.0000 | ORAL_TABLET | Freq: Three times a day (TID) | ORAL | 0 refills | Status: DC | PRN

## 2020-01-22 MED FILL — HYDROCODON-APAP 5-325: 5-325 | 5 days supply | Qty: 30 | Fill #0

## 2020-01-24 ENCOUNTER — Encounter: Payer: Self-pay | Admitting: General Practice

## 2020-02-20 ENCOUNTER — Telehealth (INDEPENDENT_AMBULATORY_CARE_PROVIDER_SITE_OTHER): Payer: No Typology Code available for payment source | Admitting: Osteopathic Medicine

## 2020-02-20 ENCOUNTER — Encounter: Payer: Self-pay | Admitting: Osteopathic Medicine

## 2020-02-20 VITALS — Wt 190.0 lb

## 2020-02-20 DIAGNOSIS — Z5329 Procedure and treatment not carried out because of patient's decision for other reasons: Secondary | ICD-10-CM

## 2020-02-20 DIAGNOSIS — Z91199 Patient's noncompliance with other medical treatment and regimen due to unspecified reason: Secondary | ICD-10-CM

## 2020-02-20 NOTE — Progress Notes (Signed)
I reached out to  Deborah Goodman on 02/20/20 at 10:49 AM   No answer on [employer] No answer on phone  Will need to reschedule

## 2020-02-21 ENCOUNTER — Encounter: Payer: Self-pay | Admitting: Osteopathic Medicine

## 2020-02-21 ENCOUNTER — Telehealth (INDEPENDENT_AMBULATORY_CARE_PROVIDER_SITE_OTHER): Payer: No Typology Code available for payment source | Admitting: Osteopathic Medicine

## 2020-02-21 DIAGNOSIS — R05 Cough: Secondary | ICD-10-CM

## 2020-02-21 DIAGNOSIS — R059 Cough, unspecified: Secondary | ICD-10-CM

## 2020-02-21 MED ORDER — PREDNISONE 20 MG PO TABS: 20.0000 mg | ORAL_TABLET | Freq: Two times a day (BID) | ORAL | 0 refills | Status: DC

## 2020-02-21 MED ORDER — AZITHROMYCIN 250 MG PO TABS: ORAL_TABLET | ORAL | 0 refills | Status: DC

## 2020-02-21 MED ORDER — GUAIFENESIN-CODEINE 100-10 MG/5ML PO SYRP: 5.0000 mL | ORAL_SOLUTION | Freq: Four times a day (QID) | ORAL | 0 refills | Status: DC | PRN

## 2020-02-21 NOTE — Progress Notes (Signed)
Virtual Visit via Video (App used: MyChart) Note  I connected with      Deborah Goodman on 02/21/20 at 8:34 AM  by a telemedicine application and verified that I am speaking with the correct person using two identifiers.  Patient is at home I am in office   I discussed the limitations of evaluation and management by telemedicine and the availability of in person appointments. The patient expressed understanding and agreed to proceed.  History of Present Illness: Deborah Goodman is a 44 y.o. female who would like to discuss coughing  Concern for bronchitis. Smoker but not heavy smoker. Tested (-)COVID. Ongoing about a week. Nonproductive cough. No fever. No serious SOB.        Observations/Objective: There were no vitals taken for this visit. BP Readings from Last 3 Encounters:  01/06/20 132/84  10/29/19 135/84  10/16/19 (!) 171/103   Exam: Normal Speech.  Coughing but no resp distress noted  Lab and Radiology Results No results found for this or any previous visit (from the past 72 hour(s)). No results found.     Assessment and Plan: 44 y.o. female with The encounter diagnosis was Cough.   PDMP not reviewed this encounter. Orders Placed This Encounter  Procedures  . DG Chest 2 View    Order Specific Question:   Reason for Exam (SYMPTOM  OR DIAGNOSIS REQUIRED)    Answer:   cough    Order Specific Question:   Is patient pregnant?    Answer:   No    Order Specific Question:   Preferred imaging location?    Answer:   [facility]    Order Specific Question:   Radiology Contrast Protocol - do NOT remove file path    Answer:   [email or web address]    PATIENT INSTRUCTIONS:   Rx sent to pharmacy: Meds ordered this encounter  Medications  . predniSONE (DELTASONE) 20 MG tablet    Sig: Take 1 tablet (20 mg total) by mouth 2 (two) times daily with a meal.    Dispense:  10 tablet    Refill:  0  .  guaiFENesin-codeine (ROBITUSSIN AC) 100-10 MG/5ML syrup    Sig: Take 5-10 mLs by mouth 4 (four) times daily as needed for cough.    Dispense:  180 mL    Refill:  0  . azithromycin (ZITHROMAX) 250 MG tablet    Sig: 2 tabs po x1 on Day 1, then 1 tab po daily on Days 2 - 5    Dispense:  6 tablet    Refill:  0   Azithromycin antibiotic - please DO NOT take this unless you've been trying the steroids and cough medicine for 2-3 days and you aren't feeling better, or take if you get worse.   If worse or no better, chest Xray. If severe symptoms, please seek in-person evaluation in urgent care or ER.    Patient Instructions  Medications & Home Remedies for Upper Respiratory Illness  Note: the following list assumes no pregnancy, normal liver & kidney function and no other drug interactions. Dr. Sheppard Coil has highlighted medications which are safe for you to use, but these may not be appropriate for everyone. Always ask a pharmacist or qualified medical provider if you have any questions!   Aches/Pains, Fever, Headache Acetaminophen (Tylenol) 500 mg tablets - take max 2 tablets (1000 mg) every 6 hours (4 times per day)  Ibuprofen (Motrin) 200 mg tablets - take max 4 tablets (800 mg) every  6 hours*  Sinus Congestion Nasal Saline if desired to rinse (Flonase and similar aren't too helpful for acute illness)  Oxymetolazone (Afrin, others) sparing use no more than 3 days due to possibility of rebound congestion, NEVER use in kids Phenylephrine (Sudafed) 10 mg tablets every 4 hours (or the 12-hour formulation)* Diphenhydramine (Benadryl) 25 mg tablets - take max 2 tablets every 4 hours  Cough & Sore Throat Prescription steroids commonly used to help postviral cough Prescription cough pills or syrups as directed Dextromethorphan (Robitussin, others) - cough suppressant Guaifenesin (Robitussin, Mucinex, others) - expectorant (helps cough up mucus) (Dextromethorphan and Guaifenesin also come in a  combination tablet) Lozenges w/ Benzocaine + Menthol (Cepacol) Honey - as much as you want! Teas which "coat the throat" - look for ingredients Elm Bark, Licorice Root, Marshmallow Root  Other Antibiotics if these are needed (if steroids and cough meds aren't working after 2-3 days) - take ALL, even if you're feeling better  Zinc Lozenges within 24 hours of symptoms onset - mixed evidence this shortens the duration of the common cold Don't waste your money on Vitamin C or Echinacea  *Caution in patients with high blood pressure    Instructions sent via MyChart. If MyChart not available, pt was given option for info via personal e-mail w/ no guarantee of protected health info over unsecured e-mail communication, and MyChart sign-up instructions were sent to patient.   Follow Up Instructions: Return if symptoms worsen or fail to improve.    I discussed the assessment and treatment plan with the patient. The patient was provided an opportunity to ask questions and all were answered. The patient agreed with the plan and demonstrated an understanding of the instructions.   The patient was advised to call back or seek an in-person evaluation if any new concerns, if symptoms worsen or if the condition fails to improve as anticipated.  30 minutes of non-face-to-face time was provided during this encounter.      . . . . . . . . . . . . . Marland Kitchen                   Historical information moved to improve visibility of documentation.  Past Medical History:  Diagnosis Date  . Acid reflux   . Avascular necrosis ([place]) 08/2018   Right hip  . Complication of anesthesia    needs glide scope because trachea sit to the side not in the middle after SI surgery did not have a voice  . Depression   . Fatty liver 04/2017   noted on CT Chest  . Fibromyalgia   . GAD (generalized anxiety disorder) 02/10/2017  . Hypertension 02/10/2017  . Lung nodule 04/2017   Stable 4 mm  nodule in the periphery of the lateral segment , right, noted on CT Chest  . Palpitations 02/10/2017   a. 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   . Pneumonia 01/2018  . Raynaud's syndrome   . Splenomegaly 04/2017   noted on CT Chest  . SVT (supraventricular tachycardia) ([place])   . Systolic murmur   . Tendinopathy of rotator cuff    Right  . Tobacco use    Past Surgical History:  Procedure Laterality Date  . SACRO-ILIAC PINNING  2016   fusion, in [place]  . TONSILLECTOMY     age 29  . TOTAL HIP ARTHROPLASTY Right 12/05/2018   Procedure: TOTAL HIP ARTHROPLASTY ANTERIOR APPROACH;  Surgeon: Frederik Pear, MD;  Location: [facility];  Service: Orthopedics;  Laterality: Right;   Social History   Tobacco Use  . Smoking status: Current Every Day Smoker    Packs/day: 0.50    Years: 22.00    Pack years: 11.00    Types: Cigarettes, E-cigarettes  . Smokeless tobacco: Never Used  Substance Use Topics  . Alcohol use: Yes   family history includes Arrhythmia in her sister; Cancer in her maternal grandfather and mother; Depression in her mother; Diabetes in her father and paternal grandfather; Hypertension in her mother.  Medications: Current Outpatient Medications  Medication Sig Dispense Refill  . alendronate (FOSAMAX) 70 MG tablet TAKE 1 TABLET BY MOUTH ONCE EVERY WEEK 12 tablet 3  . azithromycin (ZITHROMAX) 250 MG tablet 2 tabs po x1 on Day 1, then 1 tab po daily on Days 2 - 5 6 tablet 0  . cyclobenzaprine (FLEXERIL) 10 MG tablet TAKE 1/2 TO 1 TABLET (5-10 MG TOTAL) BY MOUTH 3 (THREE) TIMES DAILY AS NEEDED FOR MUSCLE SPASMS. 90 tablet 0  . guaiFENesin-codeine (ROBITUSSIN AC) 100-10 MG/5ML syrup Take 5-10 mLs by mouth 4 (four) times daily as needed for cough. 180 mL 0  . HYDROcodone-acetaminophen (NORCO/VICODIN) 5-325 MG tablet Take 1-2 tablets by mouth every 8 (eight) hours as needed for moderate pain or severe pain. 30 tablet 0  . ibuprofen (ADVIL) 600 MG tablet  Take 1 tablet (600 mg total) by mouth every 6 (six) hours as needed. 30 tablet 0  . metoprolol succinate (TOPROL-XL) 50 MG 24 hr tablet TAKE 1 TABLET BY MOUTH ONCE A DAY WITH A MEAL OR IMMEDIATELY AFTER. 90 tablet 3  . omeprazole (PRILOSEC) 10 MG capsule Take 10 mg by mouth daily.    . predniSONE (DELTASONE) 20 MG tablet Take 1 tablet (20 mg total) by mouth 2 (two) times daily with a meal. 10 tablet 0  . sertraline (ZOLOFT) 50 MG tablet Take 1 tablet (50 mg total) by mouth daily. 90 tablet 3  . tiZANidine (ZANAFLEX) 4 MG tablet Take 1 tablet (4 mg total) by mouth every 8 (eight) hours as needed for muscle spasms. 30 tablet 0  . Vitamin D, Ergocalciferol, (DRISDOL) 1.25 MG (50000 UT) CAPS capsule TAKE 1 CAPSULE (50,000 UNITS TOTAL) BY MOUTH EVERY 7 (SEVEN) DAYS. FOR 12 WEEKS 12 capsule 0   No current facility-administered medications for this visit.   Allergies  Allergen Reactions  . Fish Allergy Nausea And Vomiting    ALL SEAFOOD  . Shellfish Allergy Nausea And Vomiting    ALL SEAFOOD  . Sulfa Antibiotics Other (See Comments)    Thrush reaction Sores in mouth

## 2020-02-21 NOTE — Patient Instructions (Signed)
Medications & Home Remedies for Upper Respiratory Illness  Note: the following list assumes no pregnancy, normal liver & kidney function and no other drug interactions. Dr. Sheppard Coil has highlighted medications which are safe for you to use, but these may not be appropriate for everyone. Always ask a pharmacist or qualified medical provider if you have any questions!   Aches/Pains, Fever, Headache Acetaminophen (Tylenol) 500 mg tablets - take max 2 tablets (1000 mg) every 6 hours (4 times per day)  Ibuprofen (Motrin) 200 mg tablets - take max 4 tablets (800 mg) every 6 hours*  Sinus Congestion Nasal Saline if desired to rinse (Flonase and similar aren't too helpful for acute illness)  Oxymetolazone (Afrin, others) sparing use no more than 3 days due to possibility of rebound congestion, NEVER use in kids Phenylephrine (Sudafed) 10 mg tablets every 4 hours (or the 12-hour formulation)* Diphenhydramine (Benadryl) 25 mg tablets - take max 2 tablets every 4 hours  Cough & Sore Throat Prescription steroids commonly used to help postviral cough Prescription cough pills or syrups as directed Dextromethorphan (Robitussin, others) - cough suppressant Guaifenesin (Robitussin, Mucinex, others) - expectorant (helps cough up mucus) (Dextromethorphan and Guaifenesin also come in a combination tablet) Lozenges w/ Benzocaine + Menthol (Cepacol) Honey - as much as you want! Teas which "coat the throat" - look for ingredients Elm Bark, Licorice Root, Marshmallow Root  Other Antibiotics if these are needed (if steroids and cough meds aren't working after 2-3 days) - take ALL, even if you're feeling better  Zinc Lozenges within 24 hours of symptoms onset - mixed evidence this shortens the duration of the common cold Don't waste your money on Vitamin C or Echinacea  *Caution in patients with high blood pressure

## 2020-03-01 ENCOUNTER — Encounter: Payer: Self-pay | Admitting: Osteopathic Medicine

## 2020-03-01 DIAGNOSIS — M25519 Pain in unspecified shoulder: Secondary | ICD-10-CM

## 2020-03-02 NOTE — Telephone Encounter (Signed)
Deborah Goodman I think you are going to have to refer this patient to me for further shoulder work-up and treatment, she has the focus plan per our my chart notes.  Sigh.Marland KitchenMarland Kitchen

## 2020-03-04 ENCOUNTER — Ambulatory Visit: Payer: No Typology Code available for payment source | Admitting: Sports Medicine

## 2020-03-05 ENCOUNTER — Ambulatory Visit: Payer: No Typology Code available for payment source | Admitting: Sports Medicine

## 2020-03-07 ENCOUNTER — Encounter: Payer: Self-pay | Admitting: Osteopathic Medicine

## 2020-03-07 DIAGNOSIS — Z8049 Family history of malignant neoplasm of other genital organs: Secondary | ICD-10-CM

## 2020-03-09 ENCOUNTER — Encounter: Payer: Self-pay | Admitting: Osteopathic Medicine

## 2020-03-09 ENCOUNTER — Encounter: Payer: Self-pay | Admitting: Sports Medicine

## 2020-03-09 ENCOUNTER — Other Ambulatory Visit: Payer: Self-pay

## 2020-03-09 ENCOUNTER — Ambulatory Visit (INDEPENDENT_AMBULATORY_CARE_PROVIDER_SITE_OTHER): Payer: No Typology Code available for payment source | Admitting: Sports Medicine

## 2020-03-09 ENCOUNTER — Ambulatory Visit: Payer: No Typology Code available for payment source | Admitting: Sports Medicine

## 2020-03-09 ENCOUNTER — Ambulatory Visit (INDEPENDENT_AMBULATORY_CARE_PROVIDER_SITE_OTHER): Payer: No Typology Code available for payment source

## 2020-03-09 DIAGNOSIS — M25511 Pain in right shoulder: Secondary | ICD-10-CM | POA: Diagnosis not present

## 2020-03-09 DIAGNOSIS — S99921A Unspecified injury of right foot, initial encounter: Secondary | ICD-10-CM | POA: Diagnosis not present

## 2020-03-09 DIAGNOSIS — G8929 Other chronic pain: Secondary | ICD-10-CM

## 2020-03-09 NOTE — Progress Notes (Signed)
    Procedures performed today:    Procedure: Real-time Ultrasound Guided injection of the right subacromial bursa Device: Samsung HS60  Verbal informed consent obtained.  Time-out conducted.  Noted no overlying erythema, induration, or other signs of local infection.  Skin prepped in a sterile fashion.  Local anesthesia: Topical Ethyl chloride.  With sterile technique and under real time ultrasound guidance: 1 cc Kenalog 40, 1 cc lidocaine, 1 cc bupivacaine injected easily Completed without difficulty  Pain immediately resolved suggesting accurate placement of the medication.  Advised to call if fevers/chills, erythema, induration, drainage, or persistent bleeding.  Images permanently stored and available for review in the ultrasound unit.  Impression: Technically successful ultrasound guided injection.  Independent interpretation of notes and tests performed by another provider:   None.  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 Now having recurrence of pain over the deltoid, repeat subacromial injection today. Return as needed, continue rehab exercises.  Injury of right toe Dropped an x-ray cassette on the right second toe, pain, swelling at the distal phalanx. X-rays. I have encouraged her to buddy tape the second and third toes together.    ___________________________________________ Gwen Her. Dianah Field, M.D., ABFM., CAQSM. Primary Care and [place] Instructor of [place] [facility]

## 2020-03-09 NOTE — Assessment & Plan Note (Signed)
Dropped an x-ray cassette on the right second toe, pain, swelling at the distal phalanx. X-rays. I have encouraged her to buddy tape the second and third toes together.

## 2020-03-09 NOTE — Assessment & Plan Note (Signed)
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 Now having recurrence of pain over the deltoid, repeat subacromial injection today. Return as needed, continue rehab exercises.

## 2020-03-23 ENCOUNTER — Encounter: Payer: Self-pay | Admitting: Osteopathic Medicine

## 2020-03-23 DIAGNOSIS — Z0100 Encounter for examination of eyes and vision without abnormal findings: Secondary | ICD-10-CM

## 2020-03-23 DIAGNOSIS — N920 Excessive and frequent menstruation with regular cycle: Secondary | ICD-10-CM

## 2020-04-06 ENCOUNTER — Telehealth (INDEPENDENT_AMBULATORY_CARE_PROVIDER_SITE_OTHER): Payer: No Typology Code available for payment source | Admitting: Nurse Practitioner

## 2020-04-06 ENCOUNTER — Encounter: Payer: Self-pay | Admitting: Nurse Practitioner

## 2020-04-06 DIAGNOSIS — H60502 Unspecified acute noninfective otitis externa, left ear: Secondary | ICD-10-CM | POA: Diagnosis not present

## 2020-04-06 DIAGNOSIS — H6690 Otitis media, unspecified, unspecified ear: Secondary | ICD-10-CM | POA: Diagnosis not present

## 2020-04-06 MED ORDER — AMOXICILLIN-POT CLAVULANATE 875-125 MG PO TABS: 1.0000 | ORAL_TABLET | Freq: Two times a day (BID) | ORAL | 0 refills | Status: DC

## 2020-04-06 MED ORDER — CIPROFLOXACIN-DEXAMETHASONE 0.3-0.1 % OT SUSP: 4.0000 [drp] | Freq: Two times a day (BID) | OTIC | 0 refills | Status: DC

## 2020-04-06 NOTE — Progress Notes (Signed)
Virtual Visit via MyChart Note  I connected with  Deborah Goodman on 04/06/20 at  9:30 AM EDT by the video enabled telemedicine application, MyChart, and verified that I am speaking with the correct person using two identifiers.   I introduced myself as a Designer, jewellery with the practice. We discussed the limitations of evaluation and management by telemedicine and the availability of in person appointments. The patient expressed understanding and agreed to proceed.  The patient is: at home I am: in the office  Subjective:    CC: Ear pain  HPI: Deborah Goodman is a 44 y.o. y/o female presenting via [place] today for left ear pain that started about a week ago. She reports progressive pain in her ear to the point that she is unable to sleep or use a phone on her left side for the past week. She also reports pain when inserting a q-tip inside the ear canal.  She does have an issue with allergies in the fall, but so far this spring she denies any allergy issues.   She endorses fever, eustacian tube tenderness, sore throat, fever, dizziness (mostly when changing positions), and internal and external ear pain.   She denies ringing in the ears, decreased hearing, ,sinus pain or pressure. cough, headache, redness to the throat, drainage from the ear, or tragus pain.  She has not tried anything to help with the symptoms. Nothing seems to make it better and it is progressively worsening on its own.   Past medical history, Surgical history, Family history not pertinant except as noted below, Social history, Allergies, and medications have been entered into the medical record, reviewed, and corrections made.   Review of Systems:  See HPI   Objective:    General: Speaking clearly in complete sentences without any shortness of breath.  Alert and oriented x3.  Normal judgment. No apparent acute distress.   Impression and Recommendations:    1. Acute otitis media, unspecified otitis media  type Symptoms and presentation consistent with acute otitis media of the left ear.  Unable to visualize the ear canal or the tympanic membrane due to virtual visit however will treat empirically with Augmentin for 7 days. Patient instructed that she should begin feeling better within 5 days and to notify the office if her symptoms worsen or fail to improve. Patient instructed to monitor for drainage from the ear, worsening pain, or worsening fever. Patient instructed to utilize warm compresses to the ear as needed in addition to ibuprofen for pain. We will most likely need to visualize the ear if symptoms are not resolved. - amoxicillin-clavulanate (AUGMENTIN) 875-125 MG tablet; Take 1 tablet by mouth 2 (two) times daily.  Dispense: 14 tablet; Refill: 0  2. Acute otitis externa of left ear, unspecified type Symptoms and presentation also consistent with acute otitis externa as evidenced by tenderness within the auditory canal.  Unable to visualize the auditory canal or tympanic membrane due to virtual visit however will treat empirically with ciprofloxacin/dexamethasone otic suspension to the left ear for 7 days. Patient instructed to instill 4 drops into the auditory canal and allow medication to rest 10 to 15 minutes before moving.  [place] may be placed into the opening of the auditory canal to allow the medication to remain in place. Patient may use ibuprofen and warm compresses to the area to aid in pain. Patient instructed if she is not feeling better within 5 days or if symptoms worsen to contact the office for further evaluation.  We will most likely need to visualize the ear if symptoms are not resolved. - ciprofloxacin-dexamethasone (CIPRODEX) OTIC suspension; Place 4 drops into the left ear 2 (two) times daily. Use for 7 days.  Dispense: 7.5 mL; Refill: 0    I discussed the assessment and treatment plan with the patient. The patient was provided an opportunity to ask questions and all  were answered. The patient agreed with the plan and demonstrated an understanding of the instructions.   The patient was advised to call back or seek an in-person evaluation if the symptoms worsen or if the condition fails to improve as anticipated.  I provided 13 minutes of non-face-to-face interaction with this [place] visit.   Orma Render, NP

## 2020-04-06 NOTE — Patient Instructions (Signed)
Otitis Media, Adult  Otitis media means that the middle ear is red and swollen (inflamed) and full of fluid. The condition usually goes away on its own. Follow these instructions at home:  Take over-the-counter and prescription medicines only as told by your doctor.  If you were prescribed an antibiotic medicine, take it as told by your doctor. Do not stop taking the antibiotic even if you start to feel better.  Keep all follow-up visits as told by your doctor. This is important. Contact a doctor if:  You have bleeding from your nose.  There is a lump on your neck.  You are not getting better in 5 days.  You feel worse instead of better. Get help right away if:  You have pain that is not helped with medicine.  You have swelling, redness, or pain around your ear.  You get a stiff neck.  You cannot move part of your face (paralyzed).  You notice that the bone behind your ear hurts when you touch it.  You get a very bad headache. Summary  Otitis media means that the middle ear is red, swollen, and full of fluid.  This condition usually goes away on its own. In some cases, treatment may be needed.  If you were prescribed an antibiotic medicine, take it as told by your doctor. This information is not intended to replace advice given to you by your health care provider. Make sure you discuss any questions you have with your health care provider. Document Revised: 11/17/2017 Document Reviewed: 12/26/2016 Elsevier Patient Education  2020 [place].   Otitis Externa  Otitis externa is an infection of the outer ear canal. The outer ear canal is the area between the outside of the ear and the eardrum. Otitis externa is sometimes called swimmer's ear. What are the causes? Common causes of this condition include:  Swimming in dirty water.  Moisture in the ear.  An injury to the inside of the ear.  An object stuck in the ear.  A cut or scrape on the outside of the  ear. What increases the risk? You are more likely to get this condition if you go swimming often. What are the signs or symptoms?  Itching in the ear. This is often the first symptom.  Swelling of the ear.  Redness in the ear.  Ear pain. The pain may get worse when you pull on your ear.  Pus coming from the ear. How is this treated? This condition may be treated with:  Antibiotic ear drops. These are often given for 10-14 days.  Medicines to reduce itching and swelling. Follow these instructions at home:  If you were given antibiotic ear drops, use them as told by your doctor. Do not stop using them even if your condition gets better.  Take over-the-counter and prescription medicines only as told by your doctor.  Avoid getting water in your ears as told by your doctor. You may be told to avoid swimming or water sports for a few days.  Keep all follow-up visits as told by your doctor. This is important. How is this prevented?  Keep your ears dry. Use the corner of a towel to dry your ears after you swim or bathe.  Try not to scratch or put things in your ear. Doing these things makes it easier for germs to grow in your ear.  Avoid swimming in lakes, dirty water, or pools that may not have the right amount of a chemical called chlorine.  Contact a doctor if:  You have a fever.  Your ear is still red, swollen, or painful after 3 days.  You still have pus coming from your ear after 3 days.  Your redness, swelling, or pain gets worse.  You have a really bad headache.  You have redness, swelling, pain, or tenderness behind your ear. Summary  Otitis externa is an infection of the outer ear canal.  Symptoms include pain, redness, and swelling of the ear.  If you were given antibiotic ear drops, use them as told by your doctor. Do not stop using them even if your condition gets better.  Try not to scratch or put things in your ear. This information is not intended to  replace advice given to you by your health care provider. Make sure you discuss any questions you have with your health care provider. Document Revised: 05/11/2018 Document Reviewed: 05/11/2018 Elsevier Patient Education  [place].

## 2020-05-26 DIAGNOSIS — G8929 Other chronic pain: Secondary | ICD-10-CM

## 2020-05-26 NOTE — Telephone Encounter (Signed)
Please send to the schedulers.

## 2020-06-07 ENCOUNTER — Other Ambulatory Visit: Payer: Self-pay

## 2020-06-07 ENCOUNTER — Ambulatory Visit (INDEPENDENT_AMBULATORY_CARE_PROVIDER_SITE_OTHER): Payer: No Typology Code available for payment source

## 2020-06-07 ENCOUNTER — Other Ambulatory Visit: Payer: Self-pay | Admitting: Osteopathic Medicine

## 2020-06-07 DIAGNOSIS — G8929 Other chronic pain: Secondary | ICD-10-CM

## 2020-06-07 DIAGNOSIS — M25511 Pain in right shoulder: Secondary | ICD-10-CM

## 2020-06-08 ENCOUNTER — Encounter (INDEPENDENT_AMBULATORY_CARE_PROVIDER_SITE_OTHER): Payer: No Typology Code available for payment source

## 2020-06-08 DIAGNOSIS — M25511 Pain in right shoulder: Secondary | ICD-10-CM | POA: Diagnosis not present

## 2020-06-08 DIAGNOSIS — G8929 Other chronic pain: Secondary | ICD-10-CM

## 2020-06-09 NOTE — Telephone Encounter (Signed)
I spent 5 total minutes of online digital evaluation and management services. 

## 2020-06-09 NOTE — Assessment & Plan Note (Signed)
Full-thickness retracted supraspinatus tear, failed conservative treatment, referral to Dr. Griffin Basil.

## 2020-06-24 NOTE — Patient Instructions (Signed)
DUE TO COVID-19 ONLY ONE VISITOR IS ALLOWED TO COME WITH YOU AND STAY IN THE WAITING ROOM ONLY DURING PRE OP AND PROCEDURE DAY OF SURGERY. THE 2 VISITOR MAY VISIT WITH YOU AFTER SURGERY IN YOUR PRIVATE ROOM DURING VISITING HOURS ONLY!  YOU NEED TO HAVE A COVID 19 TEST ON_7/10______ @_______ , THIS TEST MUST BE DONE BEFORE SURGERY, COME  [place] , [id number].  ([place]) ONCE YOUR COVID TEST IS COMPLETED, PLEASE BEGIN THE QUARANTINE INSTRUCTIONS AS OUTLINED IN YOUR HANDOUT.                Deborah Goodman    Your procedure is scheduled on: 07/01/20   Report to [facility] Main  Entrance   Report to admitting at   6:30 AM     Call this number if you have problems the morning of surgery [place], NO CHEWING GUM [place].   Do not eat food After Midnight.   YOU MAY HAVE CLEAR LIQUIDS FROM MIDNIGHT UNTIL 5:30 AM.   At 5:30 AM Please finish the prescribed Pre-Surgery  drink  . Nothing by mouth after you finish the  drink !    Take these medicines the morning of surgery with A SIP OF WATER:  Zoloft, Metoprolol, Omeprizole                                 You may not have any metal on your body including hair pins and              piercings  Do not wear jewelry, make-up, lotions, powders or perfumes, deodorant             Do not wear nail polish on your fingernails.  Do not shave  48 hours prior to surgery.               Do not bring valuables to the hospital. [place].  Contacts, dentures or bridgework may not be worn into surgery.      Patients discharged the day of surgery will not be allowed to drive home.  IF YOU ARE HAVING SURGERY AND GOING HOME THE SAME DAY, YOU MUST HAVE AN ADULT TO DRIVE YOU HOME AND BE WITH YOU FOR 24 HOURS.   YOU MAY GO HOME BY TAXI OR UBER OR ORTHERWISE, BUT AN ADULT MUST  ACCOMPANY YOU HOME AND STAY WITH YOU FOR 24 HOURS.  Name and phone number of your driver:  Special Instructions: N/A              Please read over the following fact sheets you were given: _____________________________________________________________________             [facility] - Preparing for Surgery Before surgery, you can play an important role.   Because skin is not sterile, your skin needs to be as free of germs as possible .  You can reduce the number of germs on your skin by washing with CHG (chlorahexidine gluconate) soap before surgery .  CHG is an antiseptic cleaner which kills germs and bonds with the skin to continue killing germs even after washing. Please DO NOT use if you have an allergy to CHG  or antibacterial soaps.   If your skin becomes reddened/irritated stop using the CHG and inform your nurse when you arrive at Short Stay. Do not shave (including legs and underarms) for at least 48 hours prior to the first CHG shower.    Please follow these instructions carefully:  1.  Shower with CHG Soap the night before surgery and the  morning of Surgery.  2.  If you choose to wash your hair, wash your hair first as usual with your  normal  shampoo.  3.  After you shampoo, rinse your hair and body thoroughly to remove the  shampoo.                                        4.  Use CHG as you would any other liquid soap.  You can apply chg directly  to the skin and wash                       Gently with a scrungie or clean washcloth.  5.  Apply the CHG Soap to your body ONLY FROM THE NECK DOWN.   Do not use on face/ open                           Wound or open sores. Avoid contact with eyes, ears mouth and genitals (private parts).                       Wash face,  Genitals (private parts) with your normal soap.             6.  Wash thoroughly, paying special attention to the area where your surgery  will be performed.  7.  Thoroughly rinse your body with warm water from the  neck down.  8.  DO NOT shower/wash with your normal soap after using and rinsing off  the CHG Soap.             9.  Pat yourself dry with a clean towel.            10.  Wear clean pajamas.            11.  Place clean sheets on your bed the night of your first shower and do not  sleep with pets. Day of Surgery : Do not apply any lotions/deodorants the morning of surgery.  Please wear clean clothes to the hospital/surgery center.  FAILURE TO FOLLOW THESE INSTRUCTIONS MAY RESULT IN THE CANCELLATION OF YOUR SURGERY PATIENT SIGNATURE_________________________________  NURSE SIGNATURE__________________________________  ________________________________________________________________________

## 2020-06-25 ENCOUNTER — Inpatient Hospital Stay (HOSPITAL_COMMUNITY)
Admission: RE | Admit: 2020-06-25 | Discharge: 2020-06-25 | Disposition: A | Discharge status: Home / Self Care | Payer: No Typology Code available for payment source | Source: Ambulatory Visit

## 2020-07-07 NOTE — Progress Notes (Addendum)
COVID Vaccine Completed: Date COVID Vaccine completed: COVID vaccine manufacturer: [employer]   PCP - Emeterio Reeve, DO Cardiologist - N/A No stimulator in back  Chest x-ray - 01-17-20 CT  EKG - 01-06-20 reviewed by Konrad Felix, PA Stress Test -  ECHO -  Cardiac Cath -   Sleep Study -  CPAP -   Fasting Blood Sugar -  Checks Blood Sugar _____ times a day  Blood Thinner Instructions: Aspirin Instructions: Last Dose:   ADL w/o SOB  Anesthesia review:   Patient denies shortness of breath, fever, cough and chest pain at PAT appointment   Patient verbalized understanding of instructions that were given to them at the PAT appointment. Patient was also instructed that they will need to review over the PAT instructions again at home before surgery.

## 2020-07-07 NOTE — Patient Instructions (Addendum)
DUE TO COVID-19 ONLY ONE VISITOR IS ALLOWED TO COME WITH YOU AND STAY IN THE WAITING ROOM ONLY DURING PRE OP AND PROCEDURE DAY OF SURGERY. THE 1 VISITOR MAY VISIT WITH YOU AFTER SURGERY IN YOUR PRIVATE ROOM DURING VISITING HOURS ONLY!  YOU NEED TO HAVE A COVID 19 TEST ON 07-11-20 @ 11:05 AM, THIS TEST MUST BE DONE BEFORE SURGERY, COME  [place], [place] , [id number].  ([place]) ONCE YOUR COVID TEST IS COMPLETED, PLEASE BEGIN THE QUARANTINE INSTRUCTIONS AS OUTLINED IN YOUR HANDOUT.                Deborah Goodman  07/07/2020   Your procedure is scheduled on: 07-15-20   Report to [facility] Main  Entrance    Report to Short Stay at 5:30 AM     Call this number if you have problems the morning of surgery [phone]    Remember: AFTER MIDNIGHT THE NIGHT PRIOR TO SURGERY. NOTHING BY MOUTH EXCEPT CLEAR LIQUIDS UNTIL 4:30 AM . PLEASE FINISH ENSURE DRINK PER SURGEON ORDER  WHICH NEEDS TO BE COMPLETED AT 4:30 AM.   CLEAR LIQUID DIET   Foods Allowed                                                                     Foods Excluded  Coffee and tea, regular and decaf                             liquids that you cannot  Plain Jell-O any favor except red or purple                                           see through such as: Fruit ices (not with fruit pulp)                                     milk, soups, orange juice  Iced Popsicles                                    All solid food Carbonated beverages, regular and diet                                    Cranberry, grape and apple juices Sports drinks like Gatorade Lightly seasoned clear broth or consume(fat free) Sugar, honey syrup   _____________________________________________________________________      Take these medicines the morning of surgery with A SIP OF WATER: None   BRUSH YOUR TEETH MORNING OF SURGERY AND RINSE YOUR MOUTH OUT, NO CHEWING GUM [place] OR MINTS.                                 You may not have any metal on your body including hair pins and  piercings     Do not wear jewelry, make-up, lotions, powders or perfumes, deodorant              Do not wear nail polish on your fingernails.  Do not shave  48 hours prior to surgery.              Men may shave face and neck.   Do not bring valuables to the hospital. [place].  Contacts, dentures or bridgework may not be worn into surgery.      Patients discharged the day of surgery will not be allowed to drive home. IF YOU ARE HAVING SURGERY AND GOING HOME THE SAME DAY, YOU MUST HAVE AN ADULT TO DRIVE YOU HOME AND BE WITH YOU FOR 24 HOURS. YOU MAY GO HOME BY TAXI OR UBER OR ORTHERWISE, BUT AN ADULT MUST ACCOMPANY YOU HOME AND STAY WITH YOU FOR 24 HOURS.  Name and phone number of your driver:Deborah Goodman [phone]  Special Instructions: N/A              Please read over the following fact sheets you were given: _____________________________________________________________________             [facility] - Preparing for Surgery Before surgery, you can play an important role.  Because skin is not sterile, your skin needs to be as free of germs as possible.  You can reduce the number of germs on your skin by washing with CHG (chlorahexidine gluconate) soap before surgery.  CHG is an antiseptic cleaner which kills germs and bonds with the skin to continue killing germs even after washing. Please DO NOT use if you have an allergy to CHG or antibacterial soaps.  If your skin becomes reddened/irritated stop using the CHG and inform your nurse when you arrive at Short Stay. Do not shave (including legs and underarms) for at least 48 hours prior to the first CHG shower.  You may shave your face/neck. Please follow these instructions carefully:  1.  Shower with CHG Soap the night before surgery and the  morning of Surgery.  2.  If you choose to wash your hair, wash  your hair first as usual with your  normal  shampoo.  3.  After you shampoo, rinse your hair and body thoroughly to remove the  shampoo.                           4.  Use CHG as you would any other liquid soap.  You can apply chg directly  to the skin and wash                       Gently with a scrungie or clean washcloth.  5.  Apply the CHG Soap to your body ONLY FROM THE NECK DOWN.   Do not use on face/ open                           Wound or open sores. Avoid contact with eyes, ears mouth and genitals (private parts).                       Wash face,  Genitals (private parts) with your normal soap.  6.  Wash thoroughly, paying special attention to the area where your surgery  will be performed.  7.  Thoroughly rinse your body with warm water from the neck down.  8.  DO NOT shower/wash with your normal soap after using and rinsing off  the CHG Soap.                9.  Pat yourself dry with a clean towel.            10.  Wear clean pajamas.            11.  Place clean sheets on your bed the night of your first shower and do not  sleep with pets. Day of Surgery : Do not apply any lotions/deodorants the morning of surgery.  Please wear clean clothes to the hospital/surgery center.  FAILURE TO FOLLOW THESE INSTRUCTIONS MAY RESULT IN THE CANCELLATION OF YOUR SURGERY PATIENT SIGNATURE_________________________________  NURSE SIGNATURE__________________________________  ________________________________________________________________________

## 2020-07-10 ENCOUNTER — Other Ambulatory Visit: Payer: Self-pay

## 2020-07-10 ENCOUNTER — Encounter (HOSPITAL_COMMUNITY)
Admission: RE | Admit: 2020-07-10 | Discharge: 2020-07-10 | Disposition: A | Discharge status: Home / Self Care | Payer: No Typology Code available for payment source | Source: Ambulatory Visit | Attending: Orthopaedic Surgery | Admitting: Orthopaedic Surgery

## 2020-07-10 ENCOUNTER — Encounter (HOSPITAL_COMMUNITY): Payer: Self-pay

## 2020-07-10 DIAGNOSIS — M797 Fibromyalgia: Secondary | ICD-10-CM | POA: Insufficient documentation

## 2020-07-10 DIAGNOSIS — Z7901 Long term (current) use of anticoagulants: Secondary | ICD-10-CM | POA: Diagnosis not present

## 2020-07-10 DIAGNOSIS — F17210 Nicotine dependence, cigarettes, uncomplicated: Secondary | ICD-10-CM | POA: Insufficient documentation

## 2020-07-10 DIAGNOSIS — M75101 Unspecified rotator cuff tear or rupture of right shoulder, not specified as traumatic: Secondary | ICD-10-CM | POA: Diagnosis not present

## 2020-07-10 DIAGNOSIS — Z01818 Encounter for other preprocedural examination: Secondary | ICD-10-CM | POA: Diagnosis present

## 2020-07-10 DIAGNOSIS — I10 Essential (primary) hypertension: Secondary | ICD-10-CM | POA: Insufficient documentation

## 2020-07-10 DIAGNOSIS — R011 Cardiac murmur, unspecified: Secondary | ICD-10-CM | POA: Diagnosis not present

## 2020-07-10 DIAGNOSIS — I7300 Raynaud's syndrome without gangrene: Secondary | ICD-10-CM | POA: Insufficient documentation

## 2020-07-10 DIAGNOSIS — F329 Major depressive disorder, single episode, unspecified: Secondary | ICD-10-CM | POA: Insufficient documentation

## 2020-07-10 DIAGNOSIS — F411 Generalized anxiety disorder: Secondary | ICD-10-CM | POA: Diagnosis not present

## 2020-07-10 DIAGNOSIS — Z79899 Other long term (current) drug therapy: Secondary | ICD-10-CM | POA: Insufficient documentation

## 2020-07-10 DIAGNOSIS — K760 Fatty (change of) liver, not elsewhere classified: Secondary | ICD-10-CM | POA: Diagnosis not present

## 2020-07-10 LAB — BASIC METABOLIC PANEL
Anion gap: 7 (ref 5–15)
BUN: 7 mg/dL (ref 6–20)
CO2: 26 mmol/L (ref 22–32)
Calcium: 8.7 mg/dL — ABNORMAL LOW (ref 8.9–10.3)
Chloride: 104 mmol/L (ref 98–111)
Creatinine, Ser: 0.62 mg/dL (ref 0.44–1.00)
GFR calc Af Amer: >60 mL/min (ref >60)
GFR calc non Af Amer: >60 mL/min (ref >60)
Glucose, Bld: 90 mg/dL (ref 70–99)
Potassium: 4 mmol/L (ref 3.5–5.1)
Sodium: 137 mmol/L (ref 135–145)

## 2020-07-10 LAB — CBC
HCT: 42.9 % (ref 36.0–46.0)
Hemoglobin: 14.5 g/dL (ref 12.0–15.0)
MCH: 32 pg (ref 26.0–34.0)
MCHC: 33.8 g/dL (ref 30.0–36.0)
MCV: 94.7 fL (ref 80.0–100.0)
Platelets: 249 10*3/uL (ref 150–400)
RBC: 4.53 MIL/uL (ref 3.87–5.11)
RDW: 12.5 % (ref 11.5–15.5)
WBC: 7.4 10*3/uL (ref 4.0–10.5)
nRBC: 0 % (ref 0.0–0.2)

## 2020-07-10 NOTE — Anesthesia Preprocedure Evaluation (Addendum)
Anesthesia Evaluation  Patient identified by MRN, date of birth, ID band Patient awake    Reviewed: Allergy & Precautions, NPO status , Patient's Chart, lab work & pertinent test results, reviewed documented beta blocker date and time   Airway Mallampati: III  TM Distance: >3 FB Neck ROM: Full    Dental no notable dental hx.    Pulmonary Current SmokerPatient did not abstain from smoking.,    Pulmonary exam normal breath sounds clear to auscultation       Cardiovascular hypertension, Pt. on home beta blockers Normal cardiovascular exam+ Valvular Problems/Murmurs  Rhythm:Regular Rate:Normal  ECG: NSR, rate 70   Neuro/Psych PSYCHIATRIC DISORDERS Anxiety Depression negative neurological ROS     GI/Hepatic Neg liver ROS, GERD  Controlled and Medicated,  Endo/Other  negative endocrine ROS  Renal/GU negative Renal ROS     Musculoskeletal  (+) Fibromyalgia -Raynaud's syndrome   Abdominal (+) + obese,   Peds  Hematology negative hematology ROS (+)   Anesthesia Other Findings RIGHT SHOULDER ROTATOR CUFF TEAR  Reproductive/Obstetrics                           Anesthesia Physical Anesthesia Plan  ASA: II  Anesthesia Plan: Regional and General   Post-op Pain Management: GA combined w/ Regional for post-op pain   Induction: Intravenous  PONV Risk Score and Plan: 2 and Ondansetron, Dexamethasone, Midazolam and Treatment may vary due to age or medical condition  Airway Management Planned: Oral ETT and Video Laryngoscope Planned  Additional Equipment:   Intra-op Plan:   Post-operative Plan: Extubation in OR  Informed Consent: I have reviewed the patients History and Physical, chart, labs and discussed the procedure including the risks, benefits and alternatives for the proposed anesthesia with the patient or authorized representative who has indicated his/her understanding and acceptance.      Dental advisory given  Plan Discussed with: CRNA  Anesthesia Plan Comments: (Reviewed PAT note 07/10/2020, Konrad Felix, PA-C)      Anesthesia Quick Evaluation

## 2020-07-10 NOTE — Progress Notes (Addendum)
Anesthesia Chart Review   Case: [id number] Date/Time: 07/15/20 0715   Procedure: SHOULDER ARTHROSCOPY WITH OPEN ROTATOR CUFF REPAIR AND DISTAL CLAVICLE ACROMINECTOMY (Right )   Anesthesia type: Choice   Pre-op diagnosis: RIGHT SHOULDER ROTATOR CUFF TEAR   Location: [facility] ROOM 06 / [facility]   Surgeons: [name], MD      DISCUSSION:44 y.o. current every day smoker (11 pack years) with h/o HTN, Raynaud's syndrome, PSVT on Toprol, right shoulder rotator cuff tear scheduled for above procedure 07/15/2020 with Dr. Ophelia Charter.    Pt states she was told in the past by anesthesia that glidescope should be used.  This was after a surgery in [place].  H/o total hip 29/51/88 with no complications, although spinal used for this surgery.  Discussed with Dr. Marcie Bal.  Anesthesia to evaluate DOS.  VS: BP (!) 138/91   Pulse 82   Temp 36.8 C (Oral)   Resp 16   Ht 5\' 6"  (1.676 m)   Wt 89.8 kg   LMP 06/13/2020 (Exact Date)   SpO2 100%   BMI 31.96 kg/m   PROVIDERS: Emeterio Reeve, DO is PCP    LABS: Labs reviewed: Acceptable for surgery. (all labs ordered are listed, but only abnormal results are displayed)  Labs Reviewed  BASIC METABOLIC PANEL - Abnormal; Notable for the following components:      Result Value   Calcium 8.7 (*)    All other components within normal limits  CBC     IMAGES:   EKG: 01/06/2020 Rate 70 bpm  NSR  CV: Echo 03/07/2017 Study Conclusions   - Left ventricle: The cavity size was normal. Systolic function was  normal. The estimated ejection fraction was in the range of 60%  to 65%. Wall motion was normal; there were no regional wall  motion abnormalities. Left ventricular diastolic function  parameters were normal.  - Tricuspid valve: There was trivial regurgitation. Past Medical History:  Diagnosis Date  . Acid reflux   . Avascular necrosis (Deborah Goodman) 08/2018   Right hip  . Complication of anesthesia    needs glide scope because trachea sit to the  side not in the middle after SI surgery did not have a voice  . Depression   . Fatty liver 04/2017   noted on CT Chest  . Fibromyalgia   . GAD (generalized anxiety disorder) 02/10/2017  . Hypertension 02/10/2017  . Lung nodule 04/2017   Stable 4 mm nodule in the periphery of the lateral segment , right, noted on CT Chest  . Palpitations 02/10/2017   a. 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   . Pneumonia 01/2018  . Raynaud's syndrome   . Splenomegaly 04/2017   noted on CT Chest  . SVT (supraventricular tachycardia) ([place])   . Systolic murmur   . Tendinopathy of rotator cuff    Right  . Tobacco use     Past Surgical History:  Procedure Laterality Date  . SACRO-ILIAC PINNING  2016   fusion, in [place]  . TONSILLECTOMY     age 30  . TOTAL HIP ARTHROPLASTY Right 12/05/2018   Procedure: TOTAL HIP ARTHROPLASTY ANTERIOR APPROACH;  Surgeon: Frederik Pear, MD;  Location: [facility];  Service: Orthopedics;  Laterality: Right;    MEDICATIONS: . alendronate (FOSAMAX) 70 MG tablet  . amoxicillin-clavulanate (AUGMENTIN) 875-125 MG tablet  . ciprofloxacin-dexamethasone (CIPRODEX) OTIC suspension  . cyclobenzaprine (FLEXERIL) 10 MG tablet  . diclofenac Sodium (VOLTAREN) 1 % GEL  . metoprolol  succinate (TOPROL-XL) 50 MG 24 hr tablet  . Multiple Vitamins-Minerals (MULTIVITAMIN GUMMIES ADULT PO)  . Omeprazole 20 MG TBEC  . sertraline (ZOLOFT) 50 MG tablet  . Vitamin D, Ergocalciferol, (DRISDOL) 1.25 MG (50000 UT) CAPS capsule   No current facility-administered medications for this encounter.     Deborah Felix, PA-C [facility] [phone]

## 2020-07-11 ENCOUNTER — Other Ambulatory Visit (HOSPITAL_COMMUNITY)
Admission: RE | Admit: 2020-07-11 | Discharge: 2020-07-11 | Disposition: A | Discharge status: Home / Self Care | Payer: No Typology Code available for payment source | Source: Ambulatory Visit | Attending: Orthopaedic Surgery | Admitting: Orthopaedic Surgery

## 2020-07-11 DIAGNOSIS — Z01812 Encounter for preprocedural laboratory examination: Secondary | ICD-10-CM | POA: Diagnosis present

## 2020-07-11 DIAGNOSIS — Z20822 Contact with and (suspected) exposure to covid-19: Secondary | ICD-10-CM | POA: Insufficient documentation

## 2020-07-11 LAB — SARS CORONAVIRUS 2 (TAT 6-24 HRS): SARS Coronavirus 2: NEGATIVE

## 2020-07-14 NOTE — H&P (Signed)
PREOPERATIVE H&P  Chief Complaint: RIGHT SHOULDER ROTATOR CUFF TEAR  HPI: Deborah Goodman is a 44 y.o. female who is scheduled for SHOULDER ARTHROSCOPY WITH OPEN ROTATOR CUFF REPAIR AND DISTAL CLAVICLE [place].   Patient has a past medical history significant for HTN, current every day smoker, Raynaud's syndrome, PSVT, right hip AVN treated with right total hip arthroplasty.   Patient has had over 2 years of pain in her right shoulder. She has tried PT and injections with no improvement.   Her symptoms are rated as moderate to severe, and have been worsening.  This is significantly impairing activities of daily living.    Please see clinic note for further details on this patient's care.    She has elected for surgical management.   Past Medical History:  Diagnosis Date  . Acid reflux   . Avascular necrosis ([place]) 08/2018   Right hip  . Complication of anesthesia    needs glide scope because trachea sit to the side not in the middle after SI surgery did not have a voice  . Depression   . Fatty liver 04/2017   noted on CT Chest  . Fibromyalgia   . GAD (generalized anxiety disorder) 02/10/2017  . Hypertension 02/10/2017  . Lung nodule 04/2017   Stable 4 mm nodule in the periphery of the lateral segment , right, noted on CT Chest  . Palpitations 02/10/2017   a. 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   . Pneumonia 01/2018  . Raynaud's syndrome   . Splenomegaly 04/2017   noted on CT Chest  . SVT (supraventricular tachycardia) ([place])   . Systolic murmur   . Tendinopathy of rotator cuff    Right  . Tobacco use    Past Surgical History:  Procedure Laterality Date  . SACRO-ILIAC PINNING  2016   fusion, in [place]  . TONSILLECTOMY     age 13  . TOTAL HIP ARTHROPLASTY Right 12/05/2018   Procedure: TOTAL HIP ARTHROPLASTY ANTERIOR APPROACH;  Surgeon: Frederik Pear, MD;  [facility];  Service: Orthopedics;  Laterality: Right;   Social  History   Socioeconomic History  . Marital status: Single    Spouse name: Not on file  . Number of children: Not on file  . Years of education: Not on file  . Highest education level: Not on file  Occupational History  . Occupation: Financial planner: [employer]  Tobacco Use  . Smoking status: Current Every Day Smoker    Packs/day: 0.50    Years: 22.00    Pack years: 11.00    Types: Cigarettes, E-cigarettes  . Smokeless tobacco: Never Used  Vaping Use  . Vaping Use: Former  Substance and Sexual Activity  . Alcohol use: Yes    Alcohol/week: 2.0 standard drinks    Types: 1 Glasses of wine, 1 Cans of beer per week    Comment: 3/week  . Drug use: No  . Sexual activity: Not Currently    Birth control/protection: None  Other Topics Concern  . Not on file  Social History Narrative  . Not on file   Social Determinants of Health   Financial Resource Strain:   . Difficulty of Paying Living Expenses:   Food Insecurity:   . Worried About Charity fundraiser in the Last Year:   . Arboriculturist in the Last Year:   Transportation Needs:   . Film/video editor (Medical):   Marland Kitchen  Lack of Transportation (Non-Medical):   Physical Activity:   . Days of Exercise per Week:   . Minutes of Exercise per Session:   Stress:   . Feeling of Stress :   Social Connections:   . Frequency of Communication with Friends and Family:   . Frequency of Social Gatherings with Friends and Family:   . Attends Religious Services:   . Active Member of Clubs or Organizations:   . Attends Archivist Meetings:   Marland Kitchen Marital Status:    Family History  Problem Relation Age of Onset  . Depression Mother   . Hypertension Mother   . Cancer Mother        colon  . Diabetes Father   . Arrhythmia Sister        palpitations  . Cancer Maternal Grandfather        COLON  . Diabetes Paternal Grandfather    Allergies  Allergen Reactions  . Fish Allergy Nausea And Vomiting    ALL SEAFOOD  .  Shellfish Allergy Nausea And Vomiting    ALL SEAFOOD  . Sulfa Antibiotics Other (See Comments)    Thrush reaction Sores in mouth   Prior to Admission medications   Medication Sig Start Date End Date Taking? Authorizing Provider  alendronate (FOSAMAX) 70 MG tablet TAKE 1 TABLET BY MOUTH ONCE EVERY WEEK Patient taking differently: Take 70 mg by mouth once a week.  12/03/19  Yes Emeterio Reeve, DO  cyclobenzaprine (FLEXERIL) 10 MG tablet Take 5-10 mg by mouth 3 (three) times daily as needed for muscle spasms.   Yes [provider]  diclofenac Sodium (VOLTAREN) 1 % GEL Apply 2 g topically 2 (two) times daily as needed (joint pain).   Yes [provider]  metoprolol succinate (TOPROL-XL) 50 MG 24 hr tablet TAKE 1 TABLET BY MOUTH ONCE A DAY WITH A MEAL OR IMMEDIATELY AFTER. Patient taking differently: Take 50 mg by mouth daily.  12/25/19  Yes Emeterio Reeve, DO  Multiple Vitamins-Minerals (MULTIVITAMIN GUMMIES ADULT PO) Take 2 tablets by mouth daily.   Yes [provider]  Omeprazole 20 MG TBEC Take 20 mg by mouth daily as needed (heartburn).    Yes [provider]  sertraline (ZOLOFT) 50 MG tablet Take 1 tablet (50 mg total) by mouth daily. 07/08/19  Yes Emeterio Reeve, DO  Vitamin D, Ergocalciferol, (DRISDOL) 1.25 MG (50000 UT) CAPS capsule TAKE 1 CAPSULE (50,000 UNITS TOTAL) BY MOUTH EVERY 7 (SEVEN) DAYS. FOR 12 WEEKS 12/03/19  Yes Emeterio Reeve, DO  amoxicillin-clavulanate (AUGMENTIN) 875-125 MG tablet Take 1 tablet by mouth 2 (two) times daily. Patient not taking: Reported on 06/19/2020 04/06/20   Early, Coralee Pesa, NP  ciprofloxacin-dexamethasone (CIPRODEX) OTIC suspension Place 4 drops into the left ear 2 (two) times daily. Use for 7 days. Patient not taking: Reported on 06/19/2020 04/06/20   Orma Render, NP    ROS: All other systems have been reviewed and were otherwise negative with the exception of those mentioned in the HPI and as  above.  Physical Exam: General: Alert, no acute distress Cardiovascular: No pedal edema Respiratory: No cyanosis, no use of accessory musculature GI: No organomegaly, abdomen is soft and non-tender Skin: No lesions in the area of chief complaint Neurologic: Sensation intact distally Psychiatric: Patient is competent for consent with normal mood and affect Lymphatic: No axillary or cervical lymphadenopathy  MUSCULOSKELETAL:  Right shoulder: an active forward elevation to 170. Positive AC tenderness to palpation. Impingement on O'Brien's. There  is 4/5 supraspinatus strength.   Imaging: MRI demonstrates full thickness supraspinatus tear and bicipital pathology with a Type II acromion and AC arthrosis.   Assessment: RIGHT SHOULDER ROTATOR CUFF TEAR  Plan: Plan for Procedure(s): SHOULDER ARTHROSCOPY WITH OPEN ROTATOR CUFF REPAIR AND DISTAL CLAVICLE ACROMINECTOMY  We discussed the risks, benefits and alternatives for a subacromial decompression, distal clavicle excision, biceps tenodesis, rotator cuff repair and debridement.    The risks benefits and alternatives were discussed with the patient including but not limited to the risks of nonoperative treatment, versus surgical intervention including infection, bleeding, nerve injury,  blood clots, cardiopulmonary complications, morbidity, mortality, among others, and they were willing to proceed.   The patient acknowledged the explanation, agreed to proceed with the plan and consent was signed.   Operative Plan: Right shoulder scope with SAD, DCE, BT, RCR Discharge Medications: Standard DVT Prophylaxis: Aspirin (per patient request) Physical Therapy: Outpatient PT Special Discharge needs: Babb, PA-C  07/14/2020 12:37 PM

## 2020-07-15 ENCOUNTER — Other Ambulatory Visit: Payer: Self-pay

## 2020-07-15 ENCOUNTER — Ambulatory Visit (HOSPITAL_COMMUNITY)
Admission: RE | Admit: 2020-07-15 | Discharge: 2020-07-15 | Disposition: A | Discharge status: Home / Self Care | Payer: No Typology Code available for payment source | Attending: Orthopaedic Surgery | Admitting: Orthopaedic Surgery

## 2020-07-15 ENCOUNTER — Ambulatory Visit (HOSPITAL_COMMUNITY): Payer: No Typology Code available for payment source | Admitting: Physician Assistant

## 2020-07-15 ENCOUNTER — Ambulatory Visit (HOSPITAL_COMMUNITY): Payer: No Typology Code available for payment source | Admitting: Anesthesiology

## 2020-07-15 ENCOUNTER — Encounter (HOSPITAL_COMMUNITY): Payer: Self-pay | Admitting: Orthopaedic Surgery

## 2020-07-15 ENCOUNTER — Encounter (HOSPITAL_COMMUNITY)
Admission: RE | Disposition: A | Discharge status: Home / Self Care | Payer: Self-pay | Source: Home / Self Care | Attending: Orthopaedic Surgery

## 2020-07-15 DIAGNOSIS — Z91013 Allergy to seafood: Secondary | ICD-10-CM | POA: Insufficient documentation

## 2020-07-15 DIAGNOSIS — Z79899 Other long term (current) drug therapy: Secondary | ICD-10-CM | POA: Diagnosis not present

## 2020-07-15 DIAGNOSIS — Z96641 Presence of right artificial hip joint: Secondary | ICD-10-CM | POA: Diagnosis not present

## 2020-07-15 DIAGNOSIS — I10 Essential (primary) hypertension: Secondary | ICD-10-CM | POA: Diagnosis not present

## 2020-07-15 DIAGNOSIS — Z8249 Family history of ischemic heart disease and other diseases of the circulatory system: Secondary | ICD-10-CM | POA: Insufficient documentation

## 2020-07-15 DIAGNOSIS — I7300 Raynaud's syndrome without gangrene: Secondary | ICD-10-CM | POA: Diagnosis not present

## 2020-07-15 DIAGNOSIS — Z7983 Long term (current) use of bisphosphonates: Secondary | ICD-10-CM | POA: Insufficient documentation

## 2020-07-15 DIAGNOSIS — K219 Gastro-esophageal reflux disease without esophagitis: Secondary | ICD-10-CM | POA: Diagnosis not present

## 2020-07-15 DIAGNOSIS — F329 Major depressive disorder, single episode, unspecified: Secondary | ICD-10-CM | POA: Insufficient documentation

## 2020-07-15 DIAGNOSIS — F419 Anxiety disorder, unspecified: Secondary | ICD-10-CM | POA: Insufficient documentation

## 2020-07-15 DIAGNOSIS — K760 Fatty (change of) liver, not elsewhere classified: Secondary | ICD-10-CM | POA: Diagnosis not present

## 2020-07-15 DIAGNOSIS — M797 Fibromyalgia: Secondary | ICD-10-CM | POA: Insufficient documentation

## 2020-07-15 DIAGNOSIS — M75101 Unspecified rotator cuff tear or rupture of right shoulder, not specified as traumatic: Secondary | ICD-10-CM | POA: Insufficient documentation

## 2020-07-15 DIAGNOSIS — F17210 Nicotine dependence, cigarettes, uncomplicated: Secondary | ICD-10-CM | POA: Diagnosis not present

## 2020-07-15 DIAGNOSIS — Z882 Allergy status to sulfonamides status: Secondary | ICD-10-CM | POA: Diagnosis not present

## 2020-07-15 HISTORY — PX: SHOULDER ARTHROSCOPY WITH OPEN ROTATOR CUFF REPAIR AND DISTAL CLAVICLE ACROMINECTOMY: SHX5683

## 2020-07-15 LAB — PREGNANCY, URINE: Preg Test, Ur: NEGATIVE

## 2020-07-15 SURGERY — SHOULDER ARTHROSCOPY WITH OPEN ROTATOR CUFF REPAIR AND DISTAL CLAVICLE ACROMINECTOMY
Anesthesia: Regional | Site: Shoulder | Laterality: Right

## 2020-07-15 MED ORDER — ROCURONIUM BROMIDE 10 MG/ML (PF) SYRINGE
PREFILLED_SYRINGE | INTRAVENOUS | Status: DC | PRN
  Administered 2020-07-15: 60 mg via INTRAVENOUS

## 2020-07-15 MED ORDER — CEFAZOLIN SODIUM-DEXTROSE 2-4 GM/100ML-% IV SOLN
2.0000 g | INTRAVENOUS | Status: AC
  Administered 2020-07-15: 2 g via INTRAVENOUS
  Filled 2020-07-15: qty 100

## 2020-07-15 MED ORDER — PROPOFOL 10 MG/ML IV BOLUS
INTRAVENOUS | Status: DC | PRN
  Administered 2020-07-15: 150 mg via INTRAVENOUS

## 2020-07-15 MED ORDER — BUPIVACAINE LIPOSOME 1.3 % IJ SUSP
INTRAMUSCULAR | Status: DC | PRN
  Administered 2020-07-15: 10 mL via PERINEURAL

## 2020-07-15 MED ORDER — DIPHENHYDRAMINE HCL 50 MG/ML IJ SOLN
INTRAMUSCULAR | Status: DC | PRN
Start: 2020-07-15 — End: 2020-07-15
  Administered 2020-07-15: 12.5 mg via INTRAVENOUS

## 2020-07-15 MED ORDER — DEXAMETHASONE SODIUM PHOSPHATE 10 MG/ML IJ SOLN
INTRAMUSCULAR | Status: DC | PRN
  Administered 2020-07-15: 8 mg via INTRAVENOUS

## 2020-07-15 MED ORDER — BUPIVACAINE HCL (PF) 0.5 % IJ SOLN
INTRAMUSCULAR | Status: DC | PRN
  Administered 2020-07-15: 15 mL via PERINEURAL

## 2020-07-15 MED ORDER — EPINEPHRINE PF 1 MG/ML IJ SOLN
INTRAMUSCULAR | Status: DC | PRN
  Administered 2020-07-15: 3 mg

## 2020-07-15 MED ORDER — FENTANYL CITRATE (PF) 250 MCG/5ML IJ SOLN
INTRAMUSCULAR | Status: AC
  Filled 2020-07-15: qty 5

## 2020-07-15 MED ORDER — ONDANSETRON HCL 4 MG/2ML IJ SOLN
INTRAMUSCULAR | Status: DC | PRN
  Administered 2020-07-15: 4 mg via INTRAVENOUS

## 2020-07-15 MED ORDER — OXYCODONE HCL 5 MG PO TABS: ORAL_TABLET | ORAL | 0 refills | Status: AC

## 2020-07-15 MED ORDER — MIDAZOLAM HCL 5 MG/5ML IJ SOLN
INTRAMUSCULAR | Status: DC | PRN
  Administered 2020-07-15: 2 mg via INTRAVENOUS

## 2020-07-15 MED ORDER — ROCURONIUM BROMIDE 10 MG/ML (PF) SYRINGE
PREFILLED_SYRINGE | INTRAVENOUS | Status: AC
  Filled 2020-07-15: qty 10

## 2020-07-15 MED ORDER — FENTANYL CITRATE (PF) 100 MCG/2ML IJ SOLN
INTRAMUSCULAR | Status: DC | PRN
  Administered 2020-07-15 (×2): 100 ug via INTRAVENOUS
  Administered 2020-07-15: 50 ug via INTRAVENOUS

## 2020-07-15 MED ORDER — LIDOCAINE 2% (20 MG/ML) 5 ML SYRINGE
INTRAMUSCULAR | Status: AC
  Filled 2020-07-15: qty 5

## 2020-07-15 MED ORDER — EPINEPHRINE PF 1 MG/ML IJ SOLN
INTRAMUSCULAR | Status: AC
  Filled 2020-07-15: qty 3

## 2020-07-15 MED ORDER — PROPOFOL 10 MG/ML IV BOLUS
INTRAVENOUS | Status: AC
  Filled 2020-07-15: qty 20

## 2020-07-15 MED ORDER — ORAL CARE MOUTH RINSE: 15.0000 mL | Freq: Once | OROMUCOSAL | Status: AC

## 2020-07-15 MED ORDER — PHENYLEPHRINE HCL-NACL 10-0.9 MG/250ML-% IV SOLN
INTRAVENOUS | Status: DC | PRN
Start: 2020-07-15 — End: 2020-07-15
  Administered 2020-07-15: 40 ug/min via INTRAVENOUS

## 2020-07-15 MED ORDER — MELOXICAM 7.5 MG PO TABS
7.5000 mg | ORAL_TABLET | Freq: Every day | ORAL | 0 refills | Status: AC
Start: 2020-07-15 — End: 2020-08-14

## 2020-07-15 MED ORDER — LIDOCAINE 2% (20 MG/ML) 5 ML SYRINGE
INTRAMUSCULAR | Status: DC | PRN
  Administered 2020-07-15: 40 mg via INTRAVENOUS

## 2020-07-15 MED ORDER — ONDANSETRON HCL 4 MG/2ML IJ SOLN
INTRAMUSCULAR | Status: AC
  Filled 2020-07-15: qty 2

## 2020-07-15 MED ORDER — ASPIRIN 81 MG PO CHEW
81.0000 mg | CHEWABLE_TABLET | Freq: Two times a day (BID) | ORAL | 0 refills | Status: AC
Start: 2020-07-15 — End: 2020-08-14

## 2020-07-15 MED ORDER — DIPHENHYDRAMINE HCL 50 MG/ML IJ SOLN
INTRAMUSCULAR | Status: AC
  Filled 2020-07-15: qty 1

## 2020-07-15 MED ORDER — ACETAMINOPHEN 500 MG PO TABS
1000.0000 mg | ORAL_TABLET | Freq: Three times a day (TID) | ORAL | 0 refills | Status: AC
Start: 2020-07-15 — End: 2020-07-29

## 2020-07-15 MED ORDER — MIDAZOLAM HCL 2 MG/2ML IJ SOLN
INTRAMUSCULAR | Status: AC
  Filled 2020-07-15: qty 2

## 2020-07-15 MED ORDER — 0.9 % SODIUM CHLORIDE (POUR BTL) OPTIME
TOPICAL | Status: DC | PRN
  Administered 2020-07-15: 1000 mL

## 2020-07-15 MED ORDER — SODIUM CHLORIDE 0.9 % IR SOLN
Status: DC | PRN
  Administered 2020-07-15: 9000 mL

## 2020-07-15 MED ORDER — LACTATED RINGERS IV SOLN: INTRAVENOUS | Status: DC

## 2020-07-15 MED ORDER — ONDANSETRON HCL 4 MG PO TABS: 4.0000 mg | ORAL_TABLET | Freq: Three times a day (TID) | ORAL | 1 refills | Status: AC | PRN

## 2020-07-15 MED ORDER — ACETAMINOPHEN 500 MG PO TABS
1000.0000 mg | ORAL_TABLET | Freq: Once | ORAL | Status: AC
  Administered 2020-07-15: 1000 mg via ORAL
  Filled 2020-07-15: qty 2

## 2020-07-15 MED ORDER — SUGAMMADEX SODIUM 200 MG/2ML IV SOLN
INTRAVENOUS | Status: DC | PRN
  Administered 2020-07-15: 200 mg via INTRAVENOUS

## 2020-07-15 MED ORDER — CHLORHEXIDINE GLUCONATE 0.12 % MT SOLN
15.0000 mL | Freq: Once | OROMUCOSAL | Status: AC
  Administered 2020-07-15: 15 mL via OROMUCOSAL

## 2020-07-15 MED FILL — ACETAMINOPHEN EXTRA STRENGT: 500 | 17 days supply | Qty: 100 | Fill #0

## 2020-07-15 MED FILL — ASPIRIN CHILD 81 MG TAB CHE: 81 | 36 days supply | Qty: 72 | Fill #0

## 2020-07-15 MED FILL — oxyCODONE HCL 5 MG TABS: 5 | 5 days supply | Qty: 30 | Fill #0

## 2020-07-15 MED FILL — ONDANSETRON HCL 4 MG TABLET: 4 | 3 days supply | Qty: 10 | Fill #0

## 2020-07-15 MED FILL — MELOXICAM 7.5 MG TABLET: 7.5 | 30 days supply | Qty: 30 | Fill #0

## 2020-07-15 SURGICAL SUPPLY — 75 items
ANCHOR BIO SWLOCK 4.75 W/TIG (Anchor) ×2 IMPLANT
ANCHOR SUT 1.8 FBRTK KNTLS 2SU (Anchor) ×4 IMPLANT
ANCHOR SUT BIO SW 4.75X19.1 (Anchor) ×4 IMPLANT
BLADE EXCALIBUR 4.0X13 (MISCELLANEOUS) ×2 IMPLANT
BLADE SURG 15 STRL LF DISP TIS (BLADE) IMPLANT
BLADE SURG 15 STRL SS (BLADE)
BURR OVAL 8 FLU 4.0X13 (MISCELLANEOUS) IMPLANT
BURR OVAL 8 FLU 5.0X13 (MISCELLANEOUS) IMPLANT
CANNULA PASSPORT BUTTON 8X4 (CANNULA) ×2 IMPLANT
CANNULA TWIST IN 8.25X7CM (CANNULA) IMPLANT
CHLORAPREP W/TINT 26 (MISCELLANEOUS) ×2 IMPLANT
CLSR STERI-STRIP ANTIMIC 1/2X4 (GAUZE/BANDAGES/DRESSINGS) ×2 IMPLANT
COVER WAND RF STERILE (DRAPES) IMPLANT
DECANTER SPIKE VIAL GLASS SM (MISCELLANEOUS) IMPLANT
DISSECTOR  3.8MM X 13CM (MISCELLANEOUS) ×1
DISSECTOR 3.8MM X 13CM (MISCELLANEOUS) ×1 IMPLANT
DRAPE IMP U-DRAPE 54X76 (DRAPES) ×2 IMPLANT
DRAPE OEC MINIVIEW 54X84 (DRAPES) IMPLANT
DRAPE ORTHO SPLIT 77X108 STRL (DRAPES) ×2
DRAPE STERI 35X30 U-POUCH (DRAPES) ×2 IMPLANT
DRAPE SURG ORHT 6 SPLT 77X108 (DRAPES) ×2 IMPLANT
DRAPE U-SHAPE 47X51 STRL (DRAPES) ×2 IMPLANT
DRSG PAD ABDOMINAL 8X10 ST (GAUZE/BANDAGES/DRESSINGS) ×4 IMPLANT
ELECT MENISCUS 165MM 90D (ELECTRODE) ×2 IMPLANT
ELECT REM PT RETURN 15FT ADLT (MISCELLANEOUS) ×2 IMPLANT
FILTER STRAW (MISCELLANEOUS) ×2 IMPLANT
GAUZE SPONGE 4X4 12PLY STRL (GAUZE/BANDAGES/DRESSINGS) ×2 IMPLANT
GLOVE BIO SURGEON STRL SZ 6.5 (GLOVE) ×4 IMPLANT
GLOVE BIOGEL PI IND STRL 6.5 (GLOVE) ×1 IMPLANT
GLOVE BIOGEL PI IND STRL 8 (GLOVE) ×1 IMPLANT
GLOVE BIOGEL PI INDICATOR 6.5 (GLOVE) ×1
GLOVE BIOGEL PI INDICATOR 8 (GLOVE) ×1
GLOVE ECLIPSE 8.0 STRL XLNG CF (GLOVE) ×2 IMPLANT
GOWN STRL REUS W/ TWL LRG LVL3 (GOWN DISPOSABLE) ×2 IMPLANT
GOWN STRL REUS W/TWL LRG LVL3 (GOWN DISPOSABLE) ×2
IV NS IRRIG 3000ML ARTHROMATIC (IV SOLUTION) ×8 IMPLANT
KIT BASIN OR (CUSTOM PROCEDURE TRAY) ×2 IMPLANT
KIT STABILIZATION SHOULDER (MISCELLANEOUS) ×2 IMPLANT
KIT STR SPEAR 1.8 FBRTK DISP (KITS) ×2 IMPLANT
MANIFOLD NEPTUNE II (INSTRUMENTS) ×2 IMPLANT
NDL SAFETY ECLIPSE 18X1.5 (NEEDLE) ×1 IMPLANT
NDL SUT 6 .5 CRC .975X.05 MAYO (NEEDLE) IMPLANT
NEEDLE HYPO 18GX1.5 SHARP (NEEDLE) ×1
NEEDLE MAYO TAPER (NEEDLE)
NEEDLE SCORPION MULTI FIRE (NEEDLE) ×2 IMPLANT
PACK ARTHROSCOPY WL (CUSTOM PROCEDURE TRAY) ×2 IMPLANT
PASSER SUT SWANSON 36MM LOOP (INSTRUMENTS) IMPLANT
PORT APPOLLO RF 90DEGREE MULTI (SURGICAL WAND) ×2 IMPLANT
RESTRAINT HEAD UNIVERSAL NS (MISCELLANEOUS) ×2 IMPLANT
RETRIEVER SUT HEWSON (MISCELLANEOUS) IMPLANT
SLING ARM FOAM STRAP LRG (SOFTGOODS) IMPLANT
SLING ARM FOAM STRAP MED (SOFTGOODS) IMPLANT
SLING ARM FOAM STRAP SML (SOFTGOODS) IMPLANT
SLING ARM FOAM STRAP XLG (SOFTGOODS) IMPLANT
SLING ARM IMMOBILIZER MED (SOFTGOODS) IMPLANT
SPONGE LAP 4X18 RFD (DISPOSABLE) IMPLANT
SUCTION FRAZIER HANDLE 12FR (TUBING)
SUCTION TUBE FRAZIER 12FR DISP (TUBING) IMPLANT
SUT ETHIBOND 2 OS 4 DA (SUTURE) IMPLANT
SUT ETHILON 3 0 PS 1 (SUTURE) IMPLANT
SUT FIBERWIRE #2 38 T-5 BLUE (SUTURE)
SUT MNCRL AB 4-0 PS2 18 (SUTURE) ×2 IMPLANT
SUT PDS AB 1 CT1 27 (SUTURE) ×2 IMPLANT
SUT TIGER TAPE 7 IN WHITE (SUTURE) IMPLANT
SUT VIC AB 0 CT1 27 (SUTURE)
SUT VIC AB 0 CT1 27XBRD ANBCTR (SUTURE) IMPLANT
SUT VIC AB 3-0 FS2 27 (SUTURE) IMPLANT
SUTURE FIBERWR #2 38 T-5 BLUE (SUTURE) IMPLANT
SWIVELOCK BIO CLOSED 4.75 (Orthopedic Implant) IMPLANT
SYR 3ML LL SCALE MARK (SYRINGE) ×2 IMPLANT
TAPE FIBER 2MM 7IN #2 BLUE (SUTURE) IMPLANT
TOWEL OR 17X26 10 PK STRL BLUE (TOWEL DISPOSABLE) ×2 IMPLANT
TUBING ARTHROSCOPY IRRIG 16FT (MISCELLANEOUS) ×2 IMPLANT
WATER STERILE IRR 1000ML POUR (IV SOLUTION) ×2 IMPLANT
YANKAUER SUCT BULB TIP NO VENT (SUCTIONS) IMPLANT

## 2020-07-15 NOTE — Interval H&P Note (Signed)
History and Physical Interval Note:  07/15/2020 7:24 AM  Deborah Goodman  has presented today for surgery, with the diagnosis of [place].  The various methods of treatment have been discussed with the patient and family. After consideration of risks, benefits and other options for treatment, the patient has consented to  Procedure(s): SHOULDER ARTHROSCOPY WITH OPEN ROTATOR CUFF REPAIR AND DISTAL CLAVICLE ACROMINECTOMY (Right) as a surgical intervention.  The patient's history has been reviewed, patient examined, no change in status, stable for surgery.  I have reviewed the patient's chart and labs.  Questions were answered to the patient's satisfaction.     [name]

## 2020-07-15 NOTE — Anesthesia Procedure Notes (Signed)
Procedure Name: Intubation Date/Time: 07/15/2020 7:43 AM Performed by: Montel Clock, CRNA Pre-anesthesia Checklist: Patient identified, Emergency Drugs available, Suction available, Patient being monitored and Timeout performed Patient Re-evaluated:Patient Re-evaluated prior to induction Oxygen Delivery Method: Circle system utilized Preoxygenation: Pre-oxygenation with 100% oxygen Induction Type: IV induction Ventilation: Mask ventilation without difficulty Laryngoscope Size: 3 and Glidescope Grade View: Grade I Tube type: Oral Tube size: 7.0 mm Number of attempts: 1 Airway Equipment and Method: Video-laryngoscopy and Rigid stylet Placement Confirmation: ETT inserted through vocal cords under direct vision,  positive ETCO2 and breath sounds checked- equal and bilateral Secured at: 21 cm Tube secured with: Tape Dental Injury: Teeth and Oropharynx as per pre-operative assessment  Comments: Easy elective glidescope intubation. Patient/chart states prior difficulty in [place] and recommended glidescope.

## 2020-07-15 NOTE — Anesthesia Procedure Notes (Signed)
Anesthesia Regional Block: Interscalene brachial plexus block   Pre-Anesthetic Checklist: ,, timeout performed, Correct Patient, Correct Site, Correct Laterality, Correct Procedure, Correct Position, site marked, Risks and benefits discussed,  Surgical consent,  Pre-op evaluation,  At surgeon's request and post-op pain management  Laterality: Right  Prep: chloraprep       Needles:  Injection technique: Single-shot  Needle Type: Echogenic Stimulator Needle     Needle Length: 9cm  Needle Gauge: 21     Additional Needles:   Procedures:,,,, ultrasound used (permanent image in chart),,,,  Narrative:  Start time: 07/15/2020 6:45 AM End time: 07/15/2020 6:55 AM Injection made incrementally with aspirations every 5 mL.  Performed by: Personally  Anesthesiologist: Murvin Natal, MD  Additional Notes: Functioning IV was confirmed and monitors were applied.  A timeout was performed. Sterile prep, hand hygiene and sterile gloves were used. A 57mm 21ga Arrow echogenic stimulator needle was used. Negative aspiration and negative test dose prior to incremental administration of local anesthetic. The patient tolerated the procedure well.  Ultrasound guidance: relevent anatomy identified, needle position confirmed, local anesthetic spread visualized around nerve(s), vascular puncture avoided.  Image printed for medical record.

## 2020-07-15 NOTE — Op Note (Signed)
Orthopaedic Surgery Operative Note (CSN: [id number])  Deborah Goodman  1976-11-14 Date of Surgery: 07/15/2020   Diagnoses:  RIGHT SHOULDER ROTATOR CUFF TEAR  Procedure: Arthroscopic extensive debridement Arthroscopic subacromial decompression Arthroscopic rotator cuff repair Arthroscopic biceps tenodesis Arthroscopic distal clavicle excision   Operative Finding Exam under anesthesia: Full motion no limitation Articular space: No loose bodies, capsule intact, significant bicipital inflammation and injection, labral tearing Chondral surfaces:Intact, no sign of chondral degeneration on the glenoid or humeral head Biceps: Significant bicipital inflammation and injection though there was no obvious SLAP tear, anterior labral fraying Subscapularis: Intact Superior Cuff: Intra-articular space was intact Bursal side: 90% near full-thickness cuff tear in the anterior supraspinatus was noted.  1 x 2 repair performed  Successful completion of the planned procedure.  Patient had a 90% near full-thickness partial anterior supraspinatus tear.  This was taken down and done it is a full-thickness repair.  Good repair and will start therapy early.   Post-operative plan: The patient will be non-weightbearing in a sling with therapy to start after first visit.  The patient will be discharged home.  DVT prophylaxis with aspirin at patient request that she had some history of concern of DVT.   Pain control with PRN pain medication preferring oral medicines.  Follow up plan will be scheduled in approximately 7 days for incision check and XR.  Post-Op Diagnosis: Same Surgeons:Primary: [name], MD Assistants:Caroline McBane PA-C Location: [facility] ROOM 06 Anesthesia: General with Exparel interscalene block Antibiotics: Ancef 2 g Tourniquet time: None Estimated Blood Loss: Minimal Complications: None Specimens: None Implants: Implant Name Type Inv. Item Serial No. Manufacturer Lot No. LRB No. Used  Action  ANCHOR SUT 1.8 FIBERTAK 2 SUT - [id number] Anchor ANCHOR SUT 1.8 FIBERTAK 2 SUT  [employer] [id number] Right 1 Implanted  ANCHOR SUT 1.8 FIBERTAK 2 SUT - [id number] Anchor ANCHOR SUT 1.8 FIBERTAK 2 SUT  [employer] [id number] Right 1 Implanted  ANCHOR SUT BIO SW 4.75X19.1 - [id number] Anchor ANCHOR SUT BIO SW 4.75X19.1  [employer] [id number] Right 1 Implanted  ANCHOR SUT BIO SW 4.75X19.1 - [id number] Anchor ANCHOR SUT BIO SW 4.75X19.1  [employer] [id number] Right 1 Implanted  [facility] BIO SWLOCK 4.75 W/TIG - [id number] Anchor ANCHOR BIO SWLOCK 4.75 W/TIG  Deborah Goodman [id number] Right 1 Implanted    Indications for Surgery:   Deborah Goodman is a 44 y.o. female with continued shoulder pain refractory to nonoperative measures for extended period of time.   The risks and benefits were explained at length including but not limited to continued pain, cuff failure, biceps tenodesis failure, stiffness, need for further surgery and infection.   Procedure:   Patient was correctly identified in the preoperative holding area and operative site marked.  Patient brought to OR and positioned beachchair on an Hewitt table ensuring that all bony prominences were padded and the head was in an appropriate location.  Anesthesia was induced and the operative shoulder was prepped and draped in the usual sterile fashion.  Timeout was called preincision.  A standard posterior viewing portal was made after localizing the portal with a spinal needle.  An anterior accessory portal was also made.  After clearing the articular space the camera was positioned in the subacromial space.  Findings above.    Extensive debridement was performed of the anterior interval tissue, labral fraying and the bursa.  Subacromial decompression: We made a lateral portal with spinal needle guidance. We then proceeded to debride bursal tissue extensively with a  shaver and arthrocare device. At that point we continued to identify the borders of the  acromion and identify the spur. We then carefully preserved the deltoid fascia and used a burr to convert the acromion to a Type 1 flat acromion without issue.  Biceps tenodesis: We marked the tendon and then performed a tenotomy and debridement of the stump in the articular space. We then identified the biceps tendon in its groove suprapec with the arthroscope in the lateral portal taking care to move from lateral to medial to avoid injury to the subscapularis. At that point we unroofed the tendon itself and mobilized it. An accessory anterior portal was made in line with the tendon and we grasped it from the anterior superior portal and worked from the accessory anterior portal. Two Fibertak 1.57mm knotless anchors were placed in the groove and the tendon was secured in a luggage loop style fashion with a pass of the limb of suture through the tendon using a scorpion device to avoid pull-through.  Repair was completed with good tension on the tendon.  Residual stump of the tendon was removed after being resected with a RF ablator.  Distal Clavicle resection:  The scope was placed in the subacromial space from the posterior portal.  A hemostat was placed through the anterior portal and we spread at the [facility] joint.  A burr was then inserted and 10 mm of distal clavicle was resected taking care to avoid damage to the capsule around the joint and avoiding overhanging bone posteriorly.    Arthroscopic Rotator Cuff Repair: Tuberosity was prepared with a burr to a bleeding bed.  Following completion of the above we placed 1 4.7 Swivelock anchor loaded with a tape at inserted at the medial articular margin and an scorpion suture passing device, shuttled  sutures medially in a horizontal mattress suture configuration.  We then tied using arthroscopic knot tying techniques  each suture to its partner reducing the tendon at the prepared insertion site.  The fiber tape was not tied. With a medial row suture limbs then  incorporated, 2 anteriorly and  2 posteriorly, into each of two 4.75 PEEK SwiveLock anchors, each placed 8 to 10 mm below the tip of the tuberosity and spanning anterior-posterior width of the tear with care to avoid over tensioning.   The incisions were closed with absorbable monocryl and steri strips.  A sterile dressing was placed along with a sling. The patient was awoken from general anesthesia and taken to the PACU in stable condition without complication.   Noemi Chapel, PA-C, present and scrubbed throughout the case, critical for completion in a timely fashion, and for retraction, instrumentation, closure.

## 2020-07-15 NOTE — Transfer of Care (Signed)
Immediate Anesthesia Transfer of Care Note  Patient: Deborah Goodman  Procedure(s) Performed: SHOULDER ARTHROSCOPY WITH ROTATOR CUFF REPAIR AND DISTAL CLAVICLE ACROMINECTOMY AND BICEPS TENDOESIS (Right Shoulder)  Patient Location: PACU  Anesthesia Type:GA combined with regional for post-op pain  Level of Consciousness: drowsy and patient cooperative  Airway & Oxygen Therapy: Patient Spontanous Breathing and Patient connected to face mask oxygen  Post-op Assessment: Report given to RN and Post -op Vital signs reviewed and stable  Post vital signs: Reviewed and stable  Last Vitals:  Vitals Value Taken Time  BP 149/74 07/15/20 0906  Temp 36.7 C 07/15/20 0906  Pulse 76 07/15/20 0909  Resp 13 07/15/20 0909  SpO2 100 % 07/15/20 0909  Vitals shown include unvalidated device data.  Last Pain:  Vitals:   07/15/20 0601  TempSrc: Oral  PainSc:       Patients Stated Pain Goal: 3 (01/30/16 3567)  Complications: No complications documented.

## 2020-07-15 NOTE — Anesthesia Postprocedure Evaluation (Signed)
Anesthesia Post Note  Patient: LIANNI KANAAN  Procedure(s) Performed: SHOULDER ARTHROSCOPY WITH ROTATOR CUFF REPAIR AND DISTAL CLAVICLE ACROMINECTOMY AND BICEPS TENDOESIS (Right Shoulder)     Patient location during evaluation: PACU Anesthesia Type: Regional and General Level of consciousness: awake and alert Pain management: pain level controlled Vital Signs Assessment: post-procedure vital signs reviewed and stable Respiratory status: spontaneous breathing, nonlabored ventilation, respiratory function stable and patient connected to nasal cannula oxygen Cardiovascular status: blood pressure returned to baseline and stable Postop Assessment: no apparent nausea or vomiting Anesthetic complications: no   No complications documented.  Last Vitals:  Vitals:   07/15/20 0940 07/15/20 1011  BP: (!) (P) 135/89 (!) 140/98  Pulse: 70 76  Resp: (P) 16   Temp: 36.8 C 36.7 C  SpO2: 94% 95%    Last Pain:  Vitals:   07/15/20 1011  TempSrc:   PainSc: 0-No pain                 Shuntae Herzig P Kylina Vultaggio

## 2020-07-16 ENCOUNTER — Encounter (HOSPITAL_COMMUNITY): Payer: Self-pay | Admitting: Orthopaedic Surgery

## 2020-07-24 ENCOUNTER — Encounter: Payer: Self-pay | Admitting: Osteopathic Medicine

## 2020-08-05 ENCOUNTER — Ambulatory Visit: Payer: No Typology Code available for payment source | Admitting: Rehabilitative and Restorative Service Providers"

## 2020-08-06 ENCOUNTER — Encounter: Payer: Self-pay | Admitting: Rehabilitative and Restorative Service Providers"

## 2020-08-06 ENCOUNTER — Other Ambulatory Visit: Payer: Self-pay

## 2020-08-06 ENCOUNTER — Ambulatory Visit (INDEPENDENT_AMBULATORY_CARE_PROVIDER_SITE_OTHER): Payer: No Typology Code available for payment source | Admitting: Rehabilitative and Restorative Service Providers"

## 2020-08-06 DIAGNOSIS — R531 Weakness: Secondary | ICD-10-CM

## 2020-08-06 DIAGNOSIS — R29898 Other symptoms and signs involving the musculoskeletal system: Secondary | ICD-10-CM | POA: Diagnosis not present

## 2020-08-06 DIAGNOSIS — M25511 Pain in right shoulder: Secondary | ICD-10-CM

## 2020-08-06 DIAGNOSIS — G8929 Other chronic pain: Secondary | ICD-10-CM

## 2020-08-06 DIAGNOSIS — R293 Abnormal posture: Secondary | ICD-10-CM

## 2020-08-06 DIAGNOSIS — M25611 Stiffness of right shoulder, not elsewhere classified: Secondary | ICD-10-CM

## 2020-08-06 NOTE — Therapy (Addendum)
[place], [place], [address] Phone: [phone]   Fax:  [phone]  Physical Therapy Evaluation  Patient Details  Name: Deborah Goodman MRN: [id number] Date of Birth: [birth year] Referring Provider (PT): Dr Ophelia Charter    Encounter Date: 08/06/2020   PT End of Session - 08/06/20 1152    Visit Number 1    Number of Visits 24    Date for PT Re-Evaluation 10/29/20    PT Start Time 1103    PT Stop Time 1159    PT Time Calculation (min) 56 min    Activity Tolerance Patient tolerated treatment well           Past Medical History:  Diagnosis Date  . Acid reflux   . Avascular necrosis ([place]) 08/2018   Right hip  . Complication of anesthesia    needs glide scope because trachea sit to the side not in the middle after SI surgery did not have a voice  . Depression   . Fatty liver 04/2017   noted on CT Chest  . Fibromyalgia   . GAD (generalized anxiety disorder) 02/10/2017  . Hypertension 02/10/2017  . Lung nodule 04/2017   Stable 4 mm nodule in the periphery of the lateral segment , right, noted on CT Chest  . Palpitations 02/10/2017   a. 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   . Pneumonia 01/2018  . Raynaud's syndrome   . Splenomegaly 04/2017   noted on CT Chest  . SVT (supraventricular tachycardia) ([place])   . Systolic murmur   . Tendinopathy of rotator cuff    Right  . Tobacco use     Past Surgical History:  Procedure Laterality Date  . SACRO-ILIAC PINNING  2016   fusion, in [place]  . SHOULDER ARTHROSCOPY WITH OPEN ROTATOR CUFF REPAIR AND DISTAL CLAVICLE ACROMINECTOMY Right 07/15/2020   Procedure: SHOULDER ARTHROSCOPY WITH ROTATOR CUFF REPAIR AND DISTAL CLAVICLE [place] AND BICEPS TENDOESIS;  Surgeon: [name], MD;  Location: [facility];  Service: Orthopedics;  Laterality: Right;  . TONSILLECTOMY     age 44  . TOTAL HIP ARTHROPLASTY Right 12/05/2018    Procedure: TOTAL HIP ARTHROPLASTY ANTERIOR APPROACH;  Surgeon: Frederik Pear, MD;  Location: [facility];  Service: Orthopedics;  Laterality: Right;    There were no vitals filed for this visit.    Subjective Assessment - 08/06/20 1117    Subjective Patient reports that she had injury to Rt shoulder ~ 3 years ago with intermittent pain in the Rt shoulder. MRI showed RC tear. Underwent elective arthroscopic surgery for Rt RCR; SAD; DCR; biceps tendonesis; extensive debridement 07/15/20. She wore the sling for ~ 2 weeks but is not wearing the sling most of the the time. She stares that she can't drive and clean her house at home.    Currently in Pain? Yes    Pain Score 5     Pain Location Shoulder    Pain Orientation Right    Pain Descriptors / Indicators Aching    Pain Type Chronic pain;Surgical pain    Pain Radiating Towards shoulder to neck; to elbow    Pain Onset More than a month ago    Pain Frequency Constant    Aggravating Factors  moving the Rt arm; sleeping on Rt side    Pain Relieving Factors ice; meds              [facility] PT Assessment -  08/06/20 0001      Assessment   Medical Diagnosis Rt shoulder RCR; biceps tendonesis; DCR; [place]; extensive debridement     Referring Provider (PT) Dr Ophelia Charter     Onset Date/Surgical Date 07/15/20    Hand Dominance Right    Next MD Visit 08/25/20    Prior Therapy yes for shoulder and hip       Precautions   Precautions Other (comment)    Precaution Comments post op precautions       Balance Screen   Has the patient fallen in the past 6 months No    Has the patient had a decrease in activity level because of a fear of falling?  No    Is the patient reluctant to leave their home because of a fear of falling?  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     Leisure dog care; household  chores      Observation/Other Assessments   Focus on Therapeutic Outcomes (FOTO)  47% limitation       Observation/Other Assessments-Edema    Edema --   mild to moderate Rt shd girdle      Sensation   Additional Comments intermittent numbness and tingling in Rt hand - mainly when sleeping       Posture/Postural Control   Posture Comments head forward shoulders rounded and elevated; head of the humerus anterior in orientation; scapulae abducted and rotated along the thoracic wall       AROM   Right/Left Shoulder --   Rt shoulder NT    Left Shoulder Extension 42 Degrees    Left Shoulder Flexion 153 Degrees    Left Shoulder ABduction 154 Degrees    Left Shoulder Internal Rotation --   Thumb to T7   Left Shoulder External Rotation 90 Degrees      Strength   Overall Strength Comments not tested post surgery       Palpation   Palpation comment muscular tightness through the Rt shoulder girdle into pecs; upper trap; leveator; cervical musculature; biceps; deltoid                       Objective measurements completed on examination: See above findings.               PT Education - 08/06/20 1145    Education Details HEP POC precautions - USING SLING - reviewed post op precautions    Person(s) Educated Patient    Methods Explanation;Demonstration;Tactile cues;Verbal cues;Handout    Comprehension Verbalized understanding;Returned demonstration;Verbal cues required;Tactile cues required            PT Short Term Goals - 08/06/20 1302      PT SHORT TERM GOAL #1   Title Independent in initial exercise program    Time 6    Period Weeks    Status New    Target Date 09/17/20      PT SHORT TERM GOAL #2   Title improve posture and alignment with patient demonstrating good posterior shoulder girdle contraction/control    Time 6    Period Weeks    Status New    Target Date 09/17/20      PT SHORT TERM GOAL #3   Title PROM to 140 deg flexion; 40  deg ER at  neurtal; 60-80 deg abd without rotation per protocol    Time 6    Period Weeks    Status New    Target Date 09/17/20             PT Long Term Goals - 08/06/20 1258      PT LONG TERM GOAL #1   Title Improve posture and alignment with patient to demonstrate improved upright posture with posterior shoulder girdle engaged    Time 12    Period Weeks    Status New    Target Date 10/29/20      PT LONG TERM GOAL #2   Title Increase AROM Rt shoulder to functional level with no pain    Time 12    Period Weeks    Status New    Target Date 10/29/20      PT LONG TERM GOAL #3   Title 4+/5 to 5/5 shoulder strength    Baseline -    Time 12    Period Weeks    Status New    Target Date 10/29/20      PT LONG TERM GOAL #4   Title Independent in HEP    Time 12    Period Weeks    Status New    Target Date 10/29/20      PT LONG TERM GOAL #5   Title Improve FOTO to 28% limitation 02/07/2019    Time 12    Period Weeks    Status New    Target Date 10/29/20                  Plan - 08/06/20 1154    Clinical Impression Statement Patient presents s/p Rt RCR; biceps tendonesis; DCR; SAD; extensive debridement 07/15/20 for history of ~ 3 years of Rt shoulder pain. Patient has not been wearing her sling and has been doing activities like putting her hair up and sweeping/mopping using the Rt UE. Reviewed post op precautions and protocol and advised patient to return to full time use of sling Rt UE. She presents with c/o pain in Rt shoulder; intermittent numbness and tingling Rt UE; edema Rt shoudler girdle; Decreased active movement/ROM/strength Rt UE. Patient will beenfit from PT to address problems identified.    Stability/Clinical Decision Making Stable/Uncomplicated    Clinical Decision Making Low    Rehab Potential Good    PT Frequency 2x / week    PT Duration 12 weeks    PT Treatment/Interventions ADLs/Self Care Home Management;Aquatic Therapy;Cryotherapy;Electrical  Stimulation;Iontophoresis 4mg /ml Dexamethasone;Moist Heat;Ultrasound;Therapeutic activities;Therapeutic exercise;Neuromuscular re-education;Manual techniques;Dry needling;Taping    PT Next Visit Plan review HEP; progress with table slide and ER with cane - PROM    PT Home Exercise Plan [email or web address]    Consulted and Agree with Plan of Care Patient           Patient will benefit from skilled therapeutic intervention in order to improve the following deficits and impairments:     Visit Diagnosis: Chronic right shoulder pain - Plan: PT plan of care cert/re-cert  Other symptoms and signs involving the musculoskeletal system - Plan: PT plan of care cert/re-cert  Abnormal posture - Plan: PT plan of care cert/re-cert  Weakness generalized - Plan: PT plan of care cert/re-cert  Stiffness of joint, shoulder region, right - Plan: PT plan of care cert/re-cert     Problem List Patient Active Problem List   Diagnosis Date Noted  . Injury of right toe 03/09/2020  .  Fracture of one rib, right side, subsequent encounter for fracture with routine healing 01/06/2020  . Cellulitis of leg 12/09/2018  . History of total hip arthroplasty, right 12/05/2018  . Avascular necrosis ([place]) 09/10/2018  . Acute right hip pain 08/15/2018  . 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 Chronic right shoulder pain 07/23/2018  . Morbid obesity ([place]) 12/25/2017  . Family history of autoimmune disorder 12/18/2017  . Transaminitis 05/05/2017  . Lung nodule < 6cm on CT 05/05/2017  . Fever 04/27/2017  . PSVT (paroxysmal supraventricular tachycardia) ([place]) 03/13/2017  . Tobacco use 03/13/2017  . Palpitations 02/10/2017  . GAD (generalized anxiety disorder) 02/10/2017  . Hypertension 02/10/2017  . Abnormal thyroid blood test 02/10/2017    Tyffani Foglesong Nilda Simmer PT, MPH  08/06/2020, 1:14 PM  [facility] [place], [place], [address] Phone:  [phone]   Fax:  [phone]  Name: Deborah Goodman MRN: [id number] Date of Birth: [birth year]

## 2020-08-06 NOTE — Patient Instructions (Signed)
Access Code: [email or web address]: [email or web address]: 08/19/2021Prepared by: Mckyle Solanki HoltExercises  Circular Shoulder Pendulum with Table Support - 3-4 x daily - 7 x weekly - 1 sets - 20-30 reps  Flexion-Extension Shoulder Pendulum with Table Support - 3-4 x daily - 7 x weekly - 1 sets - 20-30 reps  Horizontal Shoulder Pendulum with Table Support - 3-4 x daily - 7 x weekly - 1 sets - 20-30 reps  Seated Scapular Retraction - 2 x daily - 7 x weekly - 1-2 sets - 10 reps - 10 sec hold  Seated Triceps Extension Single Arm - 2 x daily - 7 x weekly - 1 sets - 5-10 reps - 3-5 hold  Seated Cervical Retraction - 2 x daily - 7 x weekly - 1-2 sets - 5-10 reps - 10 sec hold  Seated Cervical Sidebending AROM - 2 x daily - 7 x weekly - 1 sets - 5 reps - 5-10 sec hold

## 2020-08-12 ENCOUNTER — Encounter: Payer: Self-pay | Admitting: Rehabilitative and Restorative Service Providers"

## 2020-08-12 ENCOUNTER — Other Ambulatory Visit: Payer: Self-pay

## 2020-08-12 ENCOUNTER — Ambulatory Visit (INDEPENDENT_AMBULATORY_CARE_PROVIDER_SITE_OTHER): Payer: No Typology Code available for payment source | Admitting: Rehabilitative and Restorative Service Providers"

## 2020-08-12 DIAGNOSIS — G8929 Other chronic pain: Secondary | ICD-10-CM

## 2020-08-12 DIAGNOSIS — M25511 Pain in right shoulder: Secondary | ICD-10-CM

## 2020-08-12 DIAGNOSIS — M25611 Stiffness of right shoulder, not elsewhere classified: Secondary | ICD-10-CM

## 2020-08-12 DIAGNOSIS — R293 Abnormal posture: Secondary | ICD-10-CM | POA: Diagnosis not present

## 2020-08-12 DIAGNOSIS — R531 Weakness: Secondary | ICD-10-CM

## 2020-08-12 DIAGNOSIS — R29898 Other symptoms and signs involving the musculoskeletal system: Secondary | ICD-10-CM

## 2020-08-12 NOTE — Therapy (Signed)
[place], [place], [address] Phone: [phone]   Fax:  [phone]  Physical Therapy Treatment  Patient Details  Name: Deborah Goodman MRN: [id number] Date of Birth: [birth year] Referring Provider (PT): Dr Ophelia Charter    Encounter Date: 08/12/2020   PT End of Session - 08/12/20 1150    Visit Number 2    Number of Visits 24    Date for PT Re-Evaluation 10/29/20    PT Start Time 1147    PT Stop Time 1237    PT Time Calculation (min) 50 min    Activity Tolerance Patient tolerated treatment well           Past Medical History:  Diagnosis Date  . Acid reflux   . Avascular necrosis ([place]) 08/2018   Right hip  . Complication of anesthesia    needs glide scope because trachea sit to the side not in the middle after SI surgery did not have a voice  . Depression   . Fatty liver 04/2017   noted on CT Chest  . Fibromyalgia   . GAD (generalized anxiety disorder) 02/10/2017  . Hypertension 02/10/2017  . Lung nodule 04/2017   Stable 4 mm nodule in the periphery of the lateral segment , right, noted on CT Chest  . Palpitations 02/10/2017   a. 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   . Pneumonia 01/2018  . Raynaud's syndrome   . Splenomegaly 04/2017   noted on CT Chest  . SVT (supraventricular tachycardia) ([place])   . Systolic murmur   . Tendinopathy of rotator cuff    Right  . Tobacco use     Past Surgical History:  Procedure Laterality Date  . SACRO-ILIAC PINNING  2016   fusion, in [place]  . SHOULDER ARTHROSCOPY WITH OPEN ROTATOR CUFF REPAIR AND DISTAL CLAVICLE ACROMINECTOMY Right 07/15/2020   Procedure: SHOULDER ARTHROSCOPY WITH ROTATOR CUFF REPAIR AND DISTAL CLAVICLE [place] AND BICEPS TENDOESIS;  Surgeon: [name], MD;  Location: [facility];  Service: Orthopedics;  Laterality: Right;  . TONSILLECTOMY     age 44  . TOTAL HIP ARTHROPLASTY Right 12/05/2018    Procedure: TOTAL HIP ARTHROPLASTY ANTERIOR APPROACH;  Surgeon: Frederik Pear, MD;  Location: [facility];  Service: Orthopedics;  Laterality: Right;    There were no vitals filed for this visit.   Subjective Assessment - 08/12/20 1151    Subjective Patient reports that her arm has been numb in the sling. She wears the sling some but does not wear it much of the time and does not wear the sling when driving.    Currently in Pain? Yes    Pain Score 3     Pain Location Shoulder    Pain Orientation Right    Pain Descriptors / Indicators Sore    Pain Type Surgical pain;Chronic pain                             [facility] Adult PT Treatment/Exercise - 08/12/20 0001      Shoulder Exercises: Standing   Other Standing Exercises scap squeeze with noodle 10 sec x 10 reps       Shoulder Exercises: ROM/Strengthening   Pendulum 30/30 CW/CCW       Vasopneumatic   Number Minutes Vasopneumatic  10 minutes    Vasopnuematic Location  Shoulder   Rt   Vasopneumatic Pressure Medium  Vasopneumatic Temperature  34 deg       Manual Therapy   Manual therapy comments pt supine     Soft tissue mobilization to pt tolerance working through the Rt shoulder girdle working through anterior/superior/posterior musculature; biceps    Myofascial Release anterior chest/shoulder     Passive ROM Rt shoulder flexion to ~ 100 deg; abductin to 70 deg; ER to neutral                   PT Education - 08/12/20 1207    Education Details HEP use of sling    Person(s) Educated Patient    Methods Explanation;Demonstration;Tactile cues;Verbal cues;Handout    Comprehension Verbalized understanding;Returned demonstration;Verbal cues required;Tactile cues required            PT Short Term Goals - 08/06/20 1302      PT SHORT TERM GOAL #1   Title Independent in initial exercise program    Time 6    Period Weeks    Status New    Target Date 09/17/20      PT SHORT TERM GOAL #2   Title improve posture  and alignment with patient demonstrating good posterior shoulder girdle contraction/control    Time 6    Period Weeks    Status New    Target Date 09/17/20      PT SHORT TERM GOAL #3   Title PROM to 140 deg flexion; 40 deg ER at neurtal; 60-80 deg abd without rotation per protocol    Time 6    Period Weeks    Status New    Target Date 09/17/20             PT Long Term Goals - 08/06/20 1258      PT LONG TERM GOAL #1   Title Improve posture and alignment with patient to demonstrate improved upright posture with posterior shoulder girdle engaged    Time 12    Period Weeks    Status New    Target Date 10/29/20      PT LONG TERM GOAL #2   Title Increase AROM Rt shoulder to functional level with no pain    Time 12    Period Weeks    Status New    Target Date 10/29/20      PT LONG TERM GOAL #3   Title 4+/5 to 5/5 shoulder strength    Baseline -    Time 12    Period Weeks    Status New    Target Date 10/29/20      PT LONG TERM GOAL #4   Title Independent in HEP    Time 12    Period Weeks    Status New    Target Date 10/29/20      PT LONG TERM GOAL #5   Title Improve FOTO to 28% limitation 02/07/2019    Time 12    Period Weeks    Status New    Target Date 10/29/20                 Plan - 08/12/20 1158    Clinical Impression Statement Patient is not compliant with sling. She is not wearing the sling when she is sleeping; driving; around her house. Reviewed her exercises; added ER and table slide; continued with manual work and PROM.    Rehab Potential Good    PT Frequency 2x / week    PT Duration 12 weeks    PT Treatment/Interventions ADLs/Self  Care Home Management;Aquatic Therapy;Cryotherapy;Electrical Stimulation;Iontophoresis 4mg /ml Dexamethasone;Moist Heat;Ultrasound;Therapeutic activities;Therapeutic exercise;Neuromuscular re-education;Manual techniques;Dry needling;Taping    PT Next Visit Plan review HEP; progress with table slide and ER with cane -  PROM    PT Home Exercise Plan [email or web address]    Consulted and Agree with Plan of Care Patient           Patient will benefit from skilled therapeutic intervention in order to improve the following deficits and impairments:     Visit Diagnosis: Chronic right shoulder pain  Other symptoms and signs involving the musculoskeletal system  Abnormal posture  Weakness generalized  Stiffness of joint, shoulder region, right     Problem List Patient Active Problem List   Diagnosis Date Noted  . Injury of right toe 03/09/2020  . Fracture of one rib, right side, subsequent encounter for fracture with routine healing 01/06/2020  . Cellulitis of leg 12/09/2018  . History of total hip arthroplasty, right 12/05/2018  . Avascular necrosis ([place]) 09/10/2018  . Acute right hip pain 08/15/2018  . 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 Chronic right shoulder pain 07/23/2018  . Morbid obesity ([place]) 12/25/2017  . Family history of autoimmune disorder 12/18/2017  . Transaminitis 05/05/2017  . Lung nodule < 6cm on CT 05/05/2017  . Fever 04/27/2017  . PSVT (paroxysmal supraventricular tachycardia) ([place]) 03/13/2017  . Tobacco use 03/13/2017  . Palpitations 02/10/2017  . GAD (generalized anxiety disorder) 02/10/2017  . Hypertension 02/10/2017  . Abnormal thyroid blood test 02/10/2017    Joette Schmoker Nilda Simmer PT, MPH  08/12/2020, 12:32 PM  [facility] [place], [place], [address] Phone: [phone]   Fax:  [phone]  Name: STORI ROYSE MRN: [id number] Date of Birth: September 06, 1976

## 2020-08-12 NOTE — Patient Instructions (Signed)
Access Code: [email or web address]: [email or web address]: 08/25/2021Prepared by: Arney Mayabb HoltExercises  Circular Shoulder Pendulum with Table Support - 3-4 x daily - 7 x weekly - 1 sets - 20-30 reps  Flexion-Extension Shoulder Pendulum with Table Support - 3-4 x daily - 7 x weekly - 1 sets - 20-30 reps  Horizontal Shoulder Pendulum with Table Support - 3-4 x daily - 7 x weekly - 1 sets - 20-30 reps  Seated Scapular Retraction - 2 x daily - 7 x weekly - 1-2 sets - 10 reps - 10 sec hold  Seated Triceps Extension Single Arm - 2 x daily - 7 x weekly - 1 sets - 5-10 reps - 3-5 hold  Seated Cervical Retraction - 2 x daily - 7 x weekly - 1-2 sets - 5-10 reps - 10 sec hold  Seated Cervical Sidebending AROM - 2 x daily - 7 x weekly - 1 sets - 5 reps - 5-10 sec hold  Seated Shoulder External Rotation AAROM with Cane and Hand in Neutral - 2 x daily - 7 x weekly - 1 sets - 5-10 reps - 5-10 sec hold  Seated Shoulder Flexion Towel Slide at Table Top Full Range of Motion - 2 x daily - 7 x weekly - 1 sets - 5-10 reps - 5-10 sec hold

## 2020-08-14 ENCOUNTER — Encounter: Payer: Self-pay | Admitting: Rehabilitative and Restorative Service Providers"

## 2020-08-14 ENCOUNTER — Other Ambulatory Visit: Payer: Self-pay

## 2020-08-14 ENCOUNTER — Ambulatory Visit (INDEPENDENT_AMBULATORY_CARE_PROVIDER_SITE_OTHER): Payer: No Typology Code available for payment source | Admitting: Rehabilitative and Restorative Service Providers"

## 2020-08-14 DIAGNOSIS — R29898 Other symptoms and signs involving the musculoskeletal system: Secondary | ICD-10-CM | POA: Diagnosis not present

## 2020-08-14 DIAGNOSIS — R531 Weakness: Secondary | ICD-10-CM | POA: Diagnosis not present

## 2020-08-14 DIAGNOSIS — M25611 Stiffness of right shoulder, not elsewhere classified: Secondary | ICD-10-CM

## 2020-08-14 DIAGNOSIS — M25511 Pain in right shoulder: Secondary | ICD-10-CM

## 2020-08-14 DIAGNOSIS — G8929 Other chronic pain: Secondary | ICD-10-CM

## 2020-08-14 DIAGNOSIS — R293 Abnormal posture: Secondary | ICD-10-CM

## 2020-08-14 NOTE — Therapy (Signed)
[place], [place], [address] Phone: [phone]   Fax:  [phone]  Physical Therapy Treatment  Patient Details  Name: Deborah Goodman MRN: [id number] Date of Birth: [birth year] Referring Provider (PT): Dr Ophelia Charter    Encounter Date: 08/14/2020   PT End of Session - 08/14/20 1148    Visit Number 3    Number of Visits 24    Date for PT Re-Evaluation 10/29/20    PT Start Time 1147    PT Stop Time 3254    PT Time Calculation (min) 48 min    Activity Tolerance Patient tolerated treatment well           Past Medical History:  Diagnosis Date  . Acid reflux   . Avascular necrosis ([place]) 08/2018   Right hip  . Complication of anesthesia    needs glide scope because trachea sit to the side not in the middle after SI surgery did not have a voice  . Depression   . Fatty liver 04/2017   noted on CT Chest  . Fibromyalgia   . GAD (generalized anxiety disorder) 02/10/2017  . Hypertension 02/10/2017  . Lung nodule 04/2017   Stable 4 mm nodule in the periphery of the lateral segment , right, noted on CT Chest  . Palpitations 02/10/2017   a. 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   . Pneumonia 01/2018  . Raynaud's syndrome   . Splenomegaly 04/2017   noted on CT Chest  . SVT (supraventricular tachycardia) ([place])   . Systolic murmur   . Tendinopathy of rotator cuff    Right  . Tobacco use     Past Surgical History:  Procedure Laterality Date  . SACRO-ILIAC PINNING  2016   fusion, in [place]  . SHOULDER ARTHROSCOPY WITH OPEN ROTATOR CUFF REPAIR AND DISTAL CLAVICLE ACROMINECTOMY Right 07/15/2020   Procedure: SHOULDER ARTHROSCOPY WITH ROTATOR CUFF REPAIR AND DISTAL CLAVICLE [place] AND BICEPS TENDOESIS;  Surgeon: [name], MD;  Location: [facility];  Service: Orthopedics;  Laterality: Right;  . TONSILLECTOMY     age 41  . TOTAL HIP ARTHROPLASTY Right 12/05/2018    Procedure: TOTAL HIP ARTHROPLASTY ANTERIOR APPROACH;  Surgeon: Frederik Pear, MD;  Location: [facility];  Service: Orthopedics;  Laterality: Right;    There were no vitals filed for this visit.   Subjective Assessment - 08/14/20 1149    Subjective Patient reports that she overdid it yesterday - working with the pool noodle at home.    Currently in Pain? Other (Comment)   soreness - no pain score   Pain Location Shoulder    Pain Orientation Right    Pain Descriptors / Indicators Sore    Pain Type Surgical pain;Chronic pain    Pain Onset More than a month ago    Pain Frequency Constant              [facility] PT Assessment - 08/14/20 0001      Assessment   Medical Diagnosis Rt shoulder RCR; biceps tendonesis; DCR; SAC; extensive debridement     Referring Provider (PT) Dr Ophelia Charter     Onset Date/Surgical Date 07/15/20    Hand Dominance Right    Next MD Visit 08/25/20    Prior Therapy yes for shoulder and hip       PROM   Right Shoulder Flexion 140 Degrees    Right Shoulder ABduction 80 Degrees   in scapular  plane    Right Shoulder External Rotation 40 Degrees   in scapular plane - arm at side      Palpation   Palpation comment muscular tightness through the Rt shoulder girdle into pecs; upper trap; leveator; cervical musculature; biceps; deltoid                          [facility] Adult PT Treatment/Exercise - 08/14/20 0001      Shoulder Exercises: Seated   Other Seated Exercises posterior shoulder rolls x ~ 10 reps bilat UE's resting on lap       Shoulder Exercises: Standing   Other Standing Exercises scap squeeze with noodle 10 sec x 10 reps       Shoulder Exercises: ROM/Strengthening   Pendulum 30/30 CW/CCW       Shoulder Exercises: Stretch   External Rotation Stretch 5 reps;10 seconds   elbow at side 90 deg flexion w/ cane    Table Stretch - Flexion --   10 sec hold x 8 reps      Vasopneumatic   Number Minutes Vasopneumatic  10 minutes    Vasopnuematic  Location  Shoulder   Rt   Vasopneumatic Pressure Medium    Vasopneumatic Temperature  34 deg       Manual Therapy   Manual therapy comments pt supine     Soft tissue mobilization to pt tolerance working through the Rt shoulder girdle working through anterior/superior/posterior musculature; biceps    Myofascial Release anterior chest/shoulder     Passive ROM Rt shoulder flexion to ~ 100 deg; abductin to 70 deg; ER to neutral                     PT Short Term Goals - 08/06/20 1302      PT SHORT TERM GOAL #1   Title Independent in initial exercise program    Time 6    Period Weeks    Status New    Target Date 09/17/20      PT SHORT TERM GOAL #2   Title improve posture and alignment with patient demonstrating good posterior shoulder girdle contraction/control    Time 6    Period Weeks    Status New    Target Date 09/17/20      PT SHORT TERM GOAL #3   Title PROM to 140 deg flexion; 40 deg ER at neurtal; 60-80 deg abd without rotation per protocol    Time 6    Period Weeks    Status New    Target Date 09/17/20             PT Long Term Goals - 08/06/20 1258      PT LONG TERM GOAL #1   Title Improve posture and alignment with patient to demonstrate improved upright posture with posterior shoulder girdle engaged    Time 12    Period Weeks    Status New    Target Date 10/29/20      PT LONG TERM GOAL #2   Title Increase AROM Rt shoulder to functional level with no pain    Time 12    Period Weeks    Status New    Target Date 10/29/20      PT LONG TERM GOAL #3   Title 4+/5 to 5/5 shoulder strength    Baseline -    Time 12    Period Weeks    Status New  Target Date 10/29/20      PT LONG TERM GOAL #4   Title Independent in HEP    Time 12    Period Weeks    Status New    Target Date 10/29/20      PT LONG TERM GOAL #5   Title Improve FOTO to 28% limitation 02/07/2019    Time 12    Period Weeks    Status New    Target Date 10/29/20                  Plan - 08/14/20 1226    Clinical Impression Statement Deborah Goodman reports soreness in the shoulder blade area from scap squeeze with noodle. No pain, just soreness. She continues to be noncompliant with wearing her sling and avoiding use of Rt UE. Persistent muscular tightness noted Rt upper quarter. Excellent PROM with range to protocol limits without tissue resistance or any discomfort. Continued reminders to keep Rt UE supported and avoid any use of Rt UE per MD protocol.    Rehab Potential Good    PT Frequency 2x / week    PT Duration 12 weeks    PT Treatment/Interventions ADLs/Self Care Home Management;Aquatic Therapy;Cryotherapy;Electrical Stimulation;Iontophoresis 4mg /ml Dexamethasone;Moist Heat;Ultrasound;Therapeutic activities;Therapeutic exercise;Neuromuscular re-education;Manual techniques;Dry needling;Taping    PT Next Visit Plan continue with HEP; exercise per protocol; manual work and PROM Rt shoulder as allowed by protocol; progress shoulder rehab per protocol. RTD 08/25/20    PT Home Exercise Plan [id number]    Consulted and Agree with Plan of Care Patient           Patient will benefit from skilled therapeutic intervention in order to improve the following deficits and impairments:     Visit Diagnosis: Chronic right shoulder pain  Other symptoms and signs involving the musculoskeletal system  Abnormal posture  Weakness generalized  Stiffness of joint, shoulder region, right     Problem List Patient Active Problem List   Diagnosis Date Noted  . Injury of right toe 03/09/2020  . Fracture of one rib, right side, subsequent encounter for fracture with routine healing 01/06/2020  . Cellulitis of leg 12/09/2018  . History of total hip arthroplasty, right 12/05/2018  . Avascular necrosis ([place]) 09/10/2018  . Acute right hip pain 08/15/2018  . 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 Chronic right shoulder pain 07/23/2018  . Morbid obesity ([place])  12/25/2017  . Family history of autoimmune disorder 12/18/2017  . Transaminitis 05/05/2017  . Lung nodule < 6cm on CT 05/05/2017  . Fever 04/27/2017  . PSVT (paroxysmal supraventricular tachycardia) ([place]) 03/13/2017  . Tobacco use 03/13/2017  . Palpitations 02/10/2017  . GAD (generalized anxiety disorder) 02/10/2017  . Hypertension 02/10/2017  . Abnormal thyroid blood test 02/10/2017    Deborah Goodman PT, MPH  08/14/2020, 12:40 PM  [facility] [place], [place], [address] Phone: [phone]   Fax:  [phone]  Name: Deborah Goodman MRN: [id number] Date of Birth: 07-08-1976

## 2020-08-18 ENCOUNTER — Encounter: Payer: Self-pay | Admitting: Rehabilitative and Restorative Service Providers"

## 2020-08-18 ENCOUNTER — Other Ambulatory Visit: Payer: Self-pay

## 2020-08-18 ENCOUNTER — Ambulatory Visit (INDEPENDENT_AMBULATORY_CARE_PROVIDER_SITE_OTHER): Payer: No Typology Code available for payment source | Admitting: Rehabilitative and Restorative Service Providers"

## 2020-08-18 DIAGNOSIS — R293 Abnormal posture: Secondary | ICD-10-CM

## 2020-08-18 DIAGNOSIS — R531 Weakness: Secondary | ICD-10-CM | POA: Diagnosis not present

## 2020-08-18 DIAGNOSIS — M25511 Pain in right shoulder: Secondary | ICD-10-CM | POA: Diagnosis not present

## 2020-08-18 DIAGNOSIS — R29898 Other symptoms and signs involving the musculoskeletal system: Secondary | ICD-10-CM | POA: Diagnosis not present

## 2020-08-18 DIAGNOSIS — G8929 Other chronic pain: Secondary | ICD-10-CM

## 2020-08-18 NOTE — Therapy (Signed)
[place], [place], [address] Phone: [phone]   Fax:  [phone]  Physical Therapy Treatment  Patient Details  Name: Deborah Goodman MRN: [id number] Date of Birth: November 20, 1976 Referring Provider (PT): Dr Ophelia Charter    Encounter Date: 08/18/2020   PT End of Session - 08/18/20 1103    Visit Number 4    Number of Visits 24    Date for PT Re-Evaluation 10/29/20    PT Start Time 1100    PT Stop Time 1147    PT Time Calculation (min) 47 min    Activity Tolerance Patient tolerated treatment well           Past Medical History:  Diagnosis Date   Acid reflux    Avascular necrosis ([place]) 08/2018   Right hip   Complication of anesthesia    needs glide scope because trachea sit to the side not in the middle after SI surgery did not have a voice   Depression    Fatty liver 04/2017   noted on CT Chest   Fibromyalgia    GAD (generalized anxiety disorder) 02/10/2017   Hypertension 02/10/2017   Lung nodule 04/2017   Stable 4 mm nodule in the periphery of the lateral segment , right, noted on CT Chest   Palpitations 02/10/2017   a. 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    Pneumonia 01/2018   Raynaud's syndrome    Splenomegaly 04/2017   noted on CT Chest   SVT (supraventricular tachycardia) ([facility])    Systolic murmur    Tendinopathy of rotator cuff    Right   Tobacco use     Past Surgical History:  Procedure Laterality Date   SACRO-ILIAC PINNING  2016   fusion, in [place] ARTHROSCOPY WITH OPEN ROTATOR CUFF REPAIR AND DISTAL CLAVICLE ACROMINECTOMY Right 07/15/2020   Procedure: SHOULDER ARTHROSCOPY WITH ROTATOR CUFF REPAIR AND DISTAL CLAVICLE [place] AND BICEPS TENDOESIS;  Surgeon: [name], MD;  [facility];  Service: Orthopedics;  Laterality: Right;   TONSILLECTOMY     age 44   TOTAL HIP ARTHROPLASTY Right 12/05/2018    Procedure: TOTAL HIP ARTHROPLASTY ANTERIOR APPROACH;  Surgeon: Frederik Pear, MD;  [facility];  Service: Orthopedics;  Laterality: Right;    There were no vitals filed for this visit.   Subjective Assessment - 08/18/20 1103    Subjective Saturday had some throbbing pain in the Rt shoulder lasting about an hour and then resolved with ice and rest. No further episodes of pain. Working on her exercises at home. Still using Rt UE to put her hair up on top of her head. She sees the surgeon Tuesday, 08/25/20.    Currently in Pain? Yes    Pain Score 4     Pain Location Shoulder    Pain Orientation Right    Pain Descriptors / Indicators Sore    Pain Type Surgical pain;Chronic pain                             [facility] Adult PT Treatment/Exercise - 08/18/20 0001      Shoulder Exercises: Seated   Other Seated Exercises posterior shoulder rolls x ~ 10 reps bilat UE's resting on lap       Shoulder Exercises: Standing   Other Standing Exercises scap squeeze with noodle 10 sec x 10 reps  Shoulder Exercises: ROM/Strengthening   Pendulum 30/30 CW/CCW       Shoulder Exercises: Stretch   External Rotation Stretch 5 reps;10 seconds   elbow at side 90 deg flexion w/ cane - to pt tolerance    Table Stretch - Flexion --   10 sec hold x 8 reps      Vasopneumatic   Number Minutes Vasopneumatic  10 minutes    Vasopnuematic Location  Shoulder   Rt   Vasopneumatic Pressure Medium    Vasopneumatic Temperature  34 deg       Manual Therapy   Manual therapy comments pt supine     Soft tissue mobilization to pt tolerance working through the Rt shoulder girdle working through anterior/superior/posterior musculature; biceps    Myofascial Release anterior chest/shoulder     Passive ROM Rt shoulder flexion to ~ 100 deg; abductin to 70 deg; ER to neutral                     PT Short Term Goals - 08/06/20 1302      PT SHORT TERM GOAL #1   Title Independent in initial  exercise program    Time 6    Period Weeks    Status New    Target Date 09/17/20      PT SHORT TERM GOAL #2   Title improve posture and alignment with patient demonstrating good posterior shoulder girdle contraction/control    Time 6    Period Weeks    Status New    Target Date 09/17/20      PT SHORT TERM GOAL #3   Title PROM to 140 deg flexion; 40 deg ER at neurtal; 60-80 deg abd without rotation per protocol    Time 6    Period Weeks    Status New    Target Date 09/17/20             PT Long Term Goals - 08/06/20 1258      PT LONG TERM GOAL #1   Title Improve posture and alignment with patient to demonstrate improved upright posture with posterior shoulder girdle engaged    Time 12    Period Weeks    Status New    Target Date 10/29/20      PT LONG TERM GOAL #2   Title Increase AROM Rt shoulder to functional level with no pain    Time 12    Period Weeks    Status New    Target Date 10/29/20      PT LONG TERM GOAL #3   Title 4+/5 to 5/5 shoulder strength    Baseline -    Time 12    Period Weeks    Status New    Target Date 10/29/20      PT LONG TERM GOAL #4   Title Independent in HEP    Time 12    Period Weeks    Status New    Target Date 10/29/20      PT LONG TERM GOAL #5   Title Improve FOTO to 28% limitation 02/07/2019    Time 12    Period Weeks    Status New    Target Date 10/29/20                 Plan - 08/18/20 1136    Clinical Impression Statement Continued soreness Rt shoulder. Patient is not wearing sling at all. Reports compliance with HEP but does lift Rt UE to  put her hair up. Persistent muscular tightness Rt shoulder upper quarter. Excellent PROM with pt in supine. PROM is within the range for protocol limitswithout tissue resistance or discomfort. Continued reminders to keep Rt UE supported and avoid use of Rt UE per MD protocol. Patient sees MD 08/25/20.    Rehab Potential Good    PT Frequency 2x / week    PT Duration 12 weeks     PT Treatment/Interventions ADLs/Self Care Home Management;Aquatic Therapy;Cryotherapy;Electrical Stimulation;Iontophoresis 4mg /ml Dexamethasone;Moist Heat;Ultrasound;Therapeutic activities;Therapeutic exercise;Neuromuscular re-education;Manual techniques;Dry needling;Taping    PT Next Visit Plan continue with HEP; exercise per protocol; manual work and PROM Rt shoulder as allowed by protocol; progress shoulder rehab per protocol. Note to MD at next visit. RTD 08/25/20    PT Home Exercise Plan [id number]    Consulted and Agree with Plan of Care Patient           Patient will benefit from skilled therapeutic intervention in order to improve the following deficits and impairments:     Visit Diagnosis: Chronic right shoulder pain  Other symptoms and signs involving the musculoskeletal system  Abnormal posture  Weakness generalized     Problem List Patient Active Problem List   Diagnosis Date Noted   Injury of right toe 03/09/2020   Fracture of one rib, right side, subsequent encounter for fracture with routine healing 01/06/2020   Cellulitis of leg 12/09/2018   History of total hip arthroplasty, right 12/05/2018   Avascular necrosis ([place]) 09/10/2018   Acute right hip pain 08/15/2018   Status post right sacroiliac joint fusion 08/15/2018   Chronic right shoulder pain 07/23/2018   Morbid obesity ([place]) 12/25/2017   Family history of autoimmune disorder 12/18/2017   Transaminitis 05/05/2017   Lung nodule < 6cm on CT 05/05/2017   Fever 04/27/2017   PSVT (paroxysmal supraventricular tachycardia) ([place]) 03/13/2017   Tobacco use 03/13/2017   Palpitations 02/10/2017   GAD (generalized anxiety disorder) 02/10/2017   Hypertension 02/10/2017   Abnormal thyroid blood test 02/10/2017    Isra Lindy Nilda Simmer PT, MPH  08/18/2020, 11:41 AM  [facility] [address] [place], [place], [address] Phone: [phone]   Fax:   [phone]  Name: Deborah Goodman MRN: [id number] Date of Birth: 08/13/1976

## 2020-08-21 ENCOUNTER — Ambulatory Visit (INDEPENDENT_AMBULATORY_CARE_PROVIDER_SITE_OTHER): Payer: No Typology Code available for payment source | Admitting: Physical Therapy

## 2020-08-21 ENCOUNTER — Other Ambulatory Visit: Payer: Self-pay

## 2020-08-21 DIAGNOSIS — R293 Abnormal posture: Secondary | ICD-10-CM | POA: Diagnosis not present

## 2020-08-21 DIAGNOSIS — M25511 Pain in right shoulder: Secondary | ICD-10-CM | POA: Diagnosis not present

## 2020-08-21 DIAGNOSIS — R29898 Other symptoms and signs involving the musculoskeletal system: Secondary | ICD-10-CM | POA: Diagnosis not present

## 2020-08-21 DIAGNOSIS — G8929 Other chronic pain: Secondary | ICD-10-CM

## 2020-08-21 NOTE — Therapy (Signed)
[place], [place], [address] Phone: [phone]   Fax:  [phone]  Physical Therapy Treatment  Patient Details  Name: Deborah Goodman MRN: [id number] Date of Birth: Sep 11, 1976 Referring Provider (PT): Dr Ophelia Charter    Encounter Date: 08/21/2020   PT End of Session - 08/21/20 1144    Visit Number 5    Number of Visits 24    Date for PT Re-Evaluation 10/29/20    PT Start Time 1102    PT Stop Time 1146    PT Time Calculation (min) 44 min    Activity Tolerance Patient tolerated treatment well    Behavior During Therapy [facility] for tasks assessed/performed           Past Medical History:  Diagnosis Date  . Acid reflux   . Avascular necrosis ([place]) 08/2018   Right hip  . Complication of anesthesia    needs glide scope because trachea sit to the side not in the middle after SI surgery did not have a voice  . Depression   . Fatty liver 04/2017   noted on CT Chest  . Fibromyalgia   . GAD (generalized anxiety disorder) 02/10/2017  . Hypertension 02/10/2017  . Lung nodule 04/2017   Stable 4 mm nodule in the periphery of the lateral segment , right, noted on CT Chest  . Palpitations 02/10/2017   a. 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   . Pneumonia 01/2018  . Raynaud's syndrome   . Splenomegaly 04/2017   noted on CT Chest  . SVT (supraventricular tachycardia) ([place])   . Systolic murmur   . Tendinopathy of rotator cuff    Right  . Tobacco use     Past Surgical History:  Procedure Laterality Date  . SACRO-ILIAC PINNING  2016   fusion, in [place]  . SHOULDER ARTHROSCOPY WITH OPEN ROTATOR CUFF REPAIR AND DISTAL CLAVICLE ACROMINECTOMY Right 07/15/2020   Procedure: SHOULDER ARTHROSCOPY WITH ROTATOR CUFF REPAIR AND DISTAL CLAVICLE [place] AND BICEPS TENDOESIS;  Surgeon: [name], MD;  [facility];  Service: Orthopedics;  Laterality: Right;  . TONSILLECTOMY      age 44  . TOTAL HIP ARTHROPLASTY Right 12/05/2018   Procedure: TOTAL HIP ARTHROPLASTY ANTERIOR APPROACH;  Surgeon: Frederik Pear, MD;  [facility];  Service: Orthopedics;  Laterality: Right;    There were no vitals filed for this visit.   Subjective Assessment - 08/21/20 1636    Subjective Pt reports she has some soreness in her Rt shoulder . She tries to remember not to use her Rt arm, but it's hard as she reports she lives at home alone and has a dog to care for. She is not wearing sling, but often wears a shirt that has a front pocket she can rest her Rt hand in.    Currently in Pain? Yes    Pain Score 3     Pain Location Shoulder    Pain Orientation Right    Pain Descriptors / Indicators Sore    Aggravating Factors  moving Rt arm in certain ways.    Pain Relieving Factors ice, medication              [facility] PT Assessment - 08/21/20 0001      Assessment   Medical Diagnosis Rt shoulder RCR; biceps tendonesis; DCR; SAC; extensive debridement     Referring Provider (PT) Dr Ophelia Charter     Onset  Date/Surgical Date 07/15/20    Hand Dominance Right    Next MD Visit 08/25/20    Prior Therapy yes for shoulder and hip       PROM   Right Shoulder Flexion 140 Degrees    Right Shoulder ABduction 80 Degrees   in scapular plane   Right Shoulder External Rotation 60 Degrees   supine, 40 deg scaption          [facility] Adult PT Treatment/Exercise - 08/21/20 0001      Shoulder Exercises: Seated   Other Seated Exercises posterior shoulder rolls x ~ 10 reps bilat UE's resting on lap       Shoulder Exercises: Standing   Other Standing Exercises scap squeeze with noodle 10 sec x 10 reps     Other Standing Exercises pendulum with RUE x 30 sec     Shoulder Exercises: Stretch   External Rotation Stretch 5 reps;10 seconds   elbow at side 90 deg flexion w/ cane - to pt tolerance    Table Stretch - Flexion --   10 sec hold x 8 reps      Vasopneumatic   Number Minutes Vasopneumatic  10  minutes    Vasopnuematic Location  Shoulder   Rt   Vasopneumatic Pressure Low    Vasopneumatic Temperature  34 deg       Manual Therapy   Manual Therapy Scapular mobilization    Soft tissue mobilization to pt tolerance working through the Rt shoulder girdle working through anterior/superior/posterior musculature; biceps    Myofascial Release anterior chest/shoulder     Scapular Mobilization Rt shoulder in all planes    Passive ROM Rt shoulder flexion, scaption, ER to tissue limits and no pain.                     PT Short Term Goals - 08/21/20 1640      PT SHORT TERM GOAL #1   Title Independent in initial exercise program    Time 6    Period Weeks    Status Achieved    Target Date 09/17/20      PT SHORT TERM GOAL #2   Title improve posture and alignment with patient demonstrating good posterior shoulder girdle contraction/control    Time 6    Period Weeks    Status On-going    Target Date 09/17/20      PT SHORT TERM GOAL #3   Title PROM to 140 deg flexion; 40 deg ER at neurtal; 60-80 deg abd without rotation per protocol    Time 6    Period Weeks    Status Achieved    Target Date 09/17/20             PT Long Term Goals - 08/06/20 1258      PT LONG TERM GOAL #1   Title Improve posture and alignment with patient to demonstrate improved upright posture with posterior shoulder girdle engaged    Time 12    Period Weeks    Status New    Target Date 10/29/20      PT LONG TERM GOAL #2   Title Increase AROM Rt shoulder to functional level with no pain    Time 12    Period Weeks    Status New    Target Date 10/29/20      PT LONG TERM GOAL #3   Title 4+/5 to 5/5 shoulder strength    Baseline -    Time 12  Period Weeks    Status New    Target Date 10/29/20      PT LONG TERM GOAL #4   Title Independent in HEP    Time 12    Period Weeks    Status New    Target Date 10/29/20      PT LONG TERM GOAL #5   Title Improve FOTO to 28% limitation  02/07/2019    Time 12    Period Weeks    Status New    Target Date 10/29/20                 Plan - 08/21/20 1144    Clinical Impression Statement Continued soreness Rt shoulder. Patient reports very limited use of sling. Reports compliance with HEP but does lift Rt UE to put her hair up, for reaching overhead shelves with grocery shopping and driving. Excellent PROM with pt in supine. PROM is within the range for protocol limits without tissue resistance or discomfort. Continued reminders to keep Rt UE supported and avoid use of Rt UE per MD protocol.    Rehab Potential Good    PT Frequency 2x / week    PT Duration 12 weeks    PT Treatment/Interventions ADLs/Self Care Home Management;Aquatic Therapy;Cryotherapy;Electrical Stimulation;Iontophoresis 4mg /ml Dexamethasone;Moist Heat;Ultrasound;Therapeutic activities;Therapeutic exercise;Neuromuscular re-education;Manual techniques;Dry needling;Taping    PT Next Visit Plan exercise per protocol; manual work and PROM Rt shoulder as allowed by protocol; progress shoulder rehab per protocol.    PT Home Exercise Plan [id number]    Consulted and Agree with Plan of Care Patient           Patient will benefit from skilled therapeutic intervention in order to improve the following deficits and impairments:     Visit Diagnosis: Chronic right shoulder pain  Other symptoms and signs involving the musculoskeletal system  Abnormal posture     Problem List Patient Active Problem List   Diagnosis Date Noted  . Injury of right toe 03/09/2020  . Fracture of one rib, right side, subsequent encounter for fracture with routine healing 01/06/2020  . Cellulitis of leg 12/09/2018  . History of total hip arthroplasty, right 12/05/2018  . Avascular necrosis ([place]) 09/10/2018  . Acute right hip pain 08/15/2018  . 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 Chronic right shoulder pain 07/23/2018  . Morbid obesity ([place]) 12/25/2017  .  Family history of autoimmune disorder 12/18/2017  . Transaminitis 05/05/2017  . Lung nodule < 6cm on CT 05/05/2017  . Fever 04/27/2017  . PSVT (paroxysmal supraventricular tachycardia) ([place]) 03/13/2017  . Tobacco use 03/13/2017  . Palpitations 02/10/2017  . GAD (generalized anxiety disorder) 02/10/2017  . Hypertension 02/10/2017  . Abnormal thyroid blood test 02/10/2017   [name], PTA 08/21/20 4:41 PM  [place], [place], [address] Phone: [phone]   Fax:  [phone]  Name: Deborah Goodman MRN: [id number] Date of Birth: 01-Feb-1976

## 2020-08-26 ENCOUNTER — Encounter: Payer: No Typology Code available for payment source | Admitting: Rehabilitative and Restorative Service Providers"

## 2020-08-28 ENCOUNTER — Other Ambulatory Visit: Payer: Self-pay

## 2020-08-28 ENCOUNTER — Encounter: Payer: Self-pay | Admitting: Physical Therapy

## 2020-08-28 ENCOUNTER — Ambulatory Visit (INDEPENDENT_AMBULATORY_CARE_PROVIDER_SITE_OTHER): Payer: No Typology Code available for payment source | Admitting: Physical Therapy

## 2020-08-28 DIAGNOSIS — M25511 Pain in right shoulder: Secondary | ICD-10-CM | POA: Diagnosis not present

## 2020-08-28 DIAGNOSIS — R293 Abnormal posture: Secondary | ICD-10-CM | POA: Diagnosis not present

## 2020-08-28 DIAGNOSIS — R29898 Other symptoms and signs involving the musculoskeletal system: Secondary | ICD-10-CM | POA: Diagnosis not present

## 2020-08-28 DIAGNOSIS — R531 Weakness: Secondary | ICD-10-CM

## 2020-08-28 DIAGNOSIS — G8929 Other chronic pain: Secondary | ICD-10-CM

## 2020-08-28 NOTE — Therapy (Signed)
[place], [place], [address] Phone: [phone]   Fax:  [phone]  Physical Therapy Treatment  Patient Details  Name: Deborah Goodman MRN: [id number] Date of Birth: Sep 05, 1976 Referring Provider (PT): Dr Ophelia Charter    Encounter Date: 08/28/2020   PT End of Session - 08/28/20 1028    Visit Number 6    Number of Visits 24    Date for PT Re-Evaluation 10/29/20    PT Start Time 1018    PT Stop Time 1100    PT Time Calculation (min) 42 min    Activity Tolerance Patient tolerated treatment well    Behavior During Therapy [facility] for tasks assessed/performed           Past Medical History:  Diagnosis Date  . Acid reflux   . Avascular necrosis ([place]) 08/2018   Right hip  . Complication of anesthesia    needs glide scope because trachea sit to the side not in the middle after SI surgery did not have a voice  . Depression   . Fatty liver 04/2017   noted on CT Chest  . Fibromyalgia   . GAD (generalized anxiety disorder) 02/10/2017  . Hypertension 02/10/2017  . Lung nodule 04/2017   Stable 4 mm nodule in the periphery of the lateral segment , right, noted on CT Chest  . Palpitations 02/10/2017   a. 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   . Pneumonia 01/2018  . Raynaud's syndrome   . Splenomegaly 04/2017   noted on CT Chest  . SVT (supraventricular tachycardia) ([place])   . Systolic murmur   . Tendinopathy of rotator cuff    Right  . Tobacco use     Past Surgical History:  Procedure Laterality Date  . SACRO-ILIAC PINNING  2016   fusion, in [place]  . SHOULDER ARTHROSCOPY WITH OPEN ROTATOR CUFF REPAIR AND DISTAL CLAVICLE ACROMINECTOMY Right 07/15/2020   Procedure: SHOULDER ARTHROSCOPY WITH ROTATOR CUFF REPAIR AND DISTAL CLAVICLE [place] AND BICEPS TENDOESIS;  Surgeon: [name], MD;  Location: [facility];  Service: Orthopedics;  Laterality: Right;  . TONSILLECTOMY      age 44  . TOTAL HIP ARTHROPLASTY Right 12/05/2018   Procedure: TOTAL HIP ARTHROPLASTY ANTERIOR APPROACH;  Surgeon: Frederik Pear, MD;  Location: [facility];  Service: Orthopedics;  Laterality: Right;    There were no vitals filed for this visit.   Subjective Assessment - 08/28/20 1024    Subjective Pt reports she had to clean a mess on/around her oven; arm was sore afterwards.  She had follow up with surgeon; he was pleased with progress and told her she can move to next phase.    Currently in Pain? Yes    Pain Score 2     Pain Location Shoulder    Pain Orientation Right    Pain Descriptors / Indicators Sore    Aggravating Factors  using arm too much    Pain Relieving Factors ice, medication              [facility] PT Assessment - 08/28/20 0001      Assessment   Medical Diagnosis Rt shoulder RCR; biceps tendonesis; DCR; SAC; extensive debridement     Referring Provider (PT) Dr Ophelia Charter     Onset Date/Surgical Date 07/15/20    Hand Dominance Right    Next MD Visit 10/06/20    Prior Therapy yes for shoulder and hip  PROM   Right Shoulder External Rotation 45 Degrees            [facility] Adult PT Treatment/Exercise - 08/28/20 0001      Shoulder Exercises: Supine   Horizontal ABduction AAROM;Right;5 reps   cane, to tolerance    Horizontal ABduction Limitations to scaption, not to neutral yet.     External Rotation AAROM;Right;10 reps   cane   Flexion AAROM;Both;5 reps   cane   ABduction Right;AAROM;10 reps   cane     Shoulder Exercises: Seated   Other Seated Exercises scap squueze x 5 sec x 10 reps;  scap depression with Rt hand on coregeous ball x 5 sec x 10 reps       Shoulder Exercises: Standing   Internal Rotation AAROM;10 reps   2 variations, 1 set of each; with cane   Extension AAROM;Both;10 reps   cane; in front of mirror for posture     Shoulder Exercises: Pulleys   Flexion 3 minutes    Scaption 3 minutes      Shoulder Exercises: Stretch   Table Stretch -  External Rotation 5 reps   5 sec hold     Vasopneumatic   Number Minutes Vasopneumatic  10 minutes    Vasopnuematic Location  Shoulder   Rt   Vasopneumatic Pressure Low    Vasopneumatic Temperature  34 deg                   PT Education - 08/28/20 1100    Education Details HEP updated.    Person(s) Educated Patient    Methods Explanation;Handout;Verbal cues;Demonstration    Comprehension Verbalized understanding            PT Short Term Goals - 08/21/20 1640      PT SHORT TERM GOAL #1   Title Independent in initial exercise program    Time 6    Period Weeks    Status Achieved    Target Date 09/17/20      PT SHORT TERM GOAL #2   Title improve posture and alignment with patient demonstrating good posterior shoulder girdle contraction/control    Time 6    Period Weeks    Status On-going    Target Date 09/17/20      PT SHORT TERM GOAL #3   Title PROM to 140 deg flexion; 40 deg ER at neurtal; 60-80 deg abd without rotation per protocol    Time 6    Period Weeks    Status Achieved    Target Date 09/17/20             PT Long Term Goals - 08/06/20 1258      PT LONG TERM GOAL #1   Title Improve posture and alignment with patient to demonstrate improved upright posture with posterior shoulder girdle engaged    Time 12    Period Weeks    Status New    Target Date 10/29/20      PT LONG TERM GOAL #2   Title Increase AROM Rt shoulder to functional level with no pain    Time 12    Period Weeks    Status New    Target Date 10/29/20      PT LONG TERM GOAL #3   Title 4+/5 to 5/5 shoulder strength    Baseline -    Time 12    Period Weeks    Status New    Target Date 10/29/20  PT LONG TERM GOAL #4   Title Independent in HEP    Time 12    Period Weeks    Status New    Target Date 10/29/20      PT LONG TERM GOAL #5   Title Improve FOTO to 28% limitation 02/07/2019    Time 12    Period Weeks    Status New    Target Date 10/29/20                  Plan - 08/28/20 1101    Clinical Impression Statement Pt arrived with elevated pain level from functional use of RUE for cleaning.  Pt now 6 wks s/p Rt RTC repair; today we moved into phase 2.  Pt tolerated all exercises well, with no increase in soreness from what she arrived with. AAROM of Rt shoulder ER tighter than last assessment.    Rehab Potential Good    PT Frequency 2x / week    PT Duration 12 weeks    PT Treatment/Interventions ADLs/Self Care Home Management;Aquatic Therapy;Cryotherapy;Electrical Stimulation;Iontophoresis 4mg /ml Dexamethasone;Moist Heat;Ultrasound;Therapeutic activities;Therapeutic exercise;Neuromuscular re-education;Manual techniques;Dry needling;Taping    PT Next Visit Plan exercise per protocol; manual work and PROM Rt shoulder as allowed by protocol; progress shoulder rehab per protocol.    PT Home Exercise Plan [email or web address]    Consulted and Agree with Plan of Care Patient           Patient will benefit from skilled therapeutic intervention in order to improve the following deficits and impairments:  Decreased range of motion, Increased muscle spasms, Impaired UE functional use, Decreased activity tolerance, Pain, Improper body mechanics, Decreased mobility, Decreased strength, Increased edema, Postural dysfunction, Impaired flexibility  Visit Diagnosis: Chronic right shoulder pain  Other symptoms and signs involving the musculoskeletal system  Abnormal posture  Weakness generalized     Problem List Patient Active Problem List   Diagnosis Date Noted  . Injury of right toe 03/09/2020  . Fracture of one rib, right side, subsequent encounter for fracture with routine healing 01/06/2020  . Cellulitis of leg 12/09/2018  . History of total hip arthroplasty, right 12/05/2018  . Avascular necrosis ([place]) 09/10/2018  . Acute right hip pain 08/15/2018  . 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 Chronic right shoulder pain  07/23/2018  . Morbid obesity ([place]) 12/25/2017  . Family history of autoimmune disorder 12/18/2017  . Transaminitis 05/05/2017  . Lung nodule < 6cm on CT 05/05/2017  . Fever 04/27/2017  . PSVT (paroxysmal supraventricular tachycardia) ([place]) 03/13/2017  . Tobacco use 03/13/2017  . Palpitations 02/10/2017  . GAD (generalized anxiety disorder) 02/10/2017  . Hypertension 02/10/2017  . Abnormal thyroid blood test 02/10/2017   [name], PTA 08/28/20 4:19 PM  [place], [place], [address] Phone: [phone]   Fax:  [phone]  Name: Deborah Goodman MRN: [id number] Date of Birth: 11/14/1976

## 2020-08-28 NOTE — Patient Instructions (Signed)
Access Code: [email or web address]: [email or web address]: 09/10/2021Prepared by: [place]  Circular Shoulder Pendulum with Table Support - 3-4 x daily - 7 x weekly - 1 sets - 20-30 reps  Seated Scapular Retraction - 2 x daily - 7 x weekly - 1-2 sets - 10 reps - 10 sec hold  Seated Cervical Sidebending AROM - 2 x daily - 7 x weekly - 1 sets - 5 reps - 5-10 sec hold  Seated Shoulder Flexion Towel Slide at Table Top Full Range of Motion - 2 x daily - 7 x weekly - 1 sets - 5 reps - 5-10 sec hold  Supine Shoulder Abduction AAROM with [place] - 2 x daily - 7 x weekly - 1 sets - 10 reps - 5 hold  Supine Shoulder External Rotation in 45 Degrees Abduction AAROM with Dowel - 2 x daily - 7 x weekly - 1 sets - 10 reps - 5 hold  Standing Shoulder Extension with Dowel - 2 x daily - 7 x weekly - 1 sets - 10 reps - 5 hold  Standing Shoulder Internal Rotation AAROM with [place]el - 2 x daily - 7 x weekly - 1 sets - 10 reps - 5 hold  Seated Shoulder Setting - 2 x daily - 7 x weekly - 1 sets - 10 reps - 5 hold

## 2020-08-30 ENCOUNTER — Encounter: Payer: Self-pay | Admitting: Osteopathic Medicine

## 2020-08-31 ENCOUNTER — Other Ambulatory Visit: Payer: Self-pay

## 2020-08-31 DIAGNOSIS — F411 Generalized anxiety disorder: Secondary | ICD-10-CM

## 2020-08-31 MED ORDER — VITAMIN D (ERGOCALCIFEROL) 1.25 MG (50000 UNIT) PO CAPS: 50000.0000 [IU] | ORAL_CAPSULE | ORAL | 0 refills | Status: DC

## 2020-08-31 MED ORDER — SERTRALINE HCL 50 MG PO TABS: 50.0000 mg | ORAL_TABLET | Freq: Every day | ORAL | 3 refills | Status: DC

## 2020-08-31 MED FILL — SERTRALINE HCL 50 MG TABLET: 50 | 90 days supply | Qty: 90 | Fill #0

## 2020-08-31 MED FILL — VIT D2 1.25 MG (50,000 UNIT: 1.25 MG | 84 days supply | Qty: 12 | Fill #0

## 2020-09-02 ENCOUNTER — Ambulatory Visit (INDEPENDENT_AMBULATORY_CARE_PROVIDER_SITE_OTHER): Payer: No Typology Code available for payment source | Admitting: Physical Therapy

## 2020-09-02 ENCOUNTER — Other Ambulatory Visit: Payer: Self-pay

## 2020-09-02 ENCOUNTER — Encounter: Payer: Self-pay | Admitting: Physical Therapy

## 2020-09-02 DIAGNOSIS — R29898 Other symptoms and signs involving the musculoskeletal system: Secondary | ICD-10-CM | POA: Diagnosis not present

## 2020-09-02 DIAGNOSIS — R293 Abnormal posture: Secondary | ICD-10-CM

## 2020-09-02 DIAGNOSIS — M25511 Pain in right shoulder: Secondary | ICD-10-CM | POA: Diagnosis not present

## 2020-09-02 DIAGNOSIS — G8929 Other chronic pain: Secondary | ICD-10-CM

## 2020-09-02 NOTE — Therapy (Signed)
[place], [place], [address] Phone: [phone]   Fax:  [phone]  Physical Therapy Treatment  Patient Details  Name: Deborah Goodman MRN: [id number] Date of Birth: [birth year] Referring Provider (PT): Dr Ophelia Charter    Encounter Date: 09/02/2020   PT End of Session - 09/02/20 1156    Visit Number 7    Number of Visits 24    Date for PT Re-Evaluation 10/29/20    PT Start Time 1149    PT Stop Time 1230    PT Time Calculation (min) 41 min    Activity Tolerance Patient tolerated treatment well    Behavior During Therapy [facility] for tasks assessed/performed           Past Medical History:  Diagnosis Date  . Acid reflux   . Avascular necrosis ([place]) 08/2018   Right hip  . Complication of anesthesia    needs glide scope because trachea sit to the side not in the middle after SI surgery did not have a voice  . Depression   . Fatty liver 04/2017   noted on CT Chest  . Fibromyalgia   . GAD (generalized anxiety disorder) 02/10/2017  . Hypertension 02/10/2017  . Lung nodule 04/2017   Stable 4 mm nodule in the periphery of the lateral segment , right, noted on CT Chest  . Palpitations 02/10/2017   a. 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   . Pneumonia 01/2018  . Raynaud's syndrome   . Splenomegaly 04/2017   noted on CT Chest  . SVT (supraventricular tachycardia) ([place])   . Systolic murmur   . Tendinopathy of rotator cuff    Right  . Tobacco use     Past Surgical History:  Procedure Laterality Date  . SACRO-ILIAC PINNING  2016   fusion, in [place]  . SHOULDER ARTHROSCOPY WITH OPEN ROTATOR CUFF REPAIR AND DISTAL CLAVICLE ACROMINECTOMY Right 07/15/2020   Procedure: SHOULDER ARTHROSCOPY WITH ROTATOR CUFF REPAIR AND DISTAL CLAVICLE [place] AND BICEPS TENDOESIS;  Surgeon: [name], MD;  Location: [facility];  Service: Orthopedics;  Laterality: Right;  . TONSILLECTOMY      age 44  . TOTAL HIP ARTHROPLASTY Right 12/05/2018   Procedure: TOTAL HIP ARTHROPLASTY ANTERIOR APPROACH;  Surgeon: Frederik Pear, MD;  Location: [facility];  Service: Orthopedics;  Laterality: Right;    There were no vitals filed for this visit.   Subjective Assessment - 09/02/20 1153    Subjective Pt reports her shoulder has been clicking (but not painful).  She is getting bored at home; anxious to return to work.    Currently in Pain? Yes    Pain Score 2     Pain Location Shoulder   bicep and post shoulder   Pain Orientation Right    Pain Descriptors / Indicators Sore    Aggravating Factors  using arm too much    Pain Relieving Factors ice, medication              [facility] PT Assessment - 09/02/20 0001      Assessment   Medical Diagnosis Rt shoulder RCR; biceps tendonesis; DCR; SAC; extensive debridement     Referring Provider (PT) Dr Ophelia Charter     Onset Date/Surgical Date 07/15/20    Hand Dominance Right    Next MD Visit 10/06/20    Prior Therapy yes for shoulder and hip       PROM   Right  Shoulder Flexion 146 Degrees   supine with cane, AAROM   Right Shoulder External Rotation 71 Degrees   in scapular plane          [facility] Adult PT Treatment/Exercise - 09/02/20 0001      Shoulder Exercises: Supine   Horizontal ABduction AAROM;Right;5 reps   cane, to tolerance    External Rotation AAROM;Right;5 reps   cane, 15 sec holds   Flexion AAROM;Both;5 reps   cane, 10 sec hold   ABduction Right;AAROM;10 reps   cane   Other Supine Exercises scap squeeze/ thoracic lift x 5 sec x 5 reps      Shoulder Exercises: Standing   Extension AAROM;Both;10 reps   cane     Shoulder Exercises: Pulleys   Flexion 3 minutes    Scaption 3 minutes      Shoulder Exercises: Stretch   Internal Rotation Stretch 3 reps   20 sec, with strap   Star Gazer Stretch 20 seconds;2 reps      Vasopneumatic   Number Minutes Vasopneumatic  10 minutes    Vasopnuematic Location  Shoulder   Rt    Vasopneumatic Pressure Low    Vasopneumatic Temperature  34 deg       Manual Therapy   Soft tissue mobilization IASTM to Rt bicep brachii, brachioradialis, lateral deltoid; STM and TPR to Rt levator, middle trap, rhomboid - to decrease fascial restrictions and improve mobility.     Scapular Mobilization Rt shoulder in all planes                    PT Short Term Goals - 08/21/20 1640      PT SHORT TERM GOAL #1   Title Independent in initial exercise program    Time 6    Period Weeks    Status Achieved    Target Date 09/17/20      PT SHORT TERM GOAL #2   Title improve posture and alignment with patient demonstrating good posterior shoulder girdle contraction/control    Time 6    Period Weeks    Status On-going    Target Date 09/17/20      PT SHORT TERM GOAL #3   Title PROM to 140 deg flexion; 40 deg ER at neurtal; 60-80 deg abd without rotation per protocol    Time 6    Period Weeks    Status Achieved    Target Date 09/17/20             PT Long Term Goals - 08/06/20 1258      PT LONG TERM GOAL #1   Title Improve posture and alignment with patient to demonstrate improved upright posture with posterior shoulder girdle engaged    Time 12    Period Weeks    Status New    Target Date 10/29/20      PT LONG TERM GOAL #2   Title Increase AROM Rt shoulder to functional level with no pain    Time 12    Period Weeks    Status New    Target Date 10/29/20      PT LONG TERM GOAL #3   Title 4+/5 to 5/5 shoulder strength    Baseline -    Time 12    Period Weeks    Status New    Target Date 10/29/20      PT LONG TERM GOAL #4   Title Independent in HEP    Time 12    Period Weeks  Status New    Target Date 10/29/20      PT LONG TERM GOAL #5   Title Improve FOTO to 28% limitation 02/07/2019    Time 12    Period Weeks    Status New    Target Date 10/29/20                 Plan - 09/02/20 1302    Clinical Impression Statement Pt is now 7 wks s/p  Rt RTC repair.  Pt tolerated all AAROM exercise well, without increase in pain.  Improved Rt shoulder ER AAROM noted.  Pt had palpable tightness in Rt bicep brachii, levator and middle trap; improved with STM/IASTM.    Rehab Potential Good    PT Frequency 2x / week    PT Duration 12 weeks    PT Treatment/Interventions ADLs/Self Care Home Management;Aquatic Therapy;Cryotherapy;Electrical Stimulation;Iontophoresis 4mg /ml Dexamethasone;Moist Heat;Ultrasound;Therapeutic activities;Therapeutic exercise;Neuromuscular re-education;Manual techniques;Dry needling;Taping    PT Next Visit Plan exercise per protocol; manual work and PROM Rt shoulder as allowed by protocol; progress shoulder rehab per protocol.    PT Home Exercise Plan [id number]    Consulted and Agree with Plan of Care Patient           Patient will benefit from skilled therapeutic intervention in order to improve the following deficits and impairments:  Decreased range of motion, Increased muscle spasms, Impaired UE functional use, Decreased activity tolerance, Pain, Improper body mechanics, Decreased mobility, Decreased strength, Increased edema, Postural dysfunction, Impaired flexibility  Visit Diagnosis: Chronic right shoulder pain  Other symptoms and signs involving the musculoskeletal system  Abnormal posture     Problem List Patient Active Problem List   Diagnosis Date Noted  . Injury of right toe 03/09/2020  . Fracture of one rib, right side, subsequent encounter for fracture with routine healing 01/06/2020  . Cellulitis of leg 12/09/2018  . History of total hip arthroplasty, right 12/05/2018  . Avascular necrosis ([place]) 09/10/2018  . Acute right hip pain 08/15/2018  . 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 Chronic right shoulder pain 07/23/2018  . Morbid obesity ([place]) 12/25/2017  . Family history of autoimmune disorder 12/18/2017  . Transaminitis 05/05/2017  . Lung nodule < 6cm on CT 05/05/2017  .  Fever 04/27/2017  . PSVT (paroxysmal supraventricular tachycardia) ([place]) 03/13/2017  . Tobacco use 03/13/2017  . Palpitations 02/10/2017  . GAD (generalized anxiety disorder) 02/10/2017  . Hypertension 02/10/2017  . Abnormal thyroid blood test 02/10/2017   [name], PTA 09/02/20 1:08 PM  [facility] [place], [place], [address] Phone: [phone]   Fax:  [phone]  Name: ALFHILD PARTCH MRN: [id number] Date of Birth: May 17, 1976

## 2020-09-04 ENCOUNTER — Ambulatory Visit (INDEPENDENT_AMBULATORY_CARE_PROVIDER_SITE_OTHER): Payer: No Typology Code available for payment source | Admitting: Rehabilitative and Restorative Service Providers"

## 2020-09-04 ENCOUNTER — Other Ambulatory Visit: Payer: Self-pay

## 2020-09-04 ENCOUNTER — Encounter: Payer: Self-pay | Admitting: Rehabilitative and Restorative Service Providers"

## 2020-09-04 DIAGNOSIS — M25511 Pain in right shoulder: Secondary | ICD-10-CM

## 2020-09-04 DIAGNOSIS — R531 Weakness: Secondary | ICD-10-CM | POA: Diagnosis not present

## 2020-09-04 DIAGNOSIS — G8929 Other chronic pain: Secondary | ICD-10-CM

## 2020-09-04 DIAGNOSIS — M25611 Stiffness of right shoulder, not elsewhere classified: Secondary | ICD-10-CM

## 2020-09-04 DIAGNOSIS — R29898 Other symptoms and signs involving the musculoskeletal system: Secondary | ICD-10-CM

## 2020-09-04 DIAGNOSIS — R293 Abnormal posture: Secondary | ICD-10-CM

## 2020-09-04 NOTE — Therapy (Signed)
[place], [place], [address] Phone: [phone]   Fax:  [phone]  Physical Therapy Treatment  Patient Details  Name: Deborah Goodman MRN: [id number] Date of Birth: 1976/12/05 Referring Provider (PT): Dr Ophelia Charter    Encounter Date: 09/04/2020   PT End of Session - 09/04/20 1022    Visit Number 8    Number of Visits 24    Date for PT Re-Evaluation 10/29/20    PT Start Time 1018    PT Stop Time 1100    PT Time Calculation (min) 42 min    Activity Tolerance Patient tolerated treatment well           Past Medical History:  Diagnosis Date  . Acid reflux   . Avascular necrosis ([place]) 08/2018   Right hip  . Complication of anesthesia    needs glide scope because trachea sit to the side not in the middle after SI surgery did not have a voice  . Depression   . Fatty liver 04/2017   noted on CT Chest  . Fibromyalgia   . GAD (generalized anxiety disorder) 02/10/2017  . Hypertension 02/10/2017  . Lung nodule 04/2017   Stable 4 mm nodule in the periphery of the lateral segment , right, noted on CT Chest  . Palpitations 02/10/2017   a. 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   . Pneumonia 01/2018  . Raynaud's syndrome   . Splenomegaly 04/2017   noted on CT Chest  . SVT (supraventricular tachycardia) ([place])   . Systolic murmur   . Tendinopathy of rotator cuff    Right  . Tobacco use     Past Surgical History:  Procedure Laterality Date  . SACRO-ILIAC PINNING  2016   fusion, in [place]  . SHOULDER ARTHROSCOPY WITH OPEN ROTATOR CUFF REPAIR AND DISTAL CLAVICLE ACROMINECTOMY Right 07/15/2020   Procedure: SHOULDER ARTHROSCOPY WITH ROTATOR CUFF REPAIR AND DISTAL CLAVICLE [place] AND BICEPS TENDOESIS;  Surgeon: [name], MD;  Location: [facility];  Service: Orthopedics;  Laterality: Right;  . TONSILLECTOMY     age 44  . TOTAL HIP ARTHROPLASTY Right 12/05/2018    Procedure: TOTAL HIP ARTHROPLASTY ANTERIOR APPROACH;  Surgeon: Frederik Pear, MD;  Location: [facility];  Service: Orthopedics;  Laterality: Right;    There were no vitals filed for this visit.   Subjective Assessment - 09/04/20 1022    Subjective Shoulder is aching and sore - may be due to weather - started aching last night    Currently in Pain? Yes    Pain Score 3     Pain Location Shoulder    Pain Orientation Right    Pain Descriptors / Indicators Aching;Sore                             [facility] Adult PT Treatment/Exercise - 09/04/20 0001      Shoulder Exercises: Supine   Other Supine Exercises scap squeeze/ thoracic lift x 5 sec x 5 reps      Shoulder Exercises: Seated   Other Seated Exercises scap squueze x 5 sec x 10 reps;  scap depression with Rt hand on coregeous ball x 5 sec x 10 reps     Other Seated Exercises scapular depression pressing into large swiss ball 5 sec hold x 10 reps x 2 sets       Shoulder Exercises: Pulleys  Flexion 3 minutes    Scaption 3 minutes      Shoulder Exercises: Therapy Ball   Flexion Both;5 reps   20 sec hold rolling ball on table for stretch into shd flex     Shoulder Exercises: Stretch   Other Shoulder Stretches prolonged snow angel       Vasopneumatic   Number Minutes Vasopneumatic  10 minutes    Vasopnuematic Location  Shoulder   Rt   Vasopneumatic Pressure Low    Vasopneumatic Temperature  34 deg       Manual Therapy   Manual therapy comments pt supine     Soft tissue mobilization through the pecs; anterior deltoid; biceps; upper trap; leveator     Myofascial Release anterior chest/shoulder     Scapular Mobilization Rt shoulder in all planes    Passive ROM Rt shoulder flexion, scaption, ER to tissue limits and no pain.       Neck Exercises: Stretches   Upper Trapezius Stretch Right;5 reps;10 seconds   stretching to Lt                    PT Short Term Goals - 08/21/20 1640      PT SHORT TERM GOAL #1     Title Independent in initial exercise program    Time 6    Period Weeks    Status Achieved    Target Date 09/17/20      PT SHORT TERM GOAL #2   Title improve posture and alignment with patient demonstrating good posterior shoulder girdle contraction/control    Time 6    Period Weeks    Status On-going    Target Date 09/17/20      PT SHORT TERM GOAL #3   Title PROM to 140 deg flexion; 40 deg ER at neurtal; 60-80 deg abd without rotation per protocol    Time 6    Period Weeks    Status Achieved    Target Date 09/17/20             PT Long Term Goals - 08/06/20 1258      PT LONG TERM GOAL #1   Title Improve posture and alignment with patient to demonstrate improved upright posture with posterior shoulder girdle engaged    Time 12    Period Weeks    Status New    Target Date 10/29/20      PT LONG TERM GOAL #2   Title Increase AROM Rt shoulder to functional level with no pain    Time 12    Period Weeks    Status New    Target Date 10/29/20      PT LONG TERM GOAL #3   Title 4+/5 to 5/5 shoulder strength    Baseline -    Time 12    Period Weeks    Status New    Target Date 10/29/20      PT LONG TERM GOAL #4   Title Independent in HEP    Time 12    Period Weeks    Status New    Target Date 10/29/20      PT LONG TERM GOAL #5   Title Improve FOTO to 28% limitation 02/07/2019    Time 12    Period Weeks    Status New    Target Date 10/29/20                 Plan - 09/04/20 1023    Clinical  Impression Statement Conitnued work on scapular strengthening - adding scapular depression and continuing with isometric strengthening. Muscular tightness continues through the anterior chest. Forward posture continues.    Rehab Potential Good    PT Frequency 2x / week    PT Duration 12 weeks    PT Treatment/Interventions ADLs/Self Care Home Management;Aquatic Therapy;Cryotherapy;Electrical Stimulation;Iontophoresis 4mg /ml Dexamethasone;Moist  Heat;Ultrasound;Therapeutic activities;Therapeutic exercise;Neuromuscular re-education;Manual techniques;Dry needling;Taping    PT Next Visit Plan exercise per protocol; manual work and PROM Rt shoulder as allowed by protocol; progress shoulder rehab per protocol.    PT Home Exercise Plan [id number]    Consulted and Agree with Plan of Care Patient           Patient will benefit from skilled therapeutic intervention in order to improve the following deficits and impairments:     Visit Diagnosis: Chronic right shoulder pain  Other symptoms and signs involving the musculoskeletal system  Abnormal posture  Weakness generalized  Stiffness of joint, shoulder region, right     Problem List Patient Active Problem List   Diagnosis Date Noted  . Injury of right toe 03/09/2020  . Fracture of one rib, right side, subsequent encounter for fracture with routine healing 01/06/2020  . Cellulitis of leg 12/09/2018  . History of total hip arthroplasty, right 12/05/2018  . Avascular necrosis ([place]) 09/10/2018  . Acute right hip pain 08/15/2018  . 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 Chronic right shoulder pain 07/23/2018  . Morbid obesity ([place]) 12/25/2017  . Family history of autoimmune disorder 12/18/2017  . Transaminitis 05/05/2017  . Lung nodule < 6cm on CT 05/05/2017  . Fever 04/27/2017  . PSVT (paroxysmal supraventricular tachycardia) ([place]) 03/13/2017  . Tobacco use 03/13/2017  . Palpitations 02/10/2017  . GAD (generalized anxiety disorder) 02/10/2017  . Hypertension 02/10/2017  . Abnormal thyroid blood test 02/10/2017    Shakedra Beam Nilda Simmer PT, MPH  09/04/2020, 11:00 AM  [facility] [place], [place], [address] Phone: [phone]   Fax:  [phone]  Name: AVIE CHECO MRN: [id number] Date of Birth: [birth year]

## 2020-09-09 ENCOUNTER — Ambulatory Visit (INDEPENDENT_AMBULATORY_CARE_PROVIDER_SITE_OTHER): Payer: No Typology Code available for payment source | Admitting: Physical Therapy

## 2020-09-09 ENCOUNTER — Other Ambulatory Visit: Payer: Self-pay

## 2020-09-09 DIAGNOSIS — R293 Abnormal posture: Secondary | ICD-10-CM

## 2020-09-09 DIAGNOSIS — M25511 Pain in right shoulder: Secondary | ICD-10-CM

## 2020-09-09 DIAGNOSIS — R29898 Other symptoms and signs involving the musculoskeletal system: Secondary | ICD-10-CM

## 2020-09-09 DIAGNOSIS — G8929 Other chronic pain: Secondary | ICD-10-CM

## 2020-09-09 DIAGNOSIS — R531 Weakness: Secondary | ICD-10-CM | POA: Diagnosis not present

## 2020-09-09 NOTE — Patient Instructions (Signed)
Access Code: [email or web address]: [email or web address]: 09/22/2021Prepared by: [place]  Circular Shoulder Pendulum with Table Support - 3-4 x daily - 7 x weekly - 1 sets - 20-30 reps  Seated Scapular Retraction - 2 x daily - 7 x weekly - 1-2 sets - 10 reps - 10 sec hold  Seated Cervical Sidebending AROM - 2 x daily - 7 x weekly - 1 sets - 5 reps - 5-10 sec hold  Seated Shoulder Flexion Towel Slide at Table Top Full Range of Motion - 2 x daily - 7 x weekly - 1 sets - 5 reps - 5-10 sec hold  Supine Shoulder Abduction AAROM with [place]el - 2 x daily - 7 x weekly - 1 sets - 10 reps - 5 hold  Supine Shoulder External Rotation in 45 Degrees Abduction AAROM with Dowel - 2 x daily - 7 x weekly - 1 sets - 10 reps - 5 hold  Standing Shoulder Extension with Dowel - 2 x daily - 7 x weekly - 1 sets - 10 reps - 5 hold  Standing Shoulder Internal Rotation AAROM with [place]el - 2 x daily - 7 x weekly - 1 sets - 10 reps - 5 hold  Seated Shoulder Setting - 2 x daily - 7 x weekly - 1 sets - 10 reps - 5 hold  Standing Isometric Shoulder External Rotation with Doorway - 2 x daily - 7 x weekly - 1 sets - 10 reps - 5-10 sec hold  Standing Isometric Shoulder Internal Rotation with Towel Roll at Doorway - 2 x daily - 7 x weekly - 1 sets - 10 reps - 5-10 sec hold  Standing Isometric Shoulder Extension with Doorway - Arm Bent - 2 x daily - 7 x weekly - 1 sets - 10 reps - 5-10 sec hold  Standing Isometric Shoulder Abduction with Doorway - Arm Bent - 2 x daily - 7 x weekly - 1 sets - 10 reps - 5-10 sec hold  Standing Isometric Shoulder Flexion with Doorway - Arm Bent - 2 x daily - 7 x weekly - 1 sets - 10 reps - 5-10 sec hold

## 2020-09-09 NOTE — Therapy (Signed)
[place], [place], [address] Phone: [phone]   Fax:  [phone]  Physical Therapy Treatment  Patient Details  Name: Deborah Goodman MRN: [id number] Date of Birth: 04/21/1976 Referring Provider (PT): Dr Ophelia Charter    Encounter Date: 09/09/2020   PT End of Session - 09/09/20 1154    Visit Number 9    Number of Visits 24    Date for PT Re-Evaluation 10/29/20    PT Start Time 1151    PT Stop Time 1233    PT Time Calculation (min) 42 min    Activity Tolerance Patient tolerated treatment well           Past Medical History:  Diagnosis Date  . Acid reflux   . Avascular necrosis ([place]) 08/2018   Right hip  . Complication of anesthesia    needs glide scope because trachea sit to the side not in the middle after SI surgery did not have a voice  . Depression   . Fatty liver 04/2017   noted on CT Chest  . Fibromyalgia   . GAD (generalized anxiety disorder) 02/10/2017  . Hypertension 02/10/2017  . Lung nodule 04/2017   Stable 4 mm nodule in the periphery of the lateral segment , right, noted on CT Chest  . Palpitations 02/10/2017   a. 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   . Pneumonia 01/2018  . Raynaud's syndrome   . Splenomegaly 04/2017   noted on CT Chest  . SVT (supraventricular tachycardia) ([place])   . Systolic murmur   . Tendinopathy of rotator cuff    Right  . Tobacco use     Past Surgical History:  Procedure Laterality Date  . SACRO-ILIAC PINNING  2016   fusion, in [place]  . SHOULDER ARTHROSCOPY WITH OPEN ROTATOR CUFF REPAIR AND DISTAL CLAVICLE ACROMINECTOMY Right 07/15/2020   Procedure: SHOULDER ARTHROSCOPY WITH ROTATOR CUFF REPAIR AND DISTAL CLAVICLE [place] AND BICEPS TENDOESIS;  Surgeon: [name], MD;  Location: [facility];  Service: Orthopedics;  Laterality: Right;  . TONSILLECTOMY     age 30  . TOTAL HIP ARTHROPLASTY Right 12/05/2018    Procedure: TOTAL HIP ARTHROPLASTY ANTERIOR APPROACH;  Surgeon: Frederik Pear, MD;  Location: [facility];  Service: Orthopedics;  Laterality: Right;    There were no vitals filed for this visit.   Subjective Assessment - 09/09/20 1154    Subjective Pt reports her Rt shoulder is sore, but better than last week.  She has been doing the counter stretch (forward flexion), pendulum, and upper trap.  She has had more Rt neck soreness/tightness lately.    Currently in Pain? Yes    Pain Score 2     Pain Location Shoulder    Pain Orientation Right    Pain Descriptors / Indicators Sore    Aggravating Factors  using arm too much    Pain Relieving Factors ice, medication              [facility] PT Assessment - 09/09/20 0001      Assessment   Medical Diagnosis Rt shoulder RCR; biceps tendonesis; DCR; SAC; extensive debridement     Referring Provider (PT) Dr Ophelia Charter     Onset Date/Surgical Date 07/15/20    Hand Dominance Right    Next MD Visit 10/06/20    Prior Therapy yes for shoulder and hip            [facility]  Adult PT Treatment/Exercise - 09/09/20 0001      Shoulder Exercises: Seated   Other Seated Exercises shoulder rolls x 10     Other Seated Exercises scapular depression pressing into large swiss ball 5 sec hold x 10 reps  - multiple cues for proper engagement.     Shoulder Exercises: Standing   Other Standing Exercises Rt shoulder scaption x 10 reps to 90 deg, eccentric lowering, mirror for visual feedback.     Other Standing Exercises pendulum with RUE x 10 reps      Shoulder Exercises: Pulleys   Flexion 3 minutes    Scaption 3 minutes      Shoulder Exercises: Isometric Strengthening   Flexion 5X10"    Extension 5X10"    External Rotation 5X10"    Internal Rotation 5X10"      Shoulder Exercises: Stretch   Other Shoulder Stretches Rt bicep stretch x 20 sec x 3 reps      Vasopneumatic   Number Minutes Vasopneumatic  10 minutes    Vasopnuematic Location  Shoulder   Rt    Vasopneumatic Pressure Low    Vasopneumatic Temperature  34 deg                   PT Education - 09/09/20 1226    Education Details HEP - added isometrics    Person(s) Educated Patient    Methods Explanation;Handout;Demonstration;Verbal cues;Tactile cues    Comprehension Verbalized understanding;Returned demonstration            PT Short Term Goals - 08/21/20 1640      PT SHORT TERM GOAL #1   Title Independent in initial exercise program    Time 6    Period Weeks    Status Achieved    Target Date 09/17/20      PT SHORT TERM GOAL #2   Title improve posture and alignment with patient demonstrating good posterior shoulder girdle contraction/control    Time 6    Period Weeks    Status On-going    Target Date 09/17/20      PT SHORT TERM GOAL #3   Title PROM to 140 deg flexion; 40 deg ER at neurtal; 60-80 deg abd without rotation per protocol    Time 6    Period Weeks    Status Achieved    Target Date 09/17/20             PT Long Term Goals - 09/09/20 1251      PT LONG TERM GOAL #1   Title Improve posture and alignment with patient to demonstrate improved upright posture with posterior shoulder girdle engaged    Time 12    Period Weeks    Status On-going      PT LONG TERM GOAL #2   Title Increase AROM Rt shoulder to functional level with no pain    Time 12    Period Weeks    Status On-going      PT LONG TERM GOAL #3   Title 4+/5 to 5/5 shoulder strength    Baseline -    Time 12    Period Weeks    Status On-going      PT LONG TERM GOAL #4   Title Independent in HEP    Time 12    Period Weeks    Status On-going      PT LONG TERM GOAL #5   Title Improve FOTO to 28% limitation 02/07/2019    Time 12  Period Weeks    Status On-going                 Plan - 09/09/20 1209    Clinical Impression Statement Added rotator cuff and deltoid isometrics without difficulty or discomfort.  Progressing well within rehab protocol.    Rehab  Potential Good    PT Frequency 2x / week    PT Duration 12 weeks    PT Treatment/Interventions ADLs/Self Care Home Management;Aquatic Therapy;Cryotherapy;Electrical Stimulation;Iontophoresis 4mg /ml Dexamethasone;Moist Heat;Ultrasound;Therapeutic activities;Therapeutic exercise;Neuromuscular re-education;Manual techniques;Dry needling;Taping    PT Next Visit Plan progress shoulder rehab per protocol.    PT Home Exercise Plan [email or web address]    Consulted and Agree with Plan of Care Patient           Patient will benefit from skilled therapeutic intervention in order to improve the following deficits and impairments:  Decreased range of motion, Increased muscle spasms, Impaired UE functional use, Decreased activity tolerance, Pain, Improper body mechanics, Decreased mobility, Decreased strength, Increased edema, Postural dysfunction, Impaired flexibility  Visit Diagnosis: Chronic right shoulder pain  Other symptoms and signs involving the musculoskeletal system  Abnormal posture  Weakness generalized     Problem List Patient Active Problem List   Diagnosis Date Noted  . Injury of right toe 03/09/2020  . Fracture of one rib, right side, subsequent encounter for fracture with routine healing 01/06/2020  . Cellulitis of leg 12/09/2018  . History of total hip arthroplasty, right 12/05/2018  . Avascular necrosis ([place]) 09/10/2018  . Acute right hip pain 08/15/2018  . 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 Chronic right shoulder pain 07/23/2018  . Morbid obesity ([place]) 12/25/2017  . Family history of autoimmune disorder 12/18/2017  . Transaminitis 05/05/2017  . Lung nodule < 6cm on CT 05/05/2017  . Fever 04/27/2017  . PSVT (paroxysmal supraventricular tachycardia) ([place]) 03/13/2017  . Tobacco use 03/13/2017  . Palpitations 02/10/2017  . GAD (generalized anxiety disorder) 02/10/2017  . Hypertension 02/10/2017  . Abnormal thyroid blood test 02/10/2017    [name], PTA 09/09/20 1:19 PM  [facility] [place], [place], [address] Phone: [phone]   Fax:  [phone]  Name: REIZY DUNLOW MRN: [id number] Date of Birth: 01-29-1976

## 2020-09-11 ENCOUNTER — Ambulatory Visit (INDEPENDENT_AMBULATORY_CARE_PROVIDER_SITE_OTHER): Payer: No Typology Code available for payment source | Admitting: Physical Therapy

## 2020-09-11 ENCOUNTER — Other Ambulatory Visit: Payer: Self-pay

## 2020-09-11 DIAGNOSIS — R293 Abnormal posture: Secondary | ICD-10-CM | POA: Diagnosis not present

## 2020-09-11 DIAGNOSIS — M25511 Pain in right shoulder: Secondary | ICD-10-CM | POA: Diagnosis not present

## 2020-09-11 DIAGNOSIS — R29898 Other symptoms and signs involving the musculoskeletal system: Secondary | ICD-10-CM | POA: Diagnosis not present

## 2020-09-11 DIAGNOSIS — R531 Weakness: Secondary | ICD-10-CM | POA: Diagnosis not present

## 2020-09-11 DIAGNOSIS — G8929 Other chronic pain: Secondary | ICD-10-CM

## 2020-09-11 NOTE — Therapy (Signed)
[place], [place], [address] Phone: [phone]   Fax:  [phone]  Physical Therapy Treatment  Patient Details  Name: Deborah Goodman MRN: [id number] Date of Birth: 1976/08/18 Referring Provider (PT): Dr Ophelia Charter    Encounter Date: 09/11/2020   PT End of Session - 09/11/20 1019    Visit Number 10    Number of Visits 24    Date for PT Re-Evaluation 10/29/20    PT Start Time 4132    PT Stop Time 1105    PT Time Calculation (min) 51 min    Activity Tolerance Patient tolerated treatment well           Past Medical History:  Diagnosis Date  . Acid reflux   . Avascular necrosis ([place]) 08/2018   Right hip  . Complication of anesthesia    needs glide scope because trachea sit to the side not in the middle after SI surgery did not have a voice  . Depression   . Fatty liver 04/2017   noted on CT Chest  . Fibromyalgia   . GAD (generalized anxiety disorder) 02/10/2017  . Hypertension 02/10/2017  . Lung nodule 04/2017   Stable 4 mm nodule in the periphery of the lateral segment , right, noted on CT Chest  . Palpitations 02/10/2017   a. 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   . Pneumonia 01/2018  . Raynaud's syndrome   . Splenomegaly 04/2017   noted on CT Chest  . SVT (supraventricular tachycardia) ([place])   . Systolic murmur   . Tendinopathy of rotator cuff    Right  . Tobacco use     Past Surgical History:  Procedure Laterality Date  . SACRO-ILIAC PINNING  2016   fusion, in [place]  . SHOULDER ARTHROSCOPY WITH OPEN ROTATOR CUFF REPAIR AND DISTAL CLAVICLE ACROMINECTOMY Right 07/15/2020   Procedure: SHOULDER ARTHROSCOPY WITH ROTATOR CUFF REPAIR AND DISTAL CLAVICLE [place] AND BICEPS TENDOESIS;  Surgeon: [name], MD;  Location: [facility];  Service: Orthopedics;  Laterality: Right;  . TONSILLECTOMY     age 44  . TOTAL HIP ARTHROPLASTY Right 12/05/2018    Procedure: TOTAL HIP ARTHROPLASTY ANTERIOR APPROACH;  Surgeon: Frederik Pear, MD;  Location: [facility];  Service: Orthopedics;  Laterality: Right;    There were no vitals filed for this visit.   Subjective Assessment - 09/11/20 1020    Subjective Pt reports her Rt shoulder feels pretty good.  She has a question on how to perform one of the new (isometric) exercises.    Currently in Pain? Yes    Pain Score 1     Pain Location Shoulder    Pain Orientation Right              [facility] PT Assessment - 09/11/20 0001      Assessment   Medical Diagnosis Rt shoulder RCR; biceps tendonesis; DCR; [place]; extensive debridement     Referring Provider (PT) Dr Ophelia Charter     Onset Date/Surgical Date 07/15/20    Hand Dominance Right    Next MD Visit 10/06/20    Prior Therapy yes for shoulder and hip       AROM   Right/Left Shoulder Right    Right Shoulder Extension 25 Degrees    Right Shoulder Flexion 150 Degrees    Right Shoulder ABduction 140 Degrees    Right Shoulder Internal Rotation --   thumb to T10  Right Shoulder External Rotation 66 Degrees   scaption to 70 deg, taken in standing.           [facility] Adult PT Treatment/Exercise - 09/11/20 0001      Shoulder Exercises: Supine   External Rotation AAROM;Right;5 reps   cane, 10 sec holds     Shoulder Exercises: Standing   Internal Rotation AAROM;Both;5 reps   cane   Extension AAROM;Both;10 reps   cane   Other Standing Exercises wall ladder in Rt scaption x 3 reps (to #26) with cues to keep shoulder from hiking.     Other Standing Exercises Rings on arch (to shoulder flexion 110 deg) with 12 rings Lt to/from Rt       Shoulder Exercises: Pulleys   Flexion 3 minutes    Scaption 3 minutes      Shoulder Exercises: Isometric Strengthening   Flexion 5X10"    Extension 5X10"    External Rotation 5X10"    Internal Rotation 5X10"    ABduction 5X10"      Shoulder Exercises: Stretch   Star Gazer Stretch 2 reps;20 seconds    Other Shoulder  Stretches Rt bicep stretch x 20 sec x 3 reps      Vasopneumatic   Number Minutes Vasopneumatic  10 minutes    Vasopnuematic Location  Shoulder   Rt   Vasopneumatic Pressure Low    Vasopneumatic Temperature  34 deg       Manual Therapy   Soft tissue mobilization Rt pec, ant shoulder.     Passive ROM Rt shoulder into ext and ER, to tissue limits and no pain.                     PT Short Term Goals - 08/21/20 1640      PT SHORT TERM GOAL #1   Title Independent in initial exercise program    Time 6    Period Weeks    Status Achieved    Target Date 09/17/20      PT SHORT TERM GOAL #2   Title improve posture and alignment with patient demonstrating good posterior shoulder girdle contraction/control    Time 6    Period Weeks    Status On-going    Target Date 09/17/20      PT SHORT TERM GOAL #3   Title PROM to 140 deg flexion; 40 deg ER at neurtal; 60-80 deg abd without rotation per protocol    Time 6    Period Weeks    Status Achieved    Target Date 09/17/20             PT Long Term Goals - 09/09/20 1251      PT LONG TERM GOAL #1   Title Improve posture and alignment with patient to demonstrate improved upright posture with posterior shoulder girdle engaged    Time 12    Period Weeks    Status On-going      PT LONG TERM GOAL #2   Title Increase AROM Rt shoulder to functional level with no pain    Time 12    Period Weeks    Status On-going      PT LONG TERM GOAL #3   Title 4+/5 to 5/5 shoulder strength    Baseline -    Time 12    Period Weeks    Status On-going      PT LONG TERM GOAL #4   Title Independent in HEP    Time  12    Period Weeks    Status On-going      PT LONG TERM GOAL #5   Title Improve FOTO to 28% limitation 02/07/2019    Time 12    Period Weeks    Status On-going                 Plan - 09/11/20 1059    Clinical Impression Statement Pt required minor cues for rotator cuff isometrics. All exercises tolerated without  pain.  Pt demonstrates improved Rt shoulder flexion, abdct, and IR ROM; limited with ER and ext.  Progressing towards LTGs.    Rehab Potential Good    PT Frequency 2x / week    PT Duration 12 weeks    PT Treatment/Interventions ADLs/Self Care Home Management;Aquatic Therapy;Cryotherapy;Electrical Stimulation;Iontophoresis 4mg /ml Dexamethasone;Moist Heat;Ultrasound;Therapeutic activities;Therapeutic exercise;Neuromuscular re-education;Manual techniques;Dry needling;Taping    PT Next Visit Plan progress shoulder rehab per protocol.    PT Home Exercise Plan [id number]    Consulted and Agree with Plan of Care Patient           Patient will benefit from skilled therapeutic intervention in order to improve the following deficits and impairments:  Decreased range of motion, Increased muscle spasms, Impaired UE functional use, Decreased activity tolerance, Pain, Improper body mechanics, Decreased mobility, Decreased strength, Increased edema, Postural dysfunction, Impaired flexibility  Visit Diagnosis: Chronic right shoulder pain  Other symptoms and signs involving the musculoskeletal system  Abnormal posture  Weakness generalized     Problem List Patient Active Problem List   Diagnosis Date Noted  . Injury of right toe 03/09/2020  . Fracture of one rib, right side, subsequent encounter for fracture with routine healing 01/06/2020  . Cellulitis of leg 12/09/2018  . History of total hip arthroplasty, right 12/05/2018  . Avascular necrosis ([place]) 09/10/2018  . Acute right hip pain 08/15/2018  . 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 Chronic right shoulder pain 07/23/2018  . Morbid obesity ([place]) 12/25/2017  . Family history of autoimmune disorder 12/18/2017  . Transaminitis 05/05/2017  . Lung nodule < 6cm on CT 05/05/2017  . Fever 04/27/2017  . PSVT (paroxysmal supraventricular tachycardia) ([place]) 03/13/2017  . Tobacco use 03/13/2017  . Palpitations 02/10/2017  . GAD  (generalized anxiety disorder) 02/10/2017  . Hypertension 02/10/2017  . Abnormal thyroid blood test 02/10/2017   [name], PTA 09/11/20 11:02 AM  [place], [place], [address] Phone: [phone]   Fax:  [phone]  Name: SAPHIA VANDERFORD MRN: [id number] Date of Birth: [birth year]

## 2020-09-16 ENCOUNTER — Encounter: Payer: Self-pay | Admitting: Physical Therapy

## 2020-09-16 ENCOUNTER — Other Ambulatory Visit: Payer: Self-pay

## 2020-09-16 ENCOUNTER — Ambulatory Visit (INDEPENDENT_AMBULATORY_CARE_PROVIDER_SITE_OTHER): Payer: No Typology Code available for payment source | Admitting: Physical Therapy

## 2020-09-16 DIAGNOSIS — R293 Abnormal posture: Secondary | ICD-10-CM | POA: Diagnosis not present

## 2020-09-16 DIAGNOSIS — R531 Weakness: Secondary | ICD-10-CM | POA: Diagnosis not present

## 2020-09-16 DIAGNOSIS — R29898 Other symptoms and signs involving the musculoskeletal system: Secondary | ICD-10-CM

## 2020-09-16 DIAGNOSIS — M25511 Pain in right shoulder: Secondary | ICD-10-CM | POA: Diagnosis not present

## 2020-09-16 DIAGNOSIS — G8929 Other chronic pain: Secondary | ICD-10-CM

## 2020-09-16 NOTE — Therapy (Signed)
[place], [place], [address] Phone: [phone]   Fax:  [phone]  Physical Therapy Treatment  Patient Details  Name: Deborah Goodman MRN: [id number] Date of Birth: April 13, 1976 Referring Provider (PT): Dr Ophelia Charter    Encounter Date: 09/16/2020   PT End of Session - 09/16/20 1352    Visit Number 11    Number of Visits 24    Date for PT Re-Evaluation 10/29/20    PT Start Time 5732    PT Stop Time 1434    PT Time Calculation (min) 45 min    Activity Tolerance Patient tolerated treatment well    Behavior During Therapy [facility] for tasks assessed/performed           Past Medical History:  Diagnosis Date  . Acid reflux   . Avascular necrosis ([place]) 08/2018   Right hip  . Complication of anesthesia    needs glide scope because trachea sit to the side not in the middle after SI surgery did not have a voice  . Depression   . Fatty liver 04/2017   noted on CT Chest  . Fibromyalgia   . GAD (generalized anxiety disorder) 02/10/2017  . Hypertension 02/10/2017  . Lung nodule 04/2017   Stable 4 mm nodule in the periphery of the lateral segment , right, noted on CT Chest  . Palpitations 02/10/2017   a. 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   . Pneumonia 01/2018  . Raynaud's syndrome   . Splenomegaly 04/2017   noted on CT Chest  . SVT (supraventricular tachycardia) ([place])   . Systolic murmur   . Tendinopathy of rotator cuff    Right  . Tobacco use     Past Surgical History:  Procedure Laterality Date  . SACRO-ILIAC PINNING  2016   fusion, in [place]  . SHOULDER ARTHROSCOPY WITH OPEN ROTATOR CUFF REPAIR AND DISTAL CLAVICLE ACROMINECTOMY Right 07/15/2020   Procedure: SHOULDER ARTHROSCOPY WITH ROTATOR CUFF REPAIR AND DISTAL CLAVICLE [place] AND BICEPS TENDOESIS;  Surgeon: [name], MD;  Location: [facility];  Service: Orthopedics;  Laterality: Right;  . TONSILLECTOMY      age 44  . TOTAL HIP ARTHROPLASTY Right 12/05/2018   Procedure: TOTAL HIP ARTHROPLASTY ANTERIOR APPROACH;  Surgeon: Frederik Pear, MD;  Location: [facility];  Service: Orthopedics;  Laterality: Right;    There were no vitals filed for this visit.   Subjective Assessment - 09/16/20 1353    Subjective Pt reports her Rt shoulder is feeling better.  She has been doing her exercises 2x/day. She states she is able to bring her arm back (in ext) a little easier now.    Currently in Pain? Yes    Pain Score 1    with movement; no pain at rest   Pain Location Shoulder    Pain Orientation Right    Pain Descriptors / Indicators Sore    Aggravating Factors  using arm too much    Pain Relieving Factors ice, medication              [facility] PT Assessment - 09/16/20 0001      Assessment   Medical Diagnosis Rt shoulder RCR; biceps tendonesis; DCR; [place]; extensive debridement     Referring Provider (PT) Dr Ophelia Charter     Onset Date/Surgical Date 07/15/20    Hand Dominance Right    Next MD Visit 10/06/20    Prior Therapy yes for shoulder  and hip       AROM   Right/Left Shoulder Right    Right Shoulder Extension 34 Degrees            [facility] Adult PT Treatment/Exercise - 09/16/20 0001      Shoulder Exercises: Supine   Protraction AROM;Both;10 reps    Flexion AROM;Both;10 reps   holding orange pball.    Diagonals 10 reps;AROM   2 sets, holding orange pball    Other Supine Exercises scap squeeze/ thoracic lift x 5 sec x 5 reps    Other Supine Exercises snow angels, range to tolerance x 4 reps.  Horiz abdct/add holding orange pball x 10       Shoulder Exercises: Standing   Other Standing Exercises wall ladder in Rt scaption x 3 reps (to #26) with cues to keep shoulder from hiking.     Other Standing Exercises Rings on arch (to shoulder flexion 120 deg) with 12 rings Lt to/from Rt       Shoulder Exercises: Pulleys   Flexion --   5 reps, 10 sec hold   Scaption --   5 reps, 10 sec hold      Shoulder Exercises: Therapy Ball   Flexion Both;5 reps   rolling ball on table for stretch into shd flex   Flexion Limitations then bouncing ball slightly overhead x 1 min       Shoulder Exercises: ROM/Strengthening   Rhythmic Stabilization, Supine 110, 90, and 50 deg flexion with Rt arm x 20 sec each       Shoulder Exercises: Stretch   Star Gazer Stretch 2 reps;20 seconds    Other Shoulder Stretches Rt bicep stretch x 20 sec x 3 reps      Vasopneumatic   Number Minutes Vasopneumatic  10 minutes    Vasopnuematic Location  Shoulder   Rt   Vasopneumatic Pressure Low    Vasopneumatic Temperature  34 deg                     PT Short Term Goals - 08/21/20 1640      PT SHORT TERM GOAL #1   Title Independent in initial exercise program    Time 6    Period Weeks    Status Achieved    Target Date 09/17/20      PT SHORT TERM GOAL #2   Title improve posture and alignment with patient demonstrating good posterior shoulder girdle contraction/control    Time 6    Period Weeks    Status On-going    Target Date 09/17/20      PT SHORT TERM GOAL #3   Title PROM to 140 deg flexion; 40 deg ER at neurtal; 60-80 deg abd without rotation per protocol    Time 6    Period Weeks    Status Achieved    Target Date 09/17/20             PT Long Term Goals - 09/09/20 1251      PT LONG TERM GOAL #1   Title Improve posture and alignment with patient to demonstrate improved upright posture with posterior shoulder girdle engaged    Time 12    Period Weeks    Status On-going      PT LONG TERM GOAL #2   Title Increase AROM Rt shoulder to functional level with no pain    Time 12    Period Weeks    Status On-going  PT LONG TERM GOAL #3   Title 4+/5 to 5/5 shoulder strength    Baseline -    Time 12    Period Weeks    Status On-going      PT LONG TERM GOAL #4   Title Independent in HEP    Time 12    Period Weeks    Status On-going      PT LONG TERM GOAL #5   Title  Improve FOTO to 28% limitation 02/07/2019    Time 12    Period Weeks    Status On-going                 Plan - 09/16/20 1424    Clinical Impression Statement Pt gained a few more degrees of Rt shoulder ext AROM today.  She tolerated Rt shoulder ROM and isometrics well without increase in pain, just fatigue.  Progressing well within rehab protocol.    Rehab Potential Good    PT Frequency 2x / week    PT Duration 12 weeks    PT Treatment/Interventions ADLs/Self Care Home Management;Aquatic Therapy;Cryotherapy;Electrical Stimulation;Iontophoresis 4mg /ml Dexamethasone;Moist Heat;Ultrasound;Therapeutic activities;Therapeutic exercise;Neuromuscular re-education;Manual techniques;Dry needling;Taping    PT Next Visit Plan progress shoulder rehab per protocol.    PT Home Exercise Plan [id number]    Consulted and Agree with Plan of Care Patient           Patient will benefit from skilled therapeutic intervention in order to improve the following deficits and impairments:  Decreased range of motion, Increased muscle spasms, Impaired UE functional use, Decreased activity tolerance, Pain, Improper body mechanics, Decreased mobility, Decreased strength, Increased edema, Postural dysfunction, Impaired flexibility  Visit Diagnosis: Chronic right shoulder pain  Other symptoms and signs involving the musculoskeletal system  Abnormal posture  Weakness generalized     Problem List Patient Active Problem List   Diagnosis Date Noted  . Injury of right toe 03/09/2020  . Fracture of one rib, right side, subsequent encounter for fracture with routine healing 01/06/2020  . Cellulitis of leg 12/09/2018  . History of total hip arthroplasty, right 12/05/2018  . Avascular necrosis ([place]) 09/10/2018  . Acute right hip pain 08/15/2018  . 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 Chronic right shoulder pain 07/23/2018  . Morbid obesity ([place]) 12/25/2017  . Family history of autoimmune  disorder 12/18/2017  . Transaminitis 05/05/2017  . Lung nodule < 6cm on CT 05/05/2017  . Fever 04/27/2017  . PSVT (paroxysmal supraventricular tachycardia) ([place]) 03/13/2017  . Tobacco use 03/13/2017  . Palpitations 02/10/2017  . GAD (generalized anxiety disorder) 02/10/2017  . Hypertension 02/10/2017  . Abnormal thyroid blood test 02/10/2017   [name], PTA 09/16/20 2:30 PM  [place], [place], [address] Phone: [phone]   Fax:  [phone]  Name: KRISTIE BRACEWELL MRN: [id number] Date of Birth: 03/29/1976

## 2020-09-18 ENCOUNTER — Ambulatory Visit (INDEPENDENT_AMBULATORY_CARE_PROVIDER_SITE_OTHER): Payer: No Typology Code available for payment source | Admitting: Rehabilitative and Restorative Service Providers"

## 2020-09-18 ENCOUNTER — Encounter: Payer: Self-pay | Admitting: Rehabilitative and Restorative Service Providers"

## 2020-09-18 ENCOUNTER — Other Ambulatory Visit: Payer: Self-pay

## 2020-09-18 DIAGNOSIS — R293 Abnormal posture: Secondary | ICD-10-CM

## 2020-09-18 DIAGNOSIS — R29898 Other symptoms and signs involving the musculoskeletal system: Secondary | ICD-10-CM | POA: Diagnosis not present

## 2020-09-18 DIAGNOSIS — M25511 Pain in right shoulder: Secondary | ICD-10-CM | POA: Diagnosis not present

## 2020-09-18 DIAGNOSIS — R531 Weakness: Secondary | ICD-10-CM

## 2020-09-18 DIAGNOSIS — M25611 Stiffness of right shoulder, not elsewhere classified: Secondary | ICD-10-CM

## 2020-09-18 DIAGNOSIS — G8929 Other chronic pain: Secondary | ICD-10-CM

## 2020-09-18 NOTE — Therapy (Signed)
[place] [address] [place], [place], [address] Phone: [phone]   Fax:  [phone]  Physical Therapy Treatment  Patient Details  Name: Deborah Goodman MRN: [id number] Date of Birth: Sep 30, 1976 Referring Provider (PT): Dr Ophelia Charter    Encounter Date: 09/18/2020   PT End of Session - 09/18/20 1316    Visit Number 12    Number of Visits 24    Date for PT Re-Evaluation 10/29/20    PT Start Time 1314    PT Stop Time 1402    PT Time Calculation (min) 48 min    Activity Tolerance Patient tolerated treatment well           Past Medical History:  Diagnosis Date  . Acid reflux   . Avascular necrosis ([place]) 08/2018   Right hip  . Complication of anesthesia    needs glide scope because trachea sit to the side not in the middle after SI surgery did not have a voice  . Depression   . Fatty liver 04/2017   noted on CT Chest  . Fibromyalgia   . GAD (generalized anxiety disorder) 02/10/2017  . Hypertension 02/10/2017  . Lung nodule 04/2017   Stable 4 mm nodule in the periphery of the lateral segment , right, noted on CT Chest  . Palpitations 02/10/2017   a. 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   . Pneumonia 01/2018  . Raynaud's syndrome   . Splenomegaly 04/2017   noted on CT Chest  . SVT (supraventricular tachycardia) ([place])   . Systolic murmur   . Tendinopathy of rotator cuff    Right  . Tobacco use     Past Surgical History:  Procedure Laterality Date  . SACRO-ILIAC PINNING  2016   fusion, in [place]  . SHOULDER ARTHROSCOPY WITH OPEN ROTATOR CUFF REPAIR AND DISTAL CLAVICLE ACROMINECTOMY Right 07/15/2020   Procedure: SHOULDER ARTHROSCOPY WITH ROTATOR CUFF REPAIR AND DISTAL CLAVICLE [place] AND BICEPS TENDOESIS;  Surgeon: [name], MD;  Location: [facility];  Service: Orthopedics;  Laterality: Right;  . TONSILLECTOMY     age 44  . TOTAL HIP ARTHROPLASTY Right 12/05/2018    Procedure: TOTAL HIP ARTHROPLASTY ANTERIOR APPROACH;  Surgeon: Frederik Pear, MD;  Location: [facility];  Service: Orthopedics;  Laterality: Right;    There were no vitals filed for this visit.   Subjective Assessment - 09/18/20 1318    Subjective Doing well - just a little sore. Working on her exercises at home.    Currently in Pain? Yes    Pain Score 1     Pain Location Shoulder    Pain Orientation Right    Pain Descriptors / Indicators Sore    Pain Type Surgical pain;Chronic pain              [facility] PT Assessment - 09/18/20 0001      Assessment   Medical Diagnosis Rt shoulder RCR; biceps tendonesis; DCR; [place]; extensive debridement     Referring Provider (PT) Dr Ophelia Charter     Onset Date/Surgical Date 07/15/20    Hand Dominance Right    Next MD Visit 10/06/20    Prior Therapy yes for shoulder and hip       AROM   AROM Assessment Site --   assessed with pt standing    Right/Left Shoulder Right    Right Shoulder Extension 34 Degrees    Right Shoulder Flexion 150 Degrees  Right Shoulder ABduction 140 Degrees    Right Shoulder External Rotation 67 Degrees   scaption to ~ 70 deg    Left Shoulder Extension 42 Degrees    Left Shoulder Flexion 153 Degrees    Left Shoulder ABduction 154 Degrees    Left Shoulder External Rotation 90 Degrees      PROM   Right/Left Shoulder --   assessed with pt in supine    Right Shoulder Flexion 150 Degrees    Right Shoulder ABduction 145 Degrees   in scapular plane    Right Shoulder Internal Rotation 43 Degrees   in scapular plane    Right Shoulder External Rotation 65 Degrees   in scapular plane      Palpation   Palpation comment muscular tightness through the Rt shoulder girdle into pecs; upper trap; leveator; cervical musculature; biceps; deltoid                          [facility] Adult PT Treatment/Exercise - 09/18/20 0001      Shoulder Exercises: Standing   Internal Rotation AAROM;Both;10 reps   holding wand, sliding up  back with both hands 10 sec hold    Flexion AROM;Both;10 reps   to 90 deg    Extension AAROM;Both;10 reps   holding cane - 10 sec hold      Shoulder Exercises: Pulleys   Flexion --   10 reps 5 sec hold    Scaption --   10 reps 5 sec hold      Shoulder Exercises: Therapy Ball   Flexion Both;10 reps    Flexion Limitations rolling small ball up wall then bouncing ball on wall     Other Therapy Ball Exercises small ball on wall ~ chest height flex/ext; horiz ab/add; circles CW and CCW x 10 each - -     Other Therapy Ball Exercises seated with small ball elbow flex/ext; press up and back at chest height; arms extended supination and pronation; diagonal chops each direction; holding ball still at head height and looking around ball x 10 each       Shoulder Exercises: ROM/Strengthening   Rhythmic Stabilization, Supine 110, 90, and 50 deg flexion with Rt arm x 20 sec each       Shoulder Exercises: Isometric Strengthening   Flexion 5X10"    Extension 5X10"    External Rotation 5X10"    Internal Rotation 5X10"    ABduction 5X10"      Shoulder Exercises: Stretch   Internal Rotation Stretch 3 reps   standing with strap    Other Shoulder Stretches Rt bicep stretch x 20 sec x 3 reps      Vasopneumatic   Number Minutes Vasopneumatic  10 minutes    Vasopnuematic Location  Shoulder   Rt   Vasopneumatic Pressure Low    Vasopneumatic Temperature  34 deg       Manual Therapy   Manual therapy comments pt supine     Soft tissue mobilization Rt pec, ant shoulder; upper trap; leveator     Myofascial Release anterior chest/shoulder     Scapular Mobilization Rt shoulder in all planes    Passive ROM Rt shoulder into flex; scaption; and ER/IR in scapular plane, to tissue limits and no pain.                     PT Short Term Goals - 08/21/20 1640      PT SHORT  TERM GOAL #1   Title Independent in initial exercise program    Time 6    Period Weeks    Status Achieved    Target Date  09/17/20      PT SHORT TERM GOAL #2   Title improve posture and alignment with patient demonstrating good posterior shoulder girdle contraction/control    Time 6    Period Weeks    Status On-going    Target Date 09/17/20      PT SHORT TERM GOAL #3   Title PROM to 140 deg flexion; 40 deg ER at neurtal; 60-80 deg abd without rotation per protocol    Time 6    Period Weeks    Status Achieved    Target Date 09/17/20             PT Long Term Goals - 09/09/20 1251      PT LONG TERM GOAL #1   Title Improve posture and alignment with patient to demonstrate improved upright posture with posterior shoulder girdle engaged    Time 12    Period Weeks    Status On-going      PT LONG TERM GOAL #2   Title Increase AROM Rt shoulder to functional level with no pain    Time 12    Period Weeks    Status On-going      PT LONG TERM GOAL #3   Title 4+/5 to 5/5 shoulder strength    Baseline -    Time 12    Period Weeks    Status On-going      PT LONG TERM GOAL #4   Title Independent in HEP    Time 12    Period Weeks    Status On-going      PT LONG TERM GOAL #5   Title Improve FOTO to 28% limitation 02/07/2019    Time 12    Period Weeks    Status On-going                 Plan - 09/18/20 1319    Clinical Impression Statement Continues to progress with shoulder rehab as allowed by post op protocol. No resisted RC strengthening until 12 weeks per protocol. Patient returns to MD 10/06/20. FMLA runs out 10/07/20 and she plans to return to work.    Rehab Potential Good    PT Frequency 2x / week    PT Duration 12 weeks    PT Treatment/Interventions ADLs/Self Care Home Management;Aquatic Therapy;Cryotherapy;Electrical Stimulation;Iontophoresis 4mg /ml Dexamethasone;Moist Heat;Ultrasound;Therapeutic activities;Therapeutic exercise;Neuromuscular re-education;Manual techniques;Dry needling;Taping    PT Next Visit Plan progress shoulder rehab per protocol - note to Dr Griffin Basil to see if  we can progress with some gentle strengthening to prepare pt for RTW and try DN to address muscular tightness through the Rt shoulder girdle.    PT Home Exercise Plan [email or web address]    Consulted and Agree with Plan of Care Patient           Patient will benefit from skilled therapeutic intervention in order to improve the following deficits and impairments:     Visit Diagnosis: Chronic right shoulder pain  Other symptoms and signs involving the musculoskeletal system  Abnormal posture  Weakness generalized  Stiffness of joint, shoulder region, right     Problem List Patient Active Problem List   Diagnosis Date Noted  . Injury of right toe 03/09/2020  . Fracture of one rib, right side, subsequent encounter for fracture with routine healing 01/06/2020  . Cellulitis  of leg 12/09/2018  . History of total hip arthroplasty, right 12/05/2018  . Avascular necrosis ([place]) 09/10/2018  . Acute right hip pain 08/15/2018  . 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 Chronic right shoulder pain 07/23/2018  . Morbid obesity ([place]) 12/25/2017  . Family history of autoimmune disorder 12/18/2017  . Transaminitis 05/05/2017  . Lung nodule < 6cm on CT 05/05/2017  . Fever 04/27/2017  . PSVT (paroxysmal supraventricular tachycardia) ([place]) 03/13/2017  . Tobacco use 03/13/2017  . Palpitations 02/10/2017  . GAD (generalized anxiety disorder) 02/10/2017  . Hypertension 02/10/2017  . Abnormal thyroid blood test 02/10/2017    Deborah Goodman PT, MPH  09/18/2020, 1:58 PM  [facility] [place], [place], [address] Phone: [phone]   Fax:  [phone]  Name: Deborah Goodman MRN: [id number] Date of Birth: 24-Apr-1976

## 2020-09-21 ENCOUNTER — Encounter: Payer: No Typology Code available for payment source | Admitting: Rehabilitative and Restorative Service Providers"

## 2020-09-23 ENCOUNTER — Encounter: Payer: Self-pay | Admitting: Physical Therapy

## 2020-09-23 ENCOUNTER — Ambulatory Visit (INDEPENDENT_AMBULATORY_CARE_PROVIDER_SITE_OTHER): Payer: No Typology Code available for payment source | Admitting: Physical Therapy

## 2020-09-23 ENCOUNTER — Other Ambulatory Visit: Payer: Self-pay

## 2020-09-23 DIAGNOSIS — R531 Weakness: Secondary | ICD-10-CM

## 2020-09-23 DIAGNOSIS — G8929 Other chronic pain: Secondary | ICD-10-CM

## 2020-09-23 DIAGNOSIS — R29898 Other symptoms and signs involving the musculoskeletal system: Secondary | ICD-10-CM

## 2020-09-23 DIAGNOSIS — M25611 Stiffness of right shoulder, not elsewhere classified: Secondary | ICD-10-CM

## 2020-09-23 DIAGNOSIS — M25511 Pain in right shoulder: Secondary | ICD-10-CM

## 2020-09-23 DIAGNOSIS — R293 Abnormal posture: Secondary | ICD-10-CM

## 2020-09-23 NOTE — Therapy (Signed)
[place], [place], [address] Phone: [phone]   Fax:  [phone]  Physical Therapy Treatment  Patient Details  Name: Deborah Goodman MRN: [id number] Date of Birth: 1976-05-11 Referring Provider (PT): Dr Ophelia Charter    Encounter Date: 09/23/2020   PT End of Session - 09/23/20 1345    Visit Number 13    Number of Visits 24    Date for PT Re-Evaluation 10/29/20    PT Start Time 9417    PT Stop Time 1444    PT Time Calculation (min) 59 min    Activity Tolerance Patient tolerated treatment well    Behavior During Therapy [employer] for tasks assessed/performed           Past Medical History:  Diagnosis Date  . Acid reflux   . Avascular necrosis ([place]) 08/2018   Right hip  . Complication of anesthesia    needs glide scope because trachea sit to the side not in the middle after SI surgery did not have a voice  . Depression   . Fatty liver 04/2017   noted on CT Chest  . Fibromyalgia   . GAD (generalized anxiety disorder) 02/10/2017  . Hypertension 02/10/2017  . Lung nodule 04/2017   Stable 4 mm nodule in the periphery of the lateral segment , right, noted on CT Chest  . Palpitations 02/10/2017   a. 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   . Pneumonia 01/2018  . Raynaud's syndrome   . Splenomegaly 04/2017   noted on CT Chest  . SVT (supraventricular tachycardia) ([place])   . Systolic murmur   . Tendinopathy of rotator cuff    Right  . Tobacco use     Past Surgical History:  Procedure Laterality Date  . SACRO-ILIAC PINNING  2016   fusion, in [place]  . SHOULDER ARTHROSCOPY WITH OPEN ROTATOR CUFF REPAIR AND DISTAL CLAVICLE ACROMINECTOMY Right 07/15/2020   Procedure: SHOULDER ARTHROSCOPY WITH ROTATOR CUFF REPAIR AND DISTAL CLAVICLE [place] AND BICEPS TENDOESIS;  Surgeon: [name], MD;  Location: [facility];  Service: Orthopedics;  Laterality: Right;  . TONSILLECTOMY      age 44  . TOTAL HIP ARTHROPLASTY Right 12/05/2018   Procedure: TOTAL HIP ARTHROPLASTY ANTERIOR APPROACH;  Surgeon: Frederik Pear, MD;  Location: [facility];  Service: Orthopedics;  Laterality: Right;    There were no vitals filed for this visit.   Subjective Assessment - 09/23/20 1345    Subjective Pt reports she is doing well.  MD said we can do some DN on the shoulder and start some light band work per primary MD and therapist    Currently in Pain? Yes    Pain Score 1     Pain Location Shoulder    Pain Orientation Right    Pain Descriptors / Indicators Sore    Pain Type Surgical pain                             [facility] Adult PT Treatment/Exercise - 09/23/20 0001      Shoulder Exercises: Supine   Flexion Strengthening;Right   3x8, no wt- attempted 1# to heavy    Flexion Limitations 10 reps FWD punch 1#      Shoulder Exercises: Sidelying   External Rotation Strengthening;Right;Weights   8, stopped after on eset d/t fatigue and som epain   External Rotation Weight (lbs) 1  towel under elbow     Shoulder Exercises: Standing   Row Strengthening;Both;Theraband   3x8   Theraband Level (Shoulder Row) Level 1 (Yellow)   verbal cues for form     Shoulder Exercises: Pulleys   Flexion 3 minutes    Scaption 3 minutes    Other Pulley Exercises 3 minutes behind the back       Shoulder Exercises: Stretch   Other Shoulder Stretches Rt bicep stretch x 20 sec x 3 reps      Vasopneumatic   Number Minutes Vasopneumatic  10 minutes    Vasopnuematic Location  Shoulder   rt   Vasopneumatic Pressure Low    Vasopneumatic Temperature  34 deg       Manual Therapy   Manual therapy comments skilled palpation and monitoring during DN    Soft tissue mobilization STM to Rt shoulder musculature - pecs, deltoids and posterior shoulder             Trigger Point Dry Needling - 09/23/20 0001    Consent Given? Yes    Education Handout Provided Previously provided    Muscles  Treated Upper Quadrant Pectoralis major;Pectoralis minor;Infraspinatus;Subscapularis;Deltoid    Pectoralis Major Response Palpable increased muscle length;Twitch response elicited   rt   Pectoralis Minor Response Twitch response elicited;Palpable increased muscle length   rt   Infraspinatus Response Twitch response elicited;Palpable increased muscle length   rt   Subscapularis Response Twitch response elicited;Palpable increased muscle length   rt   Deltoid Response Twitch response elicited;Palpable increased muscle length   rt                 PT Short Term Goals - 08/21/20 1640      PT SHORT TERM GOAL #1   Title Independent in initial exercise program    Time 6    Period Weeks    Status Achieved    Target Date 09/17/20      PT SHORT TERM GOAL #2   Title improve posture and alignment with patient demonstrating good posterior shoulder girdle contraction/control    Time 6    Period Weeks    Status On-going    Target Date 09/17/20      PT SHORT TERM GOAL #3   Title PROM to 140 deg flexion; 40 deg ER at neurtal; 60-80 deg abd without rotation per protocol    Time 6    Period Weeks    Status Achieved    Target Date 09/17/20             PT Long Term Goals - 09/09/20 1251      PT LONG TERM GOAL #1   Title Improve posture and alignment with patient to demonstrate improved upright posture with posterior shoulder girdle engaged    Time 12    Period Weeks    Status On-going      PT LONG TERM GOAL #2   Title Increase AROM Rt shoulder to functional level with no pain    Time 12    Period Weeks    Status On-going      PT LONG TERM GOAL #3   Title 4+/5 to 5/5 shoulder strength    Baseline -    Time 12    Period Weeks    Status On-going      PT LONG TERM GOAL #4   Title Independent in HEP    Time 12    Period Weeks    Status On-going  PT LONG TERM GOAL #5   Title Improve FOTO to 28% limitation 02/07/2019    Time 12    Period Weeks    Status On-going                   Plan - 09/23/20 1433    Clinical Impression Statement Per primary PT, MD okd patient having some DN and to begin light strengthening.  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  She tolerated DN well with mulitple releases in her RTC muscles and pecs.  She states she has people at work that can help her once she returns to work as needed.    Rehab Potential Good    PT Frequency 2x / week    PT Duration 12 weeks    PT Treatment/Interventions ADLs/Self Care Home Management;Aquatic Therapy;Cryotherapy;Electrical Stimulation;Iontophoresis 4mg /ml Dexamethasone;Moist Heat;Ultrasound;Therapeutic activities;Therapeutic exercise;Neuromuscular re-education;Manual techniques;Dry needling;Taping    PT Next Visit Plan assess response to DN and initial strengtheing    PT Home Exercise Plan [email or web address]    Consulted and Agree with Plan of Care Patient           Patient will benefit from skilled therapeutic intervention in order to improve the following deficits and impairments:  Decreased range of motion, Increased muscle spasms, Impaired UE functional use, Decreased activity tolerance, Pain, Improper body mechanics, Decreased mobility, Decreased strength, Increased edema, Postural dysfunction, Impaired flexibility  Visit Diagnosis: Chronic right shoulder pain  Other symptoms and signs involving the musculoskeletal system  Abnormal posture  Weakness generalized  Stiffness of joint, shoulder region, right     Problem List Patient Active Problem List   Diagnosis Date Noted  . Injury of right toe 03/09/2020  . Fracture of one rib, right side, subsequent encounter for fracture with routine healing 01/06/2020  . Cellulitis of leg 12/09/2018  . History of total hip arthroplasty, right 12/05/2018  . Avascular necrosis ([place]) 09/10/2018  . Acute right hip pain 08/15/2018  . 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 Chronic right  shoulder pain 07/23/2018  . Morbid obesity ([place]) 12/25/2017  . Family history of autoimmune disorder 12/18/2017  . Transaminitis 05/05/2017  . Lung nodule < 6cm on CT 05/05/2017  . Fever 04/27/2017  . PSVT (paroxysmal supraventricular tachycardia) ([place]) 03/13/2017  . Tobacco use 03/13/2017  . Palpitations 02/10/2017  . GAD (generalized anxiety disorder) 02/10/2017  . Hypertension 02/10/2017  . Abnormal thyroid blood test 02/10/2017    Jeral Pinch PT  09/23/2020, 2:35 PM  [facility] [place], [place], [address] Phone: [phone]   Fax:  [phone]  Name: Deborah Goodman MRN: [id number] Date of Birth: [birth year]

## 2020-09-25 ENCOUNTER — Encounter: Payer: Self-pay | Admitting: Osteopathic Medicine

## 2020-09-25 ENCOUNTER — Ambulatory Visit (INDEPENDENT_AMBULATORY_CARE_PROVIDER_SITE_OTHER): Payer: No Typology Code available for payment source | Admitting: Rehabilitative and Restorative Service Providers"

## 2020-09-25 ENCOUNTER — Other Ambulatory Visit: Payer: Self-pay

## 2020-09-25 DIAGNOSIS — G8929 Other chronic pain: Secondary | ICD-10-CM

## 2020-09-25 DIAGNOSIS — M25611 Stiffness of right shoulder, not elsewhere classified: Secondary | ICD-10-CM

## 2020-09-25 DIAGNOSIS — M25511 Pain in right shoulder: Secondary | ICD-10-CM | POA: Diagnosis not present

## 2020-09-25 DIAGNOSIS — R21 Rash and other nonspecific skin eruption: Secondary | ICD-10-CM

## 2020-09-25 DIAGNOSIS — R293 Abnormal posture: Secondary | ICD-10-CM | POA: Diagnosis not present

## 2020-09-25 DIAGNOSIS — R29898 Other symptoms and signs involving the musculoskeletal system: Secondary | ICD-10-CM

## 2020-09-25 DIAGNOSIS — R531 Weakness: Secondary | ICD-10-CM

## 2020-09-25 NOTE — Telephone Encounter (Signed)
Dermatology referral pended.

## 2020-09-25 NOTE — Patient Instructions (Addendum)
Holding yellow theraband with elbow at side bent to 90 degrees Step to side holding arm still Hold forearm in neutral do not let it move toward body 10 reps 3 sec hold    Lying on back push water bottle to ceiling  Squeeze shoulder blades back Move hand up and down; side to side; circle CW/CCW  10-20 reps each direction  Place ball on wall at varying heights  Move arm  1-2 min   Access Code: [email or web address]: [email or web address]: 10/08/2021Prepared by: Kenzli Barritt HoltExercises  Circular Shoulder Pendulum with Table Support - 3-4 x daily - 7 x weekly - 1 sets - 20-30 reps  Seated Scapular Retraction - 2 x daily - 7 x weekly - 1-2 sets - 10 reps - 10 sec hold  Seated Cervical Sidebending AROM - 2 x daily - 7 x weekly - 1 sets - 5 reps - 5-10 sec hold  Seated Shoulder Flexion Towel Slide at Table Top Full Range of Motion - 2 x daily - 7 x weekly - 1 sets - 5 reps - 5-10 sec hold  Supine Shoulder Abduction AAROM with [place] - 2 x daily - 7 x weekly - 1 sets - 10 reps - 5 hold  Supine Shoulder External Rotation in 45 Degrees Abduction AAROM with [place]el - 2 x daily - 7 x weekly - 1 sets - 10 reps - 5 hold  Standing Shoulder Extension with [place] - 2 x daily - 7 x weekly - 1 sets - 10 reps - 5 hold  Standing Shoulder Internal Rotation AAROM with [place] - 2 x daily - 7 x weekly - 1 sets - 10 reps - 5 hold  Seated Shoulder Setting - 2 x daily - 7 x weekly - 1 sets - 10 reps - 5 hold  Standing Isometric Shoulder External Rotation with Doorway - 2 x daily - 7 x weekly - 1 sets - 10 reps - 5-10 sec hold  Standing Isometric Shoulder Internal Rotation with Towel Roll at Doorway - 2 x daily - 7 x weekly - 1 sets - 10 reps - 5-10 sec hold  Standing Isometric Shoulder Extension with Doorway - Arm Bent - 2 x daily - 7 x weekly - 1 sets - 10 reps - 5-10 sec hold  Standing Isometric Shoulder Abduction with Doorway - Arm Bent - 2 x daily - 7 x weekly - 1 sets - 10 reps - 5-10 sec hold  Standing  Isometric Shoulder Flexion with Doorway - Arm Bent - 2 x daily - 7 x weekly - 1 sets - 10 reps - 5-10 sec hold  Standing Bilateral Low Shoulder Row with Anchored Resistance - 2 x daily - 7 x weekly - 1-3 sets - 10 reps - 2-3 sec hold  Shoulder Extension with Resistance - 2 x daily - 7 x weekly - 1-3 sets - 10 reps - 2-3 sec hold  Sidelying Shoulder External Rotation Dumbbell - 2 x daily - 7 x weekly - 1 sets - 10 reps - 3 sec hold  Supine Single Arm Shoulder Protraction - 2 x daily - 7 x weekly - 1 sets - 10 reps - 2-3 sec hold

## 2020-09-25 NOTE — Therapy (Signed)
[place], [place], [address] Phone: [phone]   Fax:  [phone]  Physical Therapy Treatment  Patient Details  Name: Deborah Goodman MRN: [id number] Date of Birth: 12/17/1976 Referring Provider (PT): Dr Ophelia Charter    Encounter Date: 09/25/2020   PT End of Session - 09/25/20 1408    Visit Number 14    Number of Visits 24    Date for PT Re-Evaluation 10/29/20    PT Start Time 1400    PT Stop Time 1452    PT Time Calculation (min) 52 min    Activity Tolerance Patient tolerated treatment well           Past Medical History:  Diagnosis Date  . Acid reflux   . Avascular necrosis ([place]) 08/2018   Right hip  . Complication of anesthesia    needs glide scope because trachea sit to the side not in the middle after SI surgery did not have a voice  . Depression   . Fatty liver 04/2017   noted on CT Chest  . Fibromyalgia   . GAD (generalized anxiety disorder) 02/10/2017  . Hypertension 02/10/2017  . Lung nodule 04/2017   Stable 4 mm nodule in the periphery of the lateral segment , right, noted on CT Chest  . Palpitations 02/10/2017   a. 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   . Pneumonia 01/2018  . Raynaud's syndrome   . Splenomegaly 04/2017   noted on CT Chest  . SVT (supraventricular tachycardia) ([place])   . Systolic murmur   . Tendinopathy of rotator cuff    Right  . Tobacco use     Past Surgical History:  Procedure Laterality Date  . SACRO-ILIAC PINNING  2016   fusion, in [place]  . SHOULDER ARTHROSCOPY WITH OPEN ROTATOR CUFF REPAIR AND DISTAL CLAVICLE ACROMINECTOMY Right 07/15/2020   Procedure: SHOULDER ARTHROSCOPY WITH ROTATOR CUFF REPAIR AND DISTAL CLAVICLE [place] AND BICEPS TENDOESIS;  Surgeon: [name], MD;  Location: [facility];  Service: Orthopedics;  Laterality: Right;  . TONSILLECTOMY     age 44  . TOTAL HIP ARTHROPLASTY Right 12/05/2018    Procedure: TOTAL HIP ARTHROPLASTY ANTERIOR APPROACH;  Surgeon: Frederik Pear, MD;  Location: [facility];  Service: Orthopedics;  Laterality: Right;    There were no vitals filed for this visit.   Subjective Assessment - 09/25/20 1408    Subjective Patient reports that she had some soreness in the Rt shoulder after DN but no pain. Feels she will be able to do most of what she needs to do to RTW    Currently in Pain? Yes    Pain Score 1     Pain Location Shoulder    Pain Orientation Right    Pain Descriptors / Indicators Sore                             [facility] Adult PT Treatment/Exercise - 09/25/20 0001      Shoulder Exercises: Supine   Flexion Limitations 10 reps FWD punch 2#    Other Supine Exercises rhythmic stabilizatin 2# wt fwd/back; side to side; circles CW/CCW x 20 each       Shoulder Exercises: Sidelying   External Rotation Strengthening;Right;Weights   8, stopped after on eset d/t fatigue and som epain   External Rotation Weight (lbs) 2      Shoulder Exercises: Standing  External Rotation Strengthening;Right;10 reps    Theraband Level (Shoulder External Rotation) Level 1 (Yellow)    External Rotation Limitations hoding band still steping to side 3 sec hold     Internal Rotation Strengthening;Right;10 reps;Theraband    Theraband Level (Shoulder Internal Rotation) Level 1 (Yellow)    Internal Rotation Limitations holdig band still stepping to side 3 sec hold     ABduction Strengthening;Right;Left;20 reps;Weights   in scaption to 90 deg 10 x 2 sets    Shoulder ABduction Weight (lbs) 2    Extension Strengthening;Right;10 reps;Theraband    Theraband Level (Shoulder Extension) Level 2 (Red)    Row Strengthening;Both;10 reps;Theraband    Theraband Level (Shoulder Row) Level 1 (Yellow);Level 2 (Red)      Shoulder Exercises: Pulleys   Flexion 3 minutes    Scaption 3 minutes    Other Pulley Exercises IR x 5 reps       Shoulder Exercises: Stretch   Other  Shoulder Stretches prolonged snow angel 1-2 min x 2 reps       Vasopneumatic   Number Minutes Vasopneumatic  10 minutes    Vasopnuematic Location  Shoulder   rt   Vasopneumatic Pressure Medium    Vasopneumatic Temperature  34 deg       Manual Therapy   Manual therapy comments skilled palpation and monitoring during DN    Soft tissue mobilization through Rt shoulder girdle - pecs; upper trap; leveator; anterior deltoid; biceps             Trigger Point Dry Needling - 09/25/20 0001    Consent Given? Yes    Education Handout Provided Previously provided    Pectoralis Major Response Palpable increased muscle length;Twitch response elicited   rt   Pectoralis Minor Response Twitch response elicited;Palpable increased muscle length   rt   Deltoid Response Twitch response elicited;Palpable increased muscle length   rt               PT Education - 09/25/20 1439    Education Details HEP    Person(s) Educated Patient    Methods Explanation;Demonstration;Tactile cues;Verbal cues;Handout    Comprehension Verbalized understanding;Returned demonstration;Verbal cues required;Tactile cues required            PT Short Term Goals - 08/21/20 1640      PT SHORT TERM GOAL #1   Title Independent in initial exercise program    Time 6    Period Weeks    Status Achieved    Target Date 09/17/20      PT SHORT TERM GOAL #2   Title improve posture and alignment with patient demonstrating good posterior shoulder girdle contraction/control    Time 6    Period Weeks    Status On-going    Target Date 09/17/20      PT SHORT TERM GOAL #3   Title PROM to 140 deg flexion; 40 deg ER at neurtal; 60-80 deg abd without rotation per protocol    Time 6    Period Weeks    Status Achieved    Target Date 09/17/20             PT Long Term Goals - 09/09/20 1251      PT LONG TERM GOAL #1   Title Improve posture and alignment with patient to demonstrate improved upright posture with posterior  shoulder girdle engaged    Time 12    Period Weeks    Status On-going      PT LONG  TERM GOAL #2   Title Increase AROM Rt shoulder to functional level with no pain    Time 12    Period Weeks    Status On-going      PT LONG TERM GOAL #3   Title 4+/5 to 5/5 shoulder strength    Baseline -    Time 12    Period Weeks    Status On-going      PT LONG TERM GOAL #4   Title Independent in HEP    Time 12    Period Weeks    Status On-going      PT LONG TERM GOAL #5   Title Improve FOTO to 28% limitation 02/07/2019    Time 12    Period Weeks    Status On-going                 Plan - 09/25/20 1414    Clinical Impression Statement Patient responded well to DN/manual work as well as strengthening at last visit. Added strengthening exercises with wts and TB for home without difficulty.    Rehab Potential Good    PT Frequency 2x / week    PT Duration 12 weeks    PT Treatment/Interventions ADLs/Self Care Home Management;Aquatic Therapy;Cryotherapy;Electrical Stimulation;Iontophoresis 4mg /ml Dexamethasone;Moist Heat;Ultrasound;Therapeutic activities;Therapeutic exercise;Neuromuscular re-education;Manual techniques;Dry needling;Taping    PT Next Visit Plan continue DN as indicated; strengthening - progressing gradually    PT Home Exercise Plan [id number]    Consulted and Agree with Plan of Care Patient           Patient will benefit from skilled therapeutic intervention in order to improve the following deficits and impairments:     Visit Diagnosis: Chronic right shoulder pain  Other symptoms and signs involving the musculoskeletal system  Abnormal posture  Weakness generalized  Stiffness of joint, shoulder region, right     Problem List Patient Active Problem List   Diagnosis Date Noted  . Injury of right toe 03/09/2020  . Fracture of one rib, right side, subsequent encounter for fracture with routine healing 01/06/2020  . Cellulitis of leg 12/09/2018  . History of  total hip arthroplasty, right 12/05/2018  . Avascular necrosis ([place]) 09/10/2018  . Acute right hip pain 08/15/2018  . 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 Chronic right shoulder pain 07/23/2018  . Morbid obesity ([place]) 12/25/2017  . Family history of autoimmune disorder 12/18/2017  . Transaminitis 05/05/2017  . Lung nodule < 6cm on CT 05/05/2017  . Fever 04/27/2017  . PSVT (paroxysmal supraventricular tachycardia) ([place]) 03/13/2017  . Tobacco use 03/13/2017  . Palpitations 02/10/2017  . GAD (generalized anxiety disorder) 02/10/2017  . Hypertension 02/10/2017  . Abnormal thyroid blood test 02/10/2017    Ted Goodner Nilda Simmer PT, MPH 09/25/2020, 3:10 PM  [facility] [place], [place], [address] Phone: [phone]   Fax:  [phone]  Name: Deborah Goodman MRN: [id number] Date of Birth: 11/27/1976

## 2020-10-01 ENCOUNTER — Encounter: Payer: No Typology Code available for payment source | Admitting: Physical Therapy

## 2020-10-05 ENCOUNTER — Ambulatory Visit (INDEPENDENT_AMBULATORY_CARE_PROVIDER_SITE_OTHER): Payer: No Typology Code available for payment source | Admitting: Physical Therapy

## 2020-10-05 ENCOUNTER — Encounter: Payer: Self-pay | Admitting: Physical Therapy

## 2020-10-05 ENCOUNTER — Other Ambulatory Visit: Payer: Self-pay

## 2020-10-05 ENCOUNTER — Ambulatory Visit (INDEPENDENT_AMBULATORY_CARE_PROVIDER_SITE_OTHER): Payer: No Typology Code available for payment source | Admitting: Osteopathic Medicine

## 2020-10-05 DIAGNOSIS — Z23 Encounter for immunization: Secondary | ICD-10-CM | POA: Diagnosis not present

## 2020-10-05 DIAGNOSIS — R29898 Other symptoms and signs involving the musculoskeletal system: Secondary | ICD-10-CM | POA: Diagnosis not present

## 2020-10-05 DIAGNOSIS — M25511 Pain in right shoulder: Secondary | ICD-10-CM

## 2020-10-05 DIAGNOSIS — G8929 Other chronic pain: Secondary | ICD-10-CM

## 2020-10-05 DIAGNOSIS — R293 Abnormal posture: Secondary | ICD-10-CM | POA: Diagnosis not present

## 2020-10-05 DIAGNOSIS — R531 Weakness: Secondary | ICD-10-CM | POA: Diagnosis not present

## 2020-10-05 NOTE — Patient Instructions (Signed)
Access Code: [email or web address]: [email or web address]: 10/18/2021Prepared by: [facility] KernersvilleExercises  Standing Bilateral Low Shoulder Row with Anchored Resistance - 2 x daily - 7 x weekly - 1-3 sets - 10 reps - 2-3 sec hold  Shoulder Extension with Resistance - 2 x daily - 7 x weekly - 1-3 sets - 10 reps - 2-3 sec hold  Supine Single Arm Shoulder Protraction - 2 x daily - 7 x weekly - 1 sets - 10 reps - 2-3 sec hold  External Rotation Reactive Isometrics with Flex Bar - 1 x daily - 7 x weekly - 2 sets - 10 reps  Shoulder Internal Rotation Reactive Isometrics - 1 x daily - 7 x weekly - 2 sets - 10 reps  Doorway Pec Stretch at 120 Elevation with Arm Straight - 2 x daily - 7 x weekly - 1 sets - 2 reps - 15-30 hold  Doorway Pec Stretch at 90 Degrees Abduction - 2 x daily - 7 x weekly - 1 sets - 2 reps - 15-30 hold  Standing Bicep Stretch at Wall - 2 x daily - 7 x weekly - 1 sets - 2 reps - 15-30 hold

## 2020-10-05 NOTE — Therapy (Signed)
[place], [place], [address] Phone: [phone]   Fax:  [phone]  Physical Therapy Treatment  Patient Details  Name: Deborah Goodman MRN: [id number] Date of Birth: 1976/03/11 Referring Provider (PT): Dr Ophelia Charter    Encounter Date: 10/05/2020   PT End of Session - 10/05/20 1350    Visit Number 15    Number of Visits 24    Date for PT Re-Evaluation 10/29/20    PT Start Time 1347    PT Stop Time 1428    PT Time Calculation (min) 41 min    Activity Tolerance Patient tolerated treatment well    Behavior During Therapy [facility] for tasks assessed/performed           Past Medical History:  Diagnosis Date  . Acid reflux   . Avascular necrosis ([place]) 08/2018   Right hip  . Complication of anesthesia    needs glide scope because trachea sit to the side not in the middle after SI surgery did not have a voice  . Depression   . Fatty liver 04/2017   noted on CT Chest  . Fibromyalgia   . GAD (generalized anxiety disorder) 02/10/2017  . Hypertension 02/10/2017  . Lung nodule 04/2017   Stable 4 mm nodule in the periphery of the lateral segment , right, noted on CT Chest  . Palpitations 02/10/2017   a. 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   . Pneumonia 01/2018  . Raynaud's syndrome   . Splenomegaly 04/2017   noted on CT Chest  . SVT (supraventricular tachycardia) ([place])   . Systolic murmur   . Tendinopathy of rotator cuff    Right  . Tobacco use     Past Surgical History:  Procedure Laterality Date  . SACRO-ILIAC PINNING  2016   fusion, in [place]  . SHOULDER ARTHROSCOPY WITH OPEN ROTATOR CUFF REPAIR AND DISTAL CLAVICLE ACROMINECTOMY Right 07/15/2020   Procedure: SHOULDER ARTHROSCOPY WITH ROTATOR CUFF REPAIR AND DISTAL CLAVICLE [place] AND BICEPS TENDOESIS;  Surgeon: [name], MD;  Location: [facility];  Service: Orthopedics;  Laterality: Right;  .  TONSILLECTOMY     age 44  . TOTAL HIP ARTHROPLASTY Right 12/05/2018   Procedure: TOTAL HIP ARTHROPLASTY ANTERIOR APPROACH;  Surgeon: Frederik Pear, MD;  Location: [facility];  Service: Orthopedics;  Laterality: Right;    There were no vitals filed for this visit.   Subjective Assessment - 10/05/20 1351    Subjective Pt report she is able to sleep on her Rt shoulder with less discomfort.  She feels she is ready to go back to work.    Currently in Pain? Yes    Pain Score 1     Pain Location Shoulder    Pain Orientation Right    Pain Descriptors / Indicators Sore    Aggravating Factors  ?    Pain Relieving Factors ice, medication              [facility] PT Assessment - 10/05/20 0001      Assessment   Medical Diagnosis Rt shoulder RCR; biceps tendonesis; DCR; SAC; extensive debridement     Referring Provider (PT) Dr Ophelia Charter     Onset Date/Surgical Date 07/15/20    Hand Dominance Right    Next MD Visit 10/06/20    Prior Therapy yes for shoulder and hip       Observation/Other Assessments   Focus on Therapeutic Outcomes (FOTO)  33% limitation,  28% limitation is goal.       AROM   Right Shoulder Extension 38 Degrees    Right Shoulder Flexion 153 Degrees    Right Shoulder ABduction 158 Degrees    Right Shoulder External Rotation 79 Degrees   scaption to ~ 70 deg      Strength   Strength Assessment Site Shoulder    Right/Left Shoulder Right    Right Shoulder Flexion 4/5    Right Shoulder Extension 5/5    Right Shoulder ABduction 3+/5    Right Shoulder Internal Rotation 4+/5    Right Shoulder External Rotation 4/5             [facility] Adult PT Treatment/Exercise - 10/05/20 0001      Shoulder Exercises: Supine   Protraction Strengthening;Right;10 reps    Protraction Weight (lbs) 2    Other Supine Exercises rhythmic stabilizatin 2# wt fwd/back; side to side; circles CW/CCW x 20 each       Shoulder Exercises: Standing   External Rotation Right;10 reps;Theraband   reactive  isometric x 3 sec hold    Theraband Level (Shoulder External Rotation) Level 2 (Red)    Internal Rotation Strengthening;Right;10 reps;Theraband   reactive isometric, 3 sec hold    Theraband Level (Shoulder Internal Rotation) Level 2 (Red)    Flexion Right;10 reps;Strengthening   forward punch   Theraband Level (Shoulder Flexion) Level 2 (Red)    ABduction Right;10 reps   to 90 deg    Shoulder ABduction Weight (lbs) 2    Extension Strengthening;Both;10 reps    Theraband Level (Shoulder Extension) Level 2 (Red)    Row Both;15 reps    Theraband Level (Shoulder Row) Level 2 (Red)      Shoulder Exercises: Pulleys   Flexion 2 minutes    Scaption 2 minutes          Shoulder Exercises: Stretch   Doorway Stretch Low, middle, high bilat x 20 sec each    Other Shoulder Stretches Rt bicep stretch x 20 sec x 3 reps                  PT Education - 10/05/20 1432    Education Details HEP - modified    Person(s) Educated Patient    Methods Explanation;Demonstration;Tactile cues;Handout;Verbal cues    Comprehension Verbalized understanding;Returned demonstration            PT Short Term Goals - 10/05/20 1536      PT SHORT TERM GOAL #1   Title Independent in initial exercise program    Time 6    Period Weeks    Status Achieved    Target Date 09/17/20      PT SHORT TERM GOAL #2   Title improve posture and alignment with patient demonstrating good posterior shoulder girdle contraction/control    Time 6    Period Weeks    Status Achieved    Target Date 09/17/20      PT SHORT TERM GOAL #3   Title PROM to 140 deg flexion; 40 deg ER at neurtal; 60-80 deg abd without rotation per protocol    Time 6    Period Weeks    Status Achieved    Target Date 09/17/20             PT Long Term Goals - 10/05/20 1405      PT LONG TERM GOAL #1   Title Improve posture and alignment with patient to demonstrate improved upright  posture with posterior shoulder girdle engaged    Time 12      Period Weeks    Status On-going      PT LONG TERM GOAL #2   Title Increase AROM Rt shoulder to functional level with no pain    Time 12    Period Weeks    Status Achieved      PT LONG TERM GOAL #3   Title 4+/5 to 5/5 shoulder strength    Baseline -    Time 12    Period Weeks    Status Partially Met      PT LONG TERM GOAL #4   Title Independent in HEP    Time 12    Period Weeks    Status On-going      PT LONG TERM GOAL #5   Title Improve FOTO to 28% limitation 02/07/2019    Time 12    Period Weeks    Status On-going                 Plan - 10/05/20 1534    Clinical Impression Statement Pt tolerating light resistance exercises for Rt shoulder well, and without pain.  Her Rt shoulder ROM is WFL and no pain; has met LTG#2.  FOTO score has improved, but not at goal yet. Pt making good gains each visit.    Rehab Potential Good    PT Frequency 2x / week    PT Duration 12 weeks    PT Treatment/Interventions ADLs/Self Care Home Management;Aquatic Therapy;Cryotherapy;Electrical Stimulation;Iontophoresis 46m/ml Dexamethasone;Moist Heat;Ultrasound;Therapeutic activities;Therapeutic exercise;Neuromuscular re-education;Manual techniques;Dry needling;Taping    PT Next Visit Plan continue DN as indicated; strengthening - progressing gradually    PT Home Exercise Plan [email or web address]    Consulted and Agree with Plan of Care Patient           Patient will benefit from skilled therapeutic intervention in order to improve the following deficits and impairments:  Decreased range of motion, Increased muscle spasms, Impaired UE functional use, Decreased activity tolerance, Pain, Improper body mechanics, Decreased mobility, Decreased strength, Increased edema, Postural dysfunction, Impaired flexibility  Visit Diagnosis: Chronic right shoulder pain  Other symptoms and signs involving the musculoskeletal system  Abnormal posture  Weakness generalized     Problem List Patient Active  Problem List   Diagnosis Date Noted  . Injury of right toe 03/09/2020  . Fracture of one rib, right side, subsequent encounter for fracture with routine healing 01/06/2020  . Cellulitis of leg 12/09/2018  . History of total hip arthroplasty, right 12/05/2018  . Avascular necrosis ([place] 09/10/2018  . Acute right hip pain 08/15/2018  . 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 Chronic right shoulder pain 07/23/2018  . Morbid obesity ([place] 12/25/2017  . Family history of autoimmune disorder 12/18/2017  . Transaminitis 05/05/2017  . Lung nodule < 6cm on CT 05/05/2017  . Fever 04/27/2017  . PSVT (paroxysmal [place]ventricular tachycardia) ([place] 03/13/2017  . Tobacco use 03/13/2017  . Palpitations 02/10/2017  . GAD (generalized anxiety disorder) 02/10/2017  . Hypertension 02/10/2017  . Abnormal thyroid blood test 02/10/2017   JKerin Goodman PTA 10/05/20 3:38 PM  [place] [address]Phone: [phone]  Fax:  [phone] Name: Deborah LUKINSMRN: [id number] of Birth: [birth year]

## 2020-10-06 ENCOUNTER — Emergency Department (HOSPITAL_COMMUNITY)
Admission: EM | Admit: 2020-10-06 | Discharge: 2020-10-07 | Disposition: A | Discharge status: Home / Self Care | Payer: No Typology Code available for payment source | Attending: Emergency Medicine | Admitting: Emergency Medicine

## 2020-10-06 ENCOUNTER — Encounter (HOSPITAL_COMMUNITY): Payer: Self-pay | Admitting: Emergency Medicine

## 2020-10-06 ENCOUNTER — Other Ambulatory Visit: Payer: Self-pay

## 2020-10-06 DIAGNOSIS — Z79899 Other long term (current) drug therapy: Secondary | ICD-10-CM | POA: Insufficient documentation

## 2020-10-06 DIAGNOSIS — F17210 Nicotine dependence, cigarettes, uncomplicated: Secondary | ICD-10-CM | POA: Insufficient documentation

## 2020-10-06 DIAGNOSIS — F17290 Nicotine dependence, other tobacco product, uncomplicated: Secondary | ICD-10-CM | POA: Insufficient documentation

## 2020-10-06 DIAGNOSIS — X501XXA Overexertion from prolonged static or awkward postures, initial encounter: Secondary | ICD-10-CM | POA: Diagnosis not present

## 2020-10-06 DIAGNOSIS — I10 Essential (primary) hypertension: Secondary | ICD-10-CM | POA: Diagnosis not present

## 2020-10-06 DIAGNOSIS — Z96641 Presence of right artificial hip joint: Secondary | ICD-10-CM | POA: Diagnosis not present

## 2020-10-06 DIAGNOSIS — S92355A Nondisplaced fracture of fifth metatarsal bone, left foot, initial encounter for closed fracture: Secondary | ICD-10-CM | POA: Diagnosis not present

## 2020-10-06 DIAGNOSIS — S99912A Unspecified injury of left ankle, initial encounter: Secondary | ICD-10-CM | POA: Diagnosis present

## 2020-10-06 DIAGNOSIS — S92356A Nondisplaced fracture of fifth metatarsal bone, unspecified foot, initial encounter for closed fracture: Secondary | ICD-10-CM

## 2020-10-06 NOTE — Telephone Encounter (Signed)
From the report it does not sound like she is going to need surgery on her foot, just have her schedule an appointment and we can provide a boot after an initial evaluation.  I have plenty of appointments this week.

## 2020-10-06 NOTE — ED Triage Notes (Signed)
Pt reports she felt her left ankle pop twice.  She has an xray which indicated it was fractured.  There is slight swelling to the foot/ankle.  Good pulses.

## 2020-10-07 ENCOUNTER — Ambulatory Visit: Payer: No Typology Code available for payment source | Admitting: Sports Medicine

## 2020-10-07 ENCOUNTER — Encounter: Payer: No Typology Code available for payment source | Admitting: Rehabilitative and Restorative Service Providers"

## 2020-10-07 ENCOUNTER — Emergency Department (HOSPITAL_COMMUNITY): Payer: No Typology Code available for payment source

## 2020-10-07 MED ORDER — IBUPROFEN 800 MG PO TABS
800.0000 mg | ORAL_TABLET | Freq: Once | ORAL | Status: AC
  Administered 2020-10-07: 800 mg via ORAL
  Filled 2020-10-07: qty 1

## 2020-10-07 NOTE — Telephone Encounter (Signed)
Appointment cancelled

## 2020-10-07 NOTE — ED Provider Notes (Signed)
[facility] EMERGENCY DEPARTMENT Provider Note   CSN: [id number] Arrival date & time: 10/06/20  2312     History Chief Complaint  Patient presents with  . Ankle Pain    Deborah Goodman is a 44 y.o. female.  Patient presents to the emergency department with a chief complaint of left ankle pain.  She states that 2 nights ago, she stood up and rolled her ankle while getting out of bed.  She felt 2 pops.  She went to [facility] emergency department and had x-rays done, but left prior to being seen by the provider.  She presents tonight with persistent pain and is requesting a boot.  She has been ambulating on the injured foot today.  She also reports recent right rotator cuff repair.  The history is provided by the patient. No language interpreter was used.       Past Medical History:  Diagnosis Date  . Acid reflux   . Avascular necrosis ([place]) 08/2018   Right hip  . Complication of anesthesia    needs glide scope because trachea sit to the side not in the middle after SI surgery did not have a voice  . Depression   . Fatty liver 04/2017   noted on CT Chest  . Fibromyalgia   . GAD (generalized anxiety disorder) 02/10/2017  . Hypertension 02/10/2017  . Lung nodule 04/2017   Stable 4 mm nodule in the periphery of the lateral segment , right, noted on CT Chest  . Palpitations 02/10/2017   a. 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   . Pneumonia 01/2018  . Raynaud's syndrome   . Splenomegaly 04/2017   noted on CT Chest  . SVT (supraventricular tachycardia) ([place])   . Systolic murmur   . Tendinopathy of rotator cuff    Right  . Tobacco use     Patient Active Problem List   Diagnosis Date Noted  . Injury of right toe 03/09/2020  . Fracture of one rib, right side, subsequent encounter for fracture with routine healing 01/06/2020  . Cellulitis of leg 12/09/2018  . History of total hip arthroplasty, right 12/05/2018  . Avascular necrosis  ([place]) 09/10/2018  . Acute right hip pain 08/15/2018  . 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 Chronic right shoulder pain 07/23/2018  . Morbid obesity ([place]) 12/25/2017  . Family history of autoimmune disorder 12/18/2017  . Transaminitis 05/05/2017  . Lung nodule < 6cm on CT 05/05/2017  . Fever 04/27/2017  . PSVT (paroxysmal supraventricular tachycardia) ([place]) 03/13/2017  . Tobacco use 03/13/2017  . Palpitations 02/10/2017  . GAD (generalized anxiety disorder) 02/10/2017  . Hypertension 02/10/2017  . Abnormal thyroid blood test 02/10/2017    Past Surgical History:  Procedure Laterality Date  . SACRO-ILIAC PINNING  2016   fusion, in [place]  . SHOULDER ARTHROSCOPY WITH OPEN ROTATOR CUFF REPAIR AND DISTAL CLAVICLE ACROMINECTOMY Right 07/15/2020   Procedure: SHOULDER ARTHROSCOPY WITH ROTATOR CUFF REPAIR AND DISTAL CLAVICLE [place] AND BICEPS TENDOESIS;  Surgeon: [name], MD;  Location: [facility];  Service: Orthopedics;  Laterality: Right;  . TONSILLECTOMY     age 30  . TOTAL HIP ARTHROPLASTY Right 12/05/2018   Procedure: TOTAL HIP ARTHROPLASTY ANTERIOR APPROACH;  Surgeon: Frederik Pear, MD;  Location: [facility];  Service: Orthopedics;  Laterality: Right;     OB History    Gravida  0   Para  0   Term  0  Preterm  0   AB  0   Living  0     SAB  0   TAB  0   Ectopic  0   Multiple  0   Live Births  0           Family History  Problem Relation Age of Onset  . Depression Mother   . Hypertension Mother   . Cancer Mother        colon  . Diabetes Father   . Arrhythmia Sister        palpitations  . Cancer Maternal Grandfather        COLON  . Diabetes Paternal Grandfather     Social History   Tobacco Use  . Smoking status: Current Every Day Smoker    Packs/day: 0.50    Years: 22.00    Pack years: 11.00    Types: Cigarettes, E-cigarettes  . Smokeless tobacco: Never Used  Vaping Use  . Vaping Use: Former  Substance Use Topics  .  Alcohol use: Yes    Alcohol/week: 2.0 standard drinks    Types: 1 Glasses of wine, 1 Cans of beer per week    Comment: 3/week  . Drug use: No    Home Medications Prior to Admission medications   Medication Sig Start Date End Date Taking? Authorizing Provider  alendronate (FOSAMAX) 70 MG tablet TAKE 1 TABLET BY MOUTH ONCE EVERY WEEK Patient taking differently: Take 70 mg by mouth once a week.  12/03/19   Emeterio Reeve, DO  amoxicillin-clavulanate (AUGMENTIN) 875-125 MG tablet Take 1 tablet by mouth 2 (two) times daily. 04/06/20   Orma Render, NP  ciprofloxacin-dexamethasone (CIPRODEX) OTIC suspension Place 4 drops into the left ear 2 (two) times daily. Use for 7 days. 04/06/20   Orma Render, NP  cyclobenzaprine (FLEXERIL) 10 MG tablet Take 5-10 mg by mouth 3 (three) times daily as needed for muscle spasms.    [provider]  diclofenac Sodium (VOLTAREN) 1 % GEL Apply 2 g topically 2 (two) times daily as needed (joint pain).    [provider]  metoprolol succinate (TOPROL-XL) 50 MG 24 hr tablet TAKE 1 TABLET BY MOUTH ONCE A DAY WITH A MEAL OR IMMEDIATELY AFTER. Patient taking differently: Take 50 mg by mouth daily.  12/25/19   Emeterio Reeve, DO  Multiple Vitamins-Minerals (MULTIVITAMIN GUMMIES ADULT PO) Take 2 tablets by mouth daily.    [provider]  Omeprazole 20 MG TBEC Take 20 mg by mouth daily as needed (heartburn).     [provider]  sertraline (ZOLOFT) 50 MG tablet Take 1 tablet (50 mg total) by mouth daily. 08/31/20   Emeterio Reeve, DO  Vitamin D, Ergocalciferol, (DRISDOL) 1.25 MG (50000 UNIT) CAPS capsule Take 1 capsule (50,000 Units total) by mouth every 7 (seven) days. For 12 weeks 08/31/20   Emeterio Reeve, DO    Allergies    Fish allergy, Shellfish allergy, and Sulfa antibiotics  Review of Systems   Review of Systems  All other systems reviewed and are negative.   Physical Exam Updated Vital Signs BP (!) 162/110  (BP Location: Right Arm)   Pulse (!) 101   Temp 99.3 F (37.4 C) (Oral)   Resp 20   Ht 5\' 6"  (1.676 m)   Wt 89.8 kg   SpO2 98%   BMI 31.95 kg/m   Physical Exam Nursing note and vitals reviewed.  Constitutional: Pt appears well-developed and well-nourished. No distress.  HENT:  Head: Normocephalic and atraumatic.  Eyes: Conjunctivae are normal.  Neck: Normal range of motion.  Cardiovascular: Normal rate, regular rhythm. Intact distal pulses.   Capillary refill < 3 sec.  Pulmonary/Chest: Effort normal and breath sounds normal.  Musculoskeletal:  Left ankle TTP over the lateral aspect Left foot TTP near the base of the 5th metatarsal Moderate swelling is present   ROM: limited by pain  Strength: limited by pain  Neurological: Pt  is alert. Coordination normal.  Sensation: 5/5 Skin: Skin is warm and dry. Pt is not diaphoretic.  No evidence of open wound or skin tenting Psychiatric: Pt has a normal mood and affect.    ED Results / Procedures / Treatments   Labs (all labs ordered are listed, but only abnormal results are displayed) Labs Reviewed - No data to display  EKG None  Radiology DG Ankle Complete Left  Result Date: 10/07/2020 CLINICAL DATA:  Left ankle pain. Patient felt left ankle pop twice. An x-ray yesterday indicated it was fractured. Lateral side swelling. EXAM: LEFT ANKLE COMPLETE - 3+ VIEW COMPARISON:  None. FINDINGS: Lateral soft tissue swelling about the left ankle. There is an oblique mildly displaced fracture of the base of the fifth metatarsal bone with extension to the articular surface. Small avulsion fragment off of the distal calcaneus. Ankle mortise appears intact. Talar dome appears intact. No destructive or expansile bone lesions. IMPRESSION: Oblique mildly displaced fracture of the base of the fifth metatarsal bone with extension to the articular surface. Small avulsion fragment off of the distal calcaneus. Electronically Signed   By: Lucienne Capers M.D.   On: 10/07/2020 01:56    Procedures Procedures (including critical care time)  Medications Ordered in ED Medications - No data to display  ED Course  I have reviewed the triage vital signs and the nursing notes.  Pertinent labs & imaging results that were available during my care of the patient were reviewed by me and considered in my medical decision making (see chart for details).    MDM Rules/Calculators/A&P                          Patient with fall 2 days ago.  She rolled her ankle.  She sustained a fracture to the base of the fifth metatarsal with extension to the articular surface and a small avulsion fragment off the distal calcaneus.  She is also recovering from right rotator cuff surgery.  I discussed case with Dr. Noemi Chapel, who recommends left-sided single crutch and cam walker.  Patient to follow-up with Dr. Griffin Basil on Thursday of this week. Final Clinical Impression(s) / ED Diagnoses Final diagnoses:  Closed nondisplaced fracture of fifth metatarsal bone, unspecified laterality, initial encounter    Rx / DC Orders ED Discharge Orders    None       Montine Circle, PA-C 10/07/20 3159    Palumbo, April, MD 10/07/20 0410

## 2020-10-12 ENCOUNTER — Encounter: Payer: No Typology Code available for payment source | Admitting: Rehabilitative and Restorative Service Providers"

## 2020-10-19 ENCOUNTER — Encounter: Payer: No Typology Code available for payment source | Admitting: Physical Therapy

## 2020-10-21 ENCOUNTER — Encounter: Payer: No Typology Code available for payment source | Admitting: Physical Therapy

## 2020-10-28 ENCOUNTER — Other Ambulatory Visit: Payer: Self-pay

## 2020-10-28 ENCOUNTER — Encounter: Payer: Self-pay | Admitting: Physical Therapy

## 2020-10-28 ENCOUNTER — Ambulatory Visit (INDEPENDENT_AMBULATORY_CARE_PROVIDER_SITE_OTHER): Payer: No Typology Code available for payment source | Admitting: Physical Therapy

## 2020-10-28 DIAGNOSIS — R29898 Other symptoms and signs involving the musculoskeletal system: Secondary | ICD-10-CM | POA: Diagnosis not present

## 2020-10-28 DIAGNOSIS — R531 Weakness: Secondary | ICD-10-CM | POA: Diagnosis not present

## 2020-10-28 DIAGNOSIS — R293 Abnormal posture: Secondary | ICD-10-CM

## 2020-10-28 DIAGNOSIS — M25511 Pain in right shoulder: Secondary | ICD-10-CM

## 2020-10-28 DIAGNOSIS — G8929 Other chronic pain: Secondary | ICD-10-CM

## 2020-10-28 NOTE — Patient Instructions (Signed)
Access Code: [email or web address]: [email or web address]: 11/10/2021Prepared by: [facility]erc[facility]  Doorway Pec Stretch at 120 Elevation with Arm Straight - 2 x daily - 7 x weekly - 1 sets - 2 reps - 15-30 hold  Doorway Pec Stretch at 90 Degrees Abduction - 2 x daily - 7 x weekly - 1 sets - 2 reps - 15-30 hold  Standing Bicep Stretch at [place] - 2 x daily - 7 x weekly - 1 sets - 2 reps - 15-30 hold  Seated Single Arm Shoulder Flexion with Dumbbells - 1 x daily - 7 x weekly - 2-3 sets - 10 reps  Seated Single Arm Shoulder Scaption with Dumbbell - 1 x daily - 7 x weekly - 2-3 sets - 10 reps  Seated Bilateral Shoulder External Rotation with Resistance - 1 x daily - 7 x weekly - 2-3 sets - 10 reps  Seated Shoulder Internal Rotation with Anchored Resistance - 1 x daily - 7 x weekly - 2-3 sets - 10 reps  Standing Bilateral Low Shoulder Row with Anchored Resistance - 1 x daily - 7 x weekly - 1-3 sets - 10 reps - 2-3 sec hold  Seated Single Arm Bicep Curls Supinated with Dumbbell - 1 x daily - 7 x weekly - 2 sets - 10 reps

## 2020-10-28 NOTE — Therapy (Signed)
[facility] [place], [place], [address] Phone: [phone]   Fax:  [phone]  Physical Therapy Treatment  Patient Details  Name: Deborah Goodman MRN: [id number] Date of Birth: 1976-03-10 Referring Provider (PT): Dr Ophelia Charter    Encounter Date: 10/28/2020   PT End of Session - 10/28/20 0813    Visit Number 16    Number of Visits 28    Date for PT Re-Evaluation 12/09/20    PT Start Time 0806    PT Stop Time 0858    PT Time Calculation (min) 52 min    Activity Tolerance Patient tolerated treatment well;No increased pain    Behavior During Therapy WFL for tasks assessed/performed           Past Medical History:  Diagnosis Date  . Acid reflux   . Avascular necrosis ([place]) 08/2018   Right hip  . Complication of anesthesia    needs glide scope because trachea sit to the side not in the middle after SI surgery did not have a voice  . Depression   . Fatty liver 04/2017   noted on CT Chest  . Fibromyalgia   . GAD (generalized anxiety disorder) 02/10/2017  . Hypertension 02/10/2017  . Lung nodule 04/2017   Stable 4 mm nodule in the periphery of the lateral segment , right, noted on CT Chest  . Palpitations 02/10/2017   a. 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   . Pneumonia 01/2018  . Raynaud's syndrome   . Splenomegaly 04/2017   noted on CT Chest  . SVT (supraventricular tachycardia) ([place])   . Systolic murmur   . Tendinopathy of rotator cuff    Right  . Tobacco use     Past Surgical History:  Procedure Laterality Date  . SACRO-ILIAC PINNING  2016   fusion, in [place]  . SHOULDER ARTHROSCOPY WITH OPEN ROTATOR CUFF REPAIR AND DISTAL CLAVICLE ACROMINECTOMY Right 07/15/2020   Procedure: SHOULDER ARTHROSCOPY WITH ROTATOR CUFF REPAIR AND DISTAL CLAVICLE [place] AND BICEPS TENDOESIS;  Surgeon: [name], MD;  Location: [facility];  Service: Orthopedics;  Laterality:  Right;  . TONSILLECTOMY     age 44  . TOTAL HIP ARTHROPLASTY Right 12/05/2018   Procedure: TOTAL HIP ARTHROPLASTY ANTERIOR APPROACH;  Surgeon: Frederik Pear, MD;  Location: [facility];  Service: Orthopedics;  Laterality: Right;    There were no vitals filed for this visit.   Subjective Assessment - 10/28/20 0809    Subjective Pt reports she fractured her Lt foot on 10/21.  She is in boot now, ambulating without AD.  She has been released from her surgeon for her shoulder. She voices interest in continuing PT to build strength in Rt shoulder (to PLOF.)   Currently in Pain? Yes    Pain Score 2     Pain Location Foot Lt. Sore.              [facility] PT Assessment - 10/28/20 0001      Assessment   Medical Diagnosis Rt shoulder RCR; biceps tendonesis; DCR; [place]; extensive debridement     Referring Provider (PT) Dr Ophelia Charter     Onset Date/Surgical Date 07/15/20    Hand Dominance Right    Next MD Visit PRN    Prior Therapy yes for shoulder and hip       Observation/Other Assessments   Focus on Therapeutic Outcomes (FOTO)  25% limitation  AROM   Right Shoulder Extension 40 Degrees    Left Shoulder Extension 47 Degrees      Strength   Right/Left Shoulder Right    Right Shoulder Flexion 4/5    Right Shoulder Extension 5/5    Right Shoulder ABduction 4/5   with discomfort   Right Shoulder Internal Rotation 4+/5    Right Shoulder External Rotation 4/5   with some discomfort           [facility] Adult PT Treatment/Exercise - 10/28/20 0001      Shoulder Exercises: Seated   Row Both;10 reps   2 sets   Theraband Level (Shoulder Row) Level 2 (Red)    External Rotation Strengthening;Both;10 reps   2 sets    Theraband Level (Shoulder External Rotation) Level 1 (Yellow)    Internal Rotation Strengthening;Right;10 reps   2 sets   Theraband Level (Shoulder Internal Rotation) Level 1 (Yellow)    Flexion Strengthening;Right;10 reps   to 90 deg   Theraband Level (Shoulder Flexion) --   trial  with yellow band x 5    Flexion Weight (lbs) 2    Abduction Strengthening;Right;10 reps   scaption to 90 deg, 2 sets   ABduction Weight (lbs) 1   unable to tolerate 2#   Other Seated Exercises bicep curl to overhead press x 2# x 10 with RUE      Shoulder Exercises: Pulleys   Flexion 3 minutes    Scaption 3 minutes      Shoulder Exercises: Stretch   Other Shoulder Stretches Rt bicep stretch x 30 sec x 3 reps, repeated 1 rep after resistance exercises.     Other Shoulder Stretches 3 position doorway stretch x 20 sec each; midlevel repeated after resistance exercises.       Modalities   Modalities --   to reduce soreness post exercise.      Vasopneumatic   Number Minutes Vasopneumatic  10 minutes    Vasopnuematic Location  Shoulder   rt   Vasopneumatic Pressure Medium    Vasopneumatic Temperature  34 deg                   PT Education - 10/28/20 1101    Education Details HEP - updated.    Person(s) Educated Patient    Methods Explanation;Handout;Demonstration;Verbal cues    Comprehension Verbalized understanding;Returned demonstration            PT Short Term Goals - 10/05/20 1536      PT SHORT TERM GOAL #1   Title Independent in initial exercise program    Time 6    Period Weeks    Status Achieved    Target Date 09/17/20      PT SHORT TERM GOAL #2   Title improve posture and alignment with patient demonstrating good posterior shoulder girdle contraction/control    Time 6    Period Weeks    Status Achieved    Target Date 09/17/20      PT SHORT TERM GOAL #3   Title PROM to 140 deg flexion; 40 deg ER at neurtal; 60-80 deg abd without rotation per protocol    Time 6    Period Weeks    Status Achieved    Target Date 09/17/20             PT Long Term Goals - 10/28/20 1053      PT LONG TERM GOAL #1   Title Improve posture and alignment with patient to demonstrate  improved upright posture with posterior shoulder girdle engaged    Time 18    Period  Weeks    Status Revised    Target Date 12/09/20      PT LONG TERM GOAL #2   Title Increase AROM Rt shoulder to functional level with no pain    Time 12    Period Weeks    Status Achieved      PT LONG TERM GOAL #3   Title 4+/5 to 5/5 shoulder strength    Baseline -    Time 18    Period Weeks    Status Revised    Target Date 12/09/20      PT LONG TERM GOAL #4   Title Independent in HEP    Time 18    Period Weeks    Status On-going    Target Date 12/09/20      PT LONG TERM GOAL #5   Title Improve FOTO to 28% limitation 02/07/2019    Time 12    Period Weeks    Status Achieved                 Plan - 10/28/20 1054    Clinical Impression Statement Pt has been away from therapy for 3 wks since sustaining a fracture to Lt foot. Pt has reported reduced compliance of shoulder HEP over last few weeks, but have voiced interest to return to therapy to increase strength in Rt shoulder to assist with tasks she completes at work. Pt has met her FOTO goal.  Slight improvements in Rt shoulder strength; continued limitations in ER and abductions.  HEP updated. She has partially met her goals and would benefit from continued PT intervention to maximize rehab potential and prevent re-injury.    Rehab Potential Good    PT Frequency 2x / week    PT Duration 12 weeks    PT Treatment/Interventions ADLs/Self Care Home Management;Aquatic Therapy;Cryotherapy;Electrical Stimulation;Iontophoresis 77m/ml Dexamethasone;Moist Heat;Ultrasound;Therapeutic activities;Therapeutic exercise;Neuromuscular re-education;Manual techniques;Dry needling;Taping    PT Next Visit Plan continue DN as indicated; strengthening - progressing gradually    PT Home Exercise Plan [email or web address]    Consulted and Agree with Plan of Care Patient           Patient will benefit from skilled therapeutic intervention in order to improve the following deficits and impairments:  Decreased range of motion, Increased muscle spasms,  Impaired UE functional use, Decreased activity tolerance, Pain, Improper body mechanics, Decreased mobility, Decreased strength, Increased edema, Postural dysfunction, Impaired flexibility  Visit Diagnosis: Chronic right shoulder pain - Plan: PT plan of care cert/re-cert  Other symptoms and signs involving the musculoskeletal system - Plan: PT plan of care cert/re-cert  Abnormal posture - Plan: PT plan of care cert/re-cert  Weakness generalized - Plan: PT plan of care cert/re-cert     Problem List Patient Active Problem List   Diagnosis Date Noted  . Injury of right toe 03/09/2020  . Fracture of one rib, right side, subsequent encounter for fracture with routine healing 01/06/2020  . Cellulitis of leg 12/09/2018  . History of total hip arthroplasty, right 12/05/2018  . [place] necrosis ([place] 09/10/2018  . Acute right hip pain 08/15/2018  . 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 Chronic right shoulder pain 07/23/2018  . Morbid [place] ([place] 12/25/2017  . Family history of autoimmune disorder 12/18/2017  . Transaminitis 05/05/2017  . Lung nodule < 6cm on CT 05/05/2017  . Fever 04/27/2017  . PSVT (paroxysmal supraventricular tachycardia) (  [place]) 03/13/2017  . Tobacco use 03/13/2017  . Palpitations 02/10/2017  . GAD (generalized anxiety disorder) 02/10/2017  . Hypertension 02/10/2017  . Abnormal thyroid blood test 02/10/2017   [name], PTA 10/28/20 2:08 PM   Celyn P. Helene Kelp PT, MPH 10/28/20 2:08 PM    [facility] [address] [place], [place], [address] Phone: [phone]   Fax:  [phone]  Name: Deborah Goodman MRN: [id number] Date of Birth: [birth year]

## 2020-11-02 ENCOUNTER — Encounter: Payer: No Typology Code available for payment source | Admitting: Rehabilitative and Restorative Service Providers"

## 2020-11-06 ENCOUNTER — Other Ambulatory Visit: Payer: Self-pay

## 2020-11-06 ENCOUNTER — Ambulatory Visit (INDEPENDENT_AMBULATORY_CARE_PROVIDER_SITE_OTHER): Payer: No Typology Code available for payment source | Admitting: Physical Therapy

## 2020-11-06 ENCOUNTER — Encounter: Payer: Self-pay | Admitting: Physical Therapy

## 2020-11-06 DIAGNOSIS — M25511 Pain in right shoulder: Secondary | ICD-10-CM | POA: Diagnosis not present

## 2020-11-06 DIAGNOSIS — R29898 Other symptoms and signs involving the musculoskeletal system: Secondary | ICD-10-CM

## 2020-11-06 DIAGNOSIS — R531 Weakness: Secondary | ICD-10-CM | POA: Diagnosis not present

## 2020-11-06 DIAGNOSIS — R293 Abnormal posture: Secondary | ICD-10-CM

## 2020-11-06 DIAGNOSIS — G8929 Other chronic pain: Secondary | ICD-10-CM

## 2020-11-06 NOTE — Therapy (Signed)
[facility] [place], [place], [address] Phone: [phone]   Fax:  [phone]  Physical Therapy Treatment  Patient Details  Name: Deborah Goodman MRN: [id number] Date of Birth: 03-Jun-1976 Referring Provider (PT): Dr Ophelia Charter    Encounter Date: 11/06/2020   PT End of Session - 11/06/20 0807    Visit Number 17    Number of Visits 28    Date for PT Re-Evaluation 12/09/20    PT Start Time 0803    PT Stop Time 0838    PT Time Calculation (min) 35 min    Activity Tolerance Patient tolerated treatment well;No increased pain    Behavior During Therapy WFL for tasks assessed/performed           Past Medical History:  Diagnosis Date  . Acid reflux   . Avascular necrosis ([place]) 08/2018   Right hip  . Complication of anesthesia    needs glide scope because trachea sit to the side not in the middle after SI surgery did not have a voice  . Depression   . Fatty liver 04/2017   noted on CT Chest  . Fibromyalgia   . GAD (generalized anxiety disorder) 02/10/2017  . Hypertension 02/10/2017  . Lung nodule 04/2017   Stable 4 mm nodule in the periphery of the lateral segment , right, noted on CT Chest  . Palpitations 02/10/2017   a. 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   . Pneumonia 01/2018  . Raynaud's syndrome   . Splenomegaly 04/2017   noted on CT Chest  . SVT (supraventricular tachycardia) ([place])   . Systolic murmur   . Tendinopathy of rotator cuff    Right  . Tobacco use     Past Surgical History:  Procedure Laterality Date  . SACRO-ILIAC PINNING  2016   fusion, in [place]  . SHOULDER ARTHROSCOPY WITH OPEN ROTATOR CUFF REPAIR AND DISTAL CLAVICLE ACROMINECTOMY Right 07/15/2020   Procedure: SHOULDER ARTHROSCOPY WITH ROTATOR CUFF REPAIR AND DISTAL CLAVICLE [place] AND BICEPS TENDOESIS;  Surgeon: [name], MD;  Location: [facility];  Service: Orthopedics;  Laterality:  Right;  . TONSILLECTOMY     age 44  . TOTAL HIP ARTHROPLASTY Right 12/05/2018   Procedure: TOTAL HIP ARTHROPLASTY ANTERIOR APPROACH;  Surgeon: Frederik Pear, MD;  Location: [facility];  Service: Orthopedics;  Laterality: Right;    There were no vitals filed for this visit.   Subjective Assessment - 11/06/20 0808    Subjective Pt reports she has done her band exercises some.  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    Currently in Pain? Yes    Pain Score 1     Pain Location Shoulder    Pain Orientation Right    Pain Descriptors / Indicators Dull              [facility] PT Assessment - 11/06/20 0001      Assessment   Medical Diagnosis Rt shoulder RCR; biceps tendonesis; DCR; SAC; extensive debridement     Referring Provider (PT) Dr Ophelia Charter     Onset Date/Surgical Date 07/15/20    Hand Dominance Right    Next MD Visit PRN    Prior Therapy yes for shoulder and hip       Strength   Right Shoulder Flexion 4/5   with soreness   Right Shoulder ABduction 4+/5  mild pain   Right Shoulder Internal Rotation 5/5    Right Shoulder External Rotation 4+/5            [facility] Adult PT Treatment/Exercise - 11/06/20 0001      Shoulder Exercises: Seated   Extension Right;10 reps;Theraband    Theraband Level (Shoulder Extension) Level 2 (Red)    Row Strengthening;Both;15 reps    Theraband Level (Shoulder Row) Level 3 (Green)    Horizontal ABduction --    Theraband Level (Shoulder Horizontal ABduction) --    External Rotation Strengthening;Both;10 reps    Theraband Level (Shoulder External Rotation) Level 2 (Red)    Flexion Strengthening;Right;10 reps   to 90 deg   Flexion Weight (lbs) 1    Flexion Limitations unable to tolerate 2# due to soreness from work.     Abduction Strengthening;Right;10 reps   scaption to 90 deg   ABduction Weight (lbs) 1    Other Seated Exercises bicep curl to overhead press x 2# x 10 with RUE x 2  sets     Other Seated Exercises forward punch with green band x 10 each arm.       Shoulder Exercises: Pulleys   Flexion 2 minutes    Scaption 2 minutes      Shoulder Exercises: ROM/Strengthening   UBE (Upper Arm Bike) L3: 1 min each direction (seated)      Shoulder Exercises: Stretch   Other Shoulder Stretches Rt bicep stretch x 20 sec x 3 reps, repeated 1 rep after resistance exercises.     Other Shoulder Stretches midlevel doorway stretch x 20 sec x 2 reps, 2 sets                     PT Short Term Goals - 10/05/20 1536      PT SHORT TERM GOAL #1   Title Independent in initial exercise program    Time 6    Period Weeks    Status Achieved    Target Date 09/17/20      PT SHORT TERM GOAL #2   Title improve posture and alignment with patient demonstrating good posterior shoulder girdle contraction/control    Time 6    Period Weeks    Status Achieved    Target Date 09/17/20      PT SHORT TERM GOAL #3   Title PROM to 140 deg flexion; 40 deg ER at neurtal; 60-80 deg abd without rotation per protocol    Time 6    Period Weeks    Status Achieved    Target Date 09/17/20             PT Long Term Goals - 10/28/20 1053      PT LONG TERM GOAL #1   Title Improve posture and alignment with patient to demonstrate improved upright posture with posterior shoulder girdle engaged    Time 18    Period Weeks    Status Revised    Target Date 12/09/20      PT LONG TERM GOAL #2   Title Increase AROM Rt shoulder to functional level with no pain    Time 12    Period Weeks    Status Achieved      PT LONG TERM GOAL #3   Title 4+/5 to 5/5 shoulder strength    Baseline -    Time 18    Period Weeks    Status Revised    Target Date 12/09/20      PT  LONG TERM GOAL #4   Title Independent in HEP    Time 18    Period Weeks    Status On-going    Target Date 12/09/20      PT LONG TERM GOAL #5   Title Improve FOTO to 28% limitation 02/07/2019    Time 12    Period Weeks     Status Achieved                 Plan - 11/06/20 0827    Clinical Impression Statement Pt demonstrated improved strength in Rt shoulder; limited with Rt shoulder strength. Able to tolerate increased resistance for ER, but needed less resistance for shoulder flexion.  Pt tolerated all exercises without pain, only fatigue.  Progressing well towards remaining goals.    PT Treatment/Interventions ADLs/Self Care Home Management;Aquatic Therapy;Cryotherapy;Electrical Stimulation;Iontophoresis 4mg /ml Dexamethasone;Moist Heat;Ultrasound;Therapeutic activities;Therapeutic exercise;Neuromuscular re-education;Manual techniques;Dry needling;Taping    PT Next Visit Plan continue DN as indicated; strengthening - progressing gradually    PT Home Exercise Plan [id number]           Patient will benefit from skilled therapeutic intervention in order to improve the following deficits and impairments:  Decreased range of motion, Increased muscle spasms, Impaired UE functional use, Decreased activity tolerance, Pain, Improper body mechanics, Decreased mobility, Decreased strength, Increased edema, Postural dysfunction, Impaired flexibility  Visit Diagnosis: Chronic right shoulder pain  Other symptoms and signs involving the musculoskeletal system  Abnormal posture  Weakness generalized     Problem List Patient Active Problem List   Diagnosis Date Noted  . Injury of right toe 03/09/2020  . Fracture of one rib, right side, subsequent encounter for fracture with routine healing 01/06/2020  . Cellulitis of leg 12/09/2018  . History of total hip arthroplasty, right 12/05/2018  . Avascular necrosis ([place]) 09/10/2018  . Acute right hip pain 08/15/2018  . 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 Chronic right shoulder pain 07/23/2018  . Morbid obesity ([place]) 12/25/2017  . Family history of autoimmune disorder 12/18/2017  . Transaminitis 05/05/2017  . Lung nodule < 6cm on CT  05/05/2017  . Fever 04/27/2017  . PSVT (paroxysmal supraventricular tachycardia) ([place]) 03/13/2017  . Tobacco use 03/13/2017  . Palpitations 02/10/2017  . GAD (generalized anxiety disorder) 02/10/2017  . Hypertension 02/10/2017  . Abnormal thyroid blood test 02/10/2017   [name], PTA 11/06/20 8:44 AM  [facility] [place], [place], [address] Phone: [phone]   Fax:  [phone]  Name: Deborah Goodman MRN: [id number] Date of Birth: Nov 01, 1976

## 2020-11-20 ENCOUNTER — Other Ambulatory Visit: Payer: Self-pay

## 2020-11-20 ENCOUNTER — Encounter: Payer: Self-pay | Admitting: Physical Therapy

## 2020-11-20 ENCOUNTER — Ambulatory Visit (INDEPENDENT_AMBULATORY_CARE_PROVIDER_SITE_OTHER): Payer: No Typology Code available for payment source | Admitting: Physical Therapy

## 2020-11-20 DIAGNOSIS — G8929 Other chronic pain: Secondary | ICD-10-CM

## 2020-11-20 DIAGNOSIS — R29898 Other symptoms and signs involving the musculoskeletal system: Secondary | ICD-10-CM

## 2020-11-20 DIAGNOSIS — R293 Abnormal posture: Secondary | ICD-10-CM | POA: Diagnosis not present

## 2020-11-20 DIAGNOSIS — M25511 Pain in right shoulder: Secondary | ICD-10-CM

## 2020-11-20 DIAGNOSIS — R531 Weakness: Secondary | ICD-10-CM

## 2020-11-20 NOTE — Therapy (Signed)
[place], [place], [address] Phone: [phone]   Fax:  [phone]  Physical Therapy Treatment  Patient Details  Name: Deborah Goodman MRN: [id number] Date of Birth: [birth year] Referring Provider (PT): Dr Ophelia Charter    Encounter Date: 11/20/2020   PT End of Session - 11/20/20 0822    Visit Number 18    Number of Visits 28    Date for PT Re-Evaluation 12/09/20    PT Start Time 0803    PT Stop Time 0843    PT Time Calculation (min) 40 min    Activity Tolerance Patient tolerated treatment well;No increased pain    Behavior During Therapy WFL for tasks assessed/performed           Past Medical History:  Diagnosis Date  . Acid reflux   . Avascular necrosis ([place]) 08/2018   Right hip  . Complication of anesthesia    needs glide scope because trachea sit to the side not in the middle after SI surgery did not have a voice  . Depression   . Fatty liver 04/2017   noted on CT Chest  . Fibromyalgia   . GAD (generalized anxiety disorder) 02/10/2017  . Hypertension 02/10/2017  . Lung nodule 04/2017   Stable 4 mm nodule in the periphery of the lateral segment , right, noted on CT Chest  . Palpitations 02/10/2017   a. 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   . Pneumonia 01/2018  . Raynaud's syndrome   . Splenomegaly 04/2017   noted on CT Chest  . SVT (supraventricular tachycardia) ([place])   . Systolic murmur   . Tendinopathy of rotator cuff    Right  . Tobacco use     Past Surgical History:  Procedure Laterality Date  . SACRO-ILIAC PINNING  2016   fusion, in [place]  . SHOULDER ARTHROSCOPY WITH OPEN ROTATOR CUFF REPAIR AND DISTAL CLAVICLE ACROMINECTOMY Right 07/15/2020   Procedure: SHOULDER ARTHROSCOPY WITH ROTATOR CUFF REPAIR AND DISTAL CLAVICLE [place] AND BICEPS TENDOESIS;  Surgeon: [name], MD;  Location: [facility];  Service: Orthopedics;  Laterality: Right;   . TONSILLECTOMY     age 44  . TOTAL HIP ARTHROPLASTY Right 12/05/2018   Procedure: TOTAL HIP ARTHROPLASTY ANTERIOR APPROACH;  Surgeon: Frederik Pear, MD;  Location: [facility];  Service: Orthopedics;  Laterality: Right;    There were no vitals filed for this visit.   Subjective Assessment - 11/20/20 0809    Subjective "I've had some good days and some bad days."  Pt reports she had spasming in Rt (anterior) shoulder on 11/27.  Took Flexeril without change in symptoms. Spasming has decreased, but she remains concerned. She messaged her doctor inquiring about injection to shoulder.    Currently in Pain? Yes    Pain Score 1     Pain Location Shoulder    Pain Orientation Right;Anterior;Upper    Pain Descriptors / Indicators Spasm;Dull;Sore    Aggravating Factors  ?    Pain Relieving Factors ice, medication              [facility] PT Assessment - 11/20/20 0001      Assessment   Medical Diagnosis Rt shoulder RCR; biceps tendonesis; DCR; SAC; extensive debridement     Referring Provider (PT) Dr Ophelia Charter     Onset Date/Surgical Date 07/15/20    Hand Dominance Right    Next MD Visit PRN    Prior  Therapy yes for shoulder and hip       Strength   Right Shoulder Flexion 4+/5   with soreness   Right Shoulder Internal Rotation 4+/5    Right Shoulder External Rotation 4+/5            [facility] Adult PT Treatment/Exercise - 11/20/20 0001      Shoulder Exercises: Standing   External Rotation Strengthening;Right;10 reps   2 sets   Theraband Level (Shoulder External Rotation) Level 2 (Red)    Internal Rotation Strengthening;Right;10 reps   2 sets   Theraband Level (Shoulder Internal Rotation) Level 2 (Red)    Flexion Strengthening;Right;10 reps   2 sets, [place] punch   Theraband Level (Shoulder Flexion) Level 3 (Green)    Row Strengthening;Right;10 reps   [place], 2 sets   Theraband Level (Shoulder Row) Level 3 (Green)      Shoulder Exercises: ROM/Strengthening   UBE (Upper Arm Bike) L2: 1  min each direction (seated)      Shoulder Exercises: Stretch   Other Shoulder Stretches Rt bicep stretch x 30 sec x 3 reps    Other Shoulder Stretches 3 position doorway stretch x 20 sec each; midlevel repeated after resistance exercises.       Manual Therapy   Manual therapy comments I strip of reg [place] tape placed on mid bicep brachii belly to intertubercular groove area with 20% stretch, to decompress tissue and increase proprioception.     Soft tissue mobilization IASTM to Rt ant / post shoulder, along with upper trap and levator, to decrease fascial tightness .                    PT Short Term Goals - 10/05/20 1536      PT SHORT TERM GOAL #1   Title Independent in initial exercise program    Time 6    Period Weeks    Status Achieved    Target Date 09/17/20      PT SHORT TERM GOAL #2   Title improve posture and alignment with patient demonstrating good posterior shoulder girdle contraction/control    Time 6    Period Weeks    Status Achieved    Target Date 09/17/20      PT SHORT TERM GOAL #3   Title PROM to 140 deg flexion; 40 deg ER at neurtal; 60-80 deg abd without rotation per protocol    Time 6    Period Weeks    Status Achieved    Target Date 09/17/20             PT Long Term Goals - 10/28/20 1053      PT LONG TERM GOAL #1   Title Improve posture and alignment with patient to demonstrate improved upright posture with posterior shoulder girdle engaged    Time 18    Period Weeks    Status Revised    Target Date 12/09/20      PT LONG TERM GOAL #2   Title Increase AROM Rt shoulder to functional level with no pain    Time 12    Period Weeks    Status Achieved      PT LONG TERM GOAL #3   Title 4+/5 to 5/5 shoulder strength    Baseline -    Time 18    Period Weeks    Status Revised    Target Date 12/09/20      PT LONG TERM GOAL #4   Title Independent in HEP  Time 18    Period Weeks    Status On-going    Target Date 12/09/20      PT  LONG TERM GOAL #5   Title Improve FOTO to 28% limitation 02/07/2019    Time 12    Period Weeks    Status Achieved                 Plan - 11/20/20 1642    Clinical Impression Statement Pt demonstrated improved Rt shoulder strength since last assessment. Pt tolerated [place] 4 exercises for Rt shoulder well, with only report of fatigue afterwards.  No increased spasm or pain. She reported improved tolerance to middle door stretch at end of session after strengthening exercises and bicep stretch. Spasming last weekend at end of 3 day workweek likely due to increased load to RUE with work activities.  Encouraged pt to stretch daily, especially on days with heavy lifting at work. Pt may benefit from DN/manual therapy to RUE to assist with reduction of tightness. Pt making good progress towards goals.    Rehab Potential Good    PT Frequency 2x / week    PT Duration 12 weeks    PT Treatment/Interventions ADLs/Self Care Home Management;Aquatic Therapy;Cryotherapy;Electrical Stimulation;Iontophoresis 4mg /ml Dexamethasone;Moist Heat;Ultrasound;Therapeutic activities;Therapeutic exercise;Neuromuscular re-education;Manual techniques;Dry needling;Taping    PT Next Visit Plan continue DN as indicated; strengthening - progressing gradually    PT Home Exercise Plan [id number]           Patient will benefit from skilled therapeutic intervention in order to improve the following deficits and impairments:  Decreased range of motion, Increased muscle spasms, Impaired UE functional use, Decreased activity tolerance, Pain, Improper body mechanics, Decreased mobility, Decreased strength, Increased edema, Postural dysfunction, Impaired flexibility  Visit Diagnosis: Chronic right shoulder pain  Other symptoms and signs involving the musculoskeletal system  Abnormal posture  Weakness generalized     Problem List Patient Active Problem List   Diagnosis Date Noted  . Injury of right toe 03/09/2020  .  Fracture of one rib, right side, subsequent encounter for fracture with routine healing 01/06/2020  . Cellulitis of leg 12/09/2018  . History of total hip arthroplasty, right 12/05/2018  . Avascular necrosis ([place]) 09/10/2018  . Acute right hip pain 08/15/2018  . 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 Chronic right shoulder pain 07/23/2018  . Morbid obesity ([place]) 12/25/2017  . Family history of autoimmune disorder 12/18/2017  . Transaminitis 05/05/2017  . Lung nodule < 6cm on CT 05/05/2017  . Fever 04/27/2017  . PSVT (paroxysmal supraventricular tachycardia) ([place]) 03/13/2017  . Tobacco use 03/13/2017  . Palpitations 02/10/2017  . GAD (generalized anxiety disorder) 02/10/2017  . Hypertension 02/10/2017  . Abnormal thyroid blood test 02/10/2017   [name], PTA 11/20/20 4:51 PM  [facility], [place], [address] Phone: [phone]   Fax:  [phone]  Name: Deborah Goodman MRN: [id number] Date of Birth: 1976-08-22

## 2020-11-30 ENCOUNTER — Encounter: Payer: No Typology Code available for payment source | Admitting: Physical Therapy

## 2020-12-02 ENCOUNTER — Encounter: Payer: Self-pay | Admitting: Physical Therapy

## 2020-12-02 ENCOUNTER — Other Ambulatory Visit: Payer: Self-pay | Admitting: Osteopathic Medicine

## 2020-12-02 ENCOUNTER — Other Ambulatory Visit: Payer: Self-pay

## 2020-12-02 ENCOUNTER — Ambulatory Visit (INDEPENDENT_AMBULATORY_CARE_PROVIDER_SITE_OTHER): Payer: No Typology Code available for payment source | Admitting: Physical Therapy

## 2020-12-02 DIAGNOSIS — R293 Abnormal posture: Secondary | ICD-10-CM

## 2020-12-02 DIAGNOSIS — G8929 Other chronic pain: Secondary | ICD-10-CM

## 2020-12-02 DIAGNOSIS — R531 Weakness: Secondary | ICD-10-CM

## 2020-12-02 DIAGNOSIS — R29898 Other symptoms and signs involving the musculoskeletal system: Secondary | ICD-10-CM | POA: Diagnosis not present

## 2020-12-02 DIAGNOSIS — M25511 Pain in right shoulder: Secondary | ICD-10-CM

## 2020-12-02 NOTE — Therapy (Signed)
[place] [place] [place], [place], [address] Phone: [phone]   Fax:  [phone]  Physical Therapy Treatment  Patient Details  Name: Deborah Goodman MRN: [id number] Date of Birth: 10-09-1976 Referring Provider (PT): Dr Ophelia Charter    Encounter Date: 12/02/2020   PT End of Session - 12/02/20 1104    Visit Number 19    Number of Visits 28    Date for PT Re-Evaluation 12/09/20    PT Start Time 1101    PT Stop Time 1139    PT Time Calculation (min) 38 min    Activity Tolerance Patient tolerated treatment well;No increased pain    Behavior During Therapy WFL for tasks assessed/performed           Past Medical History:  Diagnosis Date  . Acid reflux   . Avascular necrosis ([place]) 08/2018   Right hip  . Complication of anesthesia    needs glide scope because trachea sit to the side not in the middle after SI surgery did not have a voice  . Depression   . Fatty liver 04/2017   noted on CT Chest  . Fibromyalgia   . GAD (generalized anxiety disorder) 02/10/2017  . Hypertension 02/10/2017  . Lung nodule 04/2017   Stable 4 mm nodule in the periphery of the lateral segment , right, noted on CT Chest  . Palpitations 02/10/2017   a. 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   . Pneumonia 01/2018  . Raynaud's syndrome   . Splenomegaly 04/2017   noted on CT Chest  . SVT (supraventricular tachycardia) ([place])   . Systolic murmur   . Tendinopathy of rotator cuff    Right  . Tobacco use     Past Surgical History:  Procedure Laterality Date  . SACRO-ILIAC PINNING  2016   fusion, in [place]  . SHOULDER ARTHROSCOPY WITH OPEN ROTATOR CUFF REPAIR AND DISTAL CLAVICLE ACROMINECTOMY Right 07/15/2020   Procedure: SHOULDER ARTHROSCOPY WITH ROTATOR CUFF REPAIR AND DISTAL CLAVICLE [place] AND BICEPS TENDOESIS;  Surgeon: [name], MD;  Location: [facility];  Service: Orthopedics;  Laterality:  Right;  . TONSILLECTOMY     age 44  . TOTAL HIP ARTHROPLASTY Right 12/05/2018   Procedure: TOTAL HIP ARTHROPLASTY ANTERIOR APPROACH;  Surgeon: Frederik Pear, MD;  Location: [facility];  Service: Orthopedics;  Laterality: Right;    There were no vitals filed for this visit.   Subjective Assessment - 12/02/20 1104    Subjective "I'm feeling looser, and pretty good".  She continues to have "good days and bad days"'' last few days have been pretty good.  She has been focusing on stretching; performing daily.  She has performed strengthening/ band exercise 1 time since last visit.  The ktape helped but fell off on the 3rd day. She is interested in DN in future session to help with residual tightness in shoulder/neck.    Currently in Pain? Yes    Pain Score 1     Pain Location Shoulder    Pain Orientation Right;Anterior;Upper    Pain Descriptors / Indicators Dull    Aggravating Factors  ?    Pain Relieving Factors medication, stretches              [facility] PT Assessment - 12/02/20 0001      Assessment   Medical Diagnosis Rt shoulder RCR; biceps tendonesis; DCR; SAC; extensive debridement     Referring Provider (PT) Dr Minna Antis  [place]     Onset Date/Surgical Date 07/15/20    Hand Dominance Right    Next MD Visit PRN    Prior Therapy yes for shoulder and hip       AROM   Right Shoulder Extension 44 Degrees      Strength   Right Shoulder Flexion 4+/5   with soreness in ant shoulder   Right Shoulder ABduction --   5-/5   Right Shoulder Internal Rotation 5/5    Right Shoulder External Rotation 5/5           [facility] Adult PT Treatment/Exercise - 12/02/20 0001      Shoulder Exercises: Seated   External Rotation Strengthening;Both;10 reps   2 sets   Theraband Level (Shoulder External Rotation) Level 3 (Green)    Other Seated Exercises bicep curl to overhead press x 5 reps with 5# with RUE      Shoulder Exercises: Standing   Flexion Strengthening;Right;10 reps   2 sets, [place] punch    Theraband Level (Shoulder Flexion) Level 3 (Green)    Row Strengthening;Right;10 reps   [place], 2 sets   Theraband Level (Shoulder Row) Level 3 (Green)    Diagonals Strengthening;Right;5 reps    Theraband Level (Shoulder Diagonals) Level 2 (Red)      Shoulder Exercises: ROM/Strengthening   UBE (Upper Arm Bike) L2: 1 min forward, 1.5 min backward (seated)      Shoulder Exercises: Stretch   Other Shoulder Stretches Rt bicep stretch x 30 sec x 3 reps    Other Shoulder Stretches 3 position doorway stretch x 20 sec each; midlevel repeated after resistance exercises.       Manual Therapy   Manual therapy comments I strip of reg Rock tape placed on mid bicep brachii belly to intertubercular groove area with 20% stretch, to decompress tissue and increase proprioception.     Soft tissue mobilization IASTM to Rt ant / post shoulder to decrease fascial tightness .                    PT Short Term Goals - 10/05/20 1536      PT SHORT TERM GOAL #1   Title Independent in initial exercise program    Time 6    Period Weeks    Status Achieved    Target Date 09/17/20      PT SHORT TERM GOAL #2   Title improve posture and alignment with patient demonstrating good posterior shoulder girdle contraction/control    Time 6    Period Weeks    Status Achieved    Target Date 09/17/20      PT SHORT TERM GOAL #3   Title PROM to 140 deg flexion; 40 deg ER at neurtal; 60-80 deg abd without rotation per protocol    Time 6    Period Weeks    Status Achieved    Target Date 09/17/20             PT Long Term Goals - 12/02/20 1643      PT LONG TERM GOAL #1   Title Improve posture and alignment with patient to demonstrate improved upright posture with posterior shoulder girdle engaged    Time 18    Period Weeks    Status On-going      PT LONG TERM GOAL #2   Title Increase AROM Rt shoulder to functional level with no pain    Time 12    Period Weeks    Status Achieved  PT LONG  TERM GOAL #3   Title 4+/5 to 5/5 shoulder strength    Baseline -    Time 18    Period Weeks    Status Achieved      PT LONG TERM GOAL #4   Title Independent in HEP    Time 18    Period Weeks    Status Partially Met      PT LONG TERM GOAL #5   Title Improve FOTO to 28% limitation 02/07/2019    Time 12    Period Weeks    Status Achieved                 Plan - 12/02/20 1120    Clinical Impression Statement Pt demonstrated improved Rt shoulder strength; limited strength in Rt shoulder flexion compared to LUE along with some discomfort.  Tightness/ tenderness found in Rt upper trap, levator, biceps brachii and brachialis; improved with IASTM.  May benefit from DN to these areas during next visit.  Anticipate d/c after next visit.    Rehab Potential Good    PT Frequency 2x / week    PT Duration 12 weeks    PT Treatment/Interventions ADLs/Self Care Home Management;Aquatic Therapy;Cryotherapy;Electrical Stimulation;Iontophoresis 65m/ml Dexamethasone;Moist Heat;Ultrasound;Therapeutic activities;Therapeutic exercise;Neuromuscular re-education;Manual techniques;Dry needling;Taping    PT Next Visit Plan continue DN as indicated; strengthening - progressing gradually    PT Home Exercise Plan [id number]           Patient will benefit from skilled therapeutic intervention in order to improve the following deficits and impairments:  Decreased range of motion,Increased muscle spasms,Impaired UE functional use,Decreased activity tolerance,Pain,Improper body mechanics,Decreased mobility,Decreased strength,Increased edema,Postural dysfunction,Impaired flexibility  Visit Diagnosis: Chronic right shoulder pain  Other symptoms and signs involving the musculoskeletal system  Abnormal posture  Weakness generalized     Problem List Patient Active Problem List   Diagnosis Date Noted  . Injury of right toe 03/09/2020  . Fracture of one rib, right side, subsequent encounter for fracture  with routine healing 01/06/2020  . Cellulitis of leg 12/09/2018  . History of total hip arthroplasty, right 12/05/2018  . [place] necrosis ([place] 09/10/2018  . Acute right hip pain 08/15/2018  . 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 Chronic right shoulder pain 07/23/2018  . Morbid obesity ([place] 12/25/2017  . Family history of autoimmune disorder 12/18/2017  . Transaminitis 05/05/2017  . Lung nodule < 6cm on CT 05/05/2017  . Fever 04/27/2017  . PSVT (paroxysmal [place]ricular tachycardia) ([place] 03/13/2017  . Tobacco use 03/13/2017  . Palpitations 02/10/2017  . GAD (generalized anxiety disorder) 02/10/2017  . Hypertension 02/10/2017  . Abnormal thyroid blood test 02/10/2017   JKerin Perna PTA 12/02/20 4:49 PM  [place] [address]Phone: [phone]  Fax:  [phone] Name: Deborah COOPMRN: [id number] of Birth: 6Sep 04, 1977

## 2020-12-07 ENCOUNTER — Encounter: Payer: Self-pay | Admitting: Osteopathic Medicine

## 2020-12-08 ENCOUNTER — Other Ambulatory Visit: Payer: Self-pay

## 2020-12-08 ENCOUNTER — Encounter: Payer: Self-pay | Admitting: Physical Therapy

## 2020-12-08 ENCOUNTER — Ambulatory Visit (INDEPENDENT_AMBULATORY_CARE_PROVIDER_SITE_OTHER): Payer: No Typology Code available for payment source | Admitting: Physical Therapy

## 2020-12-08 DIAGNOSIS — R293 Abnormal posture: Secondary | ICD-10-CM | POA: Diagnosis not present

## 2020-12-08 DIAGNOSIS — R29898 Other symptoms and signs involving the musculoskeletal system: Secondary | ICD-10-CM

## 2020-12-08 DIAGNOSIS — M25611 Stiffness of right shoulder, not elsewhere classified: Secondary | ICD-10-CM

## 2020-12-08 DIAGNOSIS — R531 Weakness: Secondary | ICD-10-CM

## 2020-12-08 DIAGNOSIS — G8929 Other chronic pain: Secondary | ICD-10-CM

## 2020-12-08 DIAGNOSIS — M25511 Pain in right shoulder: Secondary | ICD-10-CM

## 2020-12-08 NOTE — Therapy (Addendum)
[place], [place], [address] Phone: [phone]   Fax:  [phone]  Physical Therapy Treatment/Discharge  Patient Details  Name: Deborah Goodman MRN: [id number] Date of Birth: 23-Oct-1976 Referring Provider (PT): Dr Ophelia Charter    Encounter Date: 12/08/2020   PT End of Session - 12/08/20 1053    Visit Number 20    Number of Visits 28    PT Start Time 9417    PT Stop Time 4081    PT Time Calculation (min) 51 min    Activity Tolerance Patient tolerated treatment well;No increased pain    Behavior During Therapy WFL for tasks assessed/performed           Past Medical History:  Diagnosis Date  . Acid reflux   . Avascular necrosis ([place]) 08/2018   Right hip  . Complication of anesthesia    needs glide scope because trachea sit to the side not in the middle after SI surgery did not have a voice  . Depression   . Fatty liver 04/2017   noted on CT Chest  . Fibromyalgia   . GAD (generalized anxiety disorder) 02/10/2017  . Hypertension 02/10/2017  . Lung nodule 04/2017   Stable 4 mm nodule in the periphery of the lateral segment , right, noted on CT Chest  . Palpitations 02/10/2017   a. 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   . Pneumonia 01/2018  . Raynaud's syndrome   . Splenomegaly 04/2017   noted on CT Chest  . SVT (supraventricular tachycardia) ([place])   . Systolic murmur   . Tendinopathy of rotator cuff    Right  . Tobacco use     Past Surgical History:  Procedure Laterality Date  . SACRO-ILIAC PINNING  2016   fusion, in [place]  . SHOULDER ARTHROSCOPY WITH OPEN ROTATOR CUFF REPAIR AND DISTAL CLAVICLE ACROMINECTOMY Right 07/15/2020   Procedure: SHOULDER ARTHROSCOPY WITH ROTATOR CUFF REPAIR AND DISTAL CLAVICLE [place] AND BICEPS TENDOESIS;  Surgeon: [name], MD;  Location: [facility];  Service: Orthopedics;  Laterality: Right;  . TONSILLECTOMY     age  44  . TOTAL HIP ARTHROPLASTY Right 12/05/2018   Procedure: TOTAL HIP ARTHROPLASTY ANTERIOR APPROACH;  Surgeon: Frederik Pear, MD;  Location: [facility];  Service: Orthopedics;  Laterality: Right;    There were no vitals filed for this visit.   Subjective Assessment - 12/08/20 1057    Subjective Deborah Goodman reports she had her covid boster yesterday in the Lt arm and it is very sore today. She feels like therapy is helping her however she is concerned about the cost of therapy. Would like to be on hold for 2 wks and see how she does on her own.    Currently in Pain? --   Lt shoulder is so sore she can't tell about her rt arm                            [facility] Adult PT Treatment/Exercise - 12/08/20 0001      Exercises   Exercises Shoulder      Shoulder Exercises: ROM/Strengthening   UBE (Upper Arm Bike) L2x5' alt FWD/BWD      Shoulder Exercises: Stretch   Other Shoulder Stretches sink stretch for lats and posterior shoulder, shoulder stretches with strap attached to wall.      Modalities   Modalities Cryotherapy  Cryotherapy   Number Minutes Cryotherapy 10 Minutes    Cryotherapy Location Shoulder   bilat Rt for DN, Lt for injection   Type of Cryotherapy Ice pack      Manual Therapy   Manual therapy comments skilled palpation and monitoring during DN    Soft tissue mobilization STM to Lt shoulder pecs, biceps, deltoid and posterior shoulder teres minor /major            Trigger Point Dry Needling - 12/08/20 0001    Consent Given? Yes    Education Handout Provided Previously provided    Muscles Treated Upper Quadrant Biceps;Teres minor;Teres major                  PT Short Term Goals - 12/08/20 1149      PT SHORT TERM GOAL #1   Title Independent in initial exercise program    Status Achieved      PT SHORT TERM GOAL #2   Title improve posture and alignment with patient demonstrating good posterior shoulder girdle contraction/control    Status Achieved       PT SHORT TERM GOAL #3   Title PROM to 140 deg flexion; 40 deg ER at neurtal; 60-80 deg abd without rotation per protocol    Status Achieved             PT Long Term Goals - 12/08/20 1149      PT LONG TERM GOAL #1   Title Improve posture and alignment with patient to demonstrate improved upright posture with posterior shoulder girdle engaged    Status Achieved      PT LONG TERM GOAL #2   Title Increase AROM Rt shoulder to functional level with no pain    Status Partially Met   has intermittent pain with functional activities     PT LONG TERM GOAL #3   Title 4+/5 to 5/5 shoulder strength    Status Achieved      PT LONG TERM GOAL #4   Title Independent in HEP    Status Achieved      PT LONG TERM GOAL #5   Title Improve FOTO to 28% limitation 02/07/2019    Status Achieved                 Plan - 12/08/20 1146    Clinical Impression Statement Deborah Goodman had a lot of tightness in her Lt bicep and teres minor/major - she had good releases with manual work.  She feels like she has a good base of exercises and wishes to perform these at home for two weeks. If she has any problems she will call and return for a reassessment.  If we don't hear back from her we will discharge her. Almost all goals were met.    Rehab Potential Good    PT Frequency 2x / week    PT Duration 12 weeks    PT Treatment/Interventions ADLs/Self Care Home Management;Aquatic Therapy;Cryotherapy;Electrical Stimulation;Iontophoresis 31m/ml Dexamethasone;Moist Heat;Ultrasound;Therapeutic activities;Therapeutic exercise;Neuromuscular re-education;Manual techniques;Dry needling;Taping    PT Next Visit Plan place on hold for two weeks    PT Home Exercise Plan [id number]    Consulted and Agree with Plan of Care Patient           Patient will benefit from skilled therapeutic intervention in order to improve the following deficits and impairments:  Decreased range of motion,Increased muscle spasms,Impaired UE  functional use,Decreased activity tolerance,Pain,Improper body mechanics,Decreased mobility,Decreased strength,Increased edema,Postural dysfunction,Impaired flexibility  Visit Diagnosis: Chronic right  shoulder pain  Other symptoms and signs involving the musculoskeletal system  Abnormal posture  Weakness generalized  Stiffness of joint, shoulder region, right     Problem List Patient Active Problem List   Diagnosis Date Noted  . Injury of right toe 03/09/2020  . Fracture of one rib, right side, subsequent encounter for fracture with routine healing 01/06/2020  . Cellulitis of leg 12/09/2018  . History of total hip arthroplasty, right 12/05/2018  . Avascular necrosis ([place]) 09/10/2018  . Acute right hip pain 08/15/2018  . 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 Chronic right shoulder pain 07/23/2018  . Morbid obesity ([place]) 12/25/2017  . Family history of autoimmune disorder 12/18/2017  . Transaminitis 05/05/2017  . Lung nodule < 6cm on CT 05/05/2017  . Fever 04/27/2017  . PSVT (paroxysmal supraventricular tachycardia) ([place]) 03/13/2017  . Tobacco use 03/13/2017  . Palpitations 02/10/2017  . GAD (generalized anxiety disorder) 02/10/2017  . Hypertension 02/10/2017  . Abnormal thyroid blood test 02/10/2017    Jeral Pinch PT 12/08/2020, 11:51 AM  [facility] [place], [place], [address] Phone: [phone]   Fax:  [phone]  Name: MATHEW STORCK MRN: [id number] Date of Birth: 05/21/1976   PHYSICAL THERAPY DISCHARGE SUMMARY  Visits from Start of Care: 20  Current functional level related to goals / functional outcomes: See above for function at last visit.     Remaining deficits: She requested to perform her HEP as her copay/deductible is very high.  Her goals were mainly met   Education / Equipment: HEP  Plan: Patient agrees to discharge.  Patient goals were partially  met. Patient is being discharged due to financial reasons.  ?????    Jeral Pinch, PT 01/14/21 12:46 PM

## 2021-01-04 ENCOUNTER — Encounter: Payer: Self-pay | Admitting: Osteopathic Medicine

## 2021-01-04 DIAGNOSIS — F411 Generalized anxiety disorder: Secondary | ICD-10-CM

## 2021-01-04 DIAGNOSIS — I10 Essential (primary) hypertension: Secondary | ICD-10-CM

## 2021-01-04 MED ORDER — SERTRALINE HCL 50 MG PO TABS: 50.0000 mg | ORAL_TABLET | Freq: Every day | ORAL | 2 refills | Status: DC

## 2021-01-04 MED ORDER — METOPROLOL SUCCINATE ER 50 MG PO TB24: ORAL_TABLET | ORAL | 2 refills | Status: DC

## 2021-01-04 MED ORDER — VITAMIN D (ERGOCALCIFEROL) 1.25 MG (50000 UNIT) PO CAPS: 50000.0000 [IU] | ORAL_CAPSULE | ORAL | 0 refills | Status: DC

## 2021-01-04 NOTE — Telephone Encounter (Signed)
Not seen since a year ago, please advise.

## 2021-01-04 NOTE — Addendum Note (Signed)
Addended byLoma Boston, Luvenia Starch L on: 01/04/2021 12:12 PM   Modules accepted: Orders

## 2021-01-05 NOTE — Telephone Encounter (Signed)
Deborah Goodman - I already sent prescription but patient was very resistant to appt, I am going to let you talk with her. See previous messages.

## 2021-03-28 ENCOUNTER — Other Ambulatory Visit: Payer: Self-pay | Admitting: Osteopathic Medicine

## 2021-03-28 DIAGNOSIS — F411 Generalized anxiety disorder: Secondary | ICD-10-CM

## 2021-03-30 ENCOUNTER — Encounter (HOSPITAL_BASED_OUTPATIENT_CLINIC_OR_DEPARTMENT_OTHER): Payer: Self-pay | Admitting: Nurse Practitioner

## 2021-04-01 ENCOUNTER — Other Ambulatory Visit: Payer: Self-pay

## 2021-04-01 DIAGNOSIS — I10 Essential (primary) hypertension: Secondary | ICD-10-CM

## 2021-04-01 DIAGNOSIS — F411 Generalized anxiety disorder: Secondary | ICD-10-CM

## 2021-04-01 MED ORDER — METOPROLOL SUCCINATE ER 50 MG PO TB24: ORAL_TABLET | ORAL | 0 refills | Status: DC

## 2021-04-01 MED ORDER — SERTRALINE HCL 50 MG PO TABS: 50.0000 mg | ORAL_TABLET | Freq: Every day | ORAL | 0 refills | Status: DC

## 2021-06-22 ENCOUNTER — Other Ambulatory Visit: Payer: Self-pay | Admitting: Osteopathic Medicine

## 2021-06-22 DIAGNOSIS — F411 Generalized anxiety disorder: Secondary | ICD-10-CM

## 2021-06-22 DIAGNOSIS — I10 Essential (primary) hypertension: Secondary | ICD-10-CM

## 2021-07-13 ENCOUNTER — Other Ambulatory Visit (HOSPITAL_COMMUNITY): Payer: Self-pay

## 2021-07-13 MED ORDER — METHOCARBAMOL 500 MG PO TABS
500.0000 mg | ORAL_TABLET | Freq: Three times a day (TID) | ORAL | 0 refills | Status: DC | PRN
Start: 2021-07-13 — End: 2021-07-27
  Filled 2021-07-13: qty 30, 10d supply, fill #0

## 2021-07-13 MED ORDER — ACETAMINOPHEN EXTRA STRENGTH 500 MG PO CAPS
1000.0000 mg | ORAL_CAPSULE | Freq: Three times a day (TID) | ORAL | 1 refills | Status: DC | PRN
Start: 2021-07-13 — End: 2021-07-27

## 2021-07-13 MED ORDER — DICLOFENAC SODIUM 75 MG PO TBEC
75.0000 mg | DELAYED_RELEASE_TABLET | Freq: Two times a day (BID) | ORAL | 0 refills | Status: DC | PRN
  Filled 2021-07-13: qty 60, 30d supply, fill #0

## 2021-07-15 ENCOUNTER — Other Ambulatory Visit: Payer: Self-pay | Admitting: Osteopathic Medicine

## 2021-07-15 ENCOUNTER — Encounter: Payer: Self-pay | Admitting: Osteopathic Medicine

## 2021-07-15 DIAGNOSIS — F411 Generalized anxiety disorder: Secondary | ICD-10-CM

## 2021-07-16 NOTE — Telephone Encounter (Signed)
Okay to send refills of requested medicines for 90 days supply each with 0 refills

## 2021-07-20 ENCOUNTER — Other Ambulatory Visit (HOSPITAL_BASED_OUTPATIENT_CLINIC_OR_DEPARTMENT_OTHER): Payer: Self-pay

## 2021-07-20 ENCOUNTER — Other Ambulatory Visit: Payer: Self-pay

## 2021-07-20 DIAGNOSIS — I10 Essential (primary) hypertension: Secondary | ICD-10-CM

## 2021-07-20 DIAGNOSIS — F411 Generalized anxiety disorder: Secondary | ICD-10-CM

## 2021-07-20 MED ORDER — METOPROLOL SUCCINATE ER 50 MG PO TB24
ORAL_TABLET | ORAL | 0 refills | Status: DC
  Filled 2021-07-20: qty 90, 90d supply, fill #0

## 2021-07-20 MED ORDER — ALENDRONATE SODIUM 70 MG PO TABS
70.0000 mg | ORAL_TABLET | ORAL | 0 refills | Status: DC
Start: 2021-07-20 — End: 2021-07-27
  Filled 2021-07-20: qty 12, 84d supply, fill #0

## 2021-07-20 MED ORDER — SERTRALINE HCL 50 MG PO TABS
50.0000 mg | ORAL_TABLET | Freq: Every day | ORAL | 0 refills | Status: DC
  Filled 2021-07-20: qty 90, 90d supply, fill #0

## 2021-07-20 MED ORDER — VITAMIN D (ERGOCALCIFEROL) 1.25 MG (50000 UNIT) PO CAPS
50000.0000 [IU] | ORAL_CAPSULE | ORAL | 0 refills | Status: DC
  Filled 2021-07-20: qty 12, 84d supply, fill #0

## 2021-07-22 ENCOUNTER — Ambulatory Visit: Payer: No Typology Code available for payment source | Admitting: Osteopathic Medicine

## 2021-07-23 ENCOUNTER — Ambulatory Visit: Payer: No Typology Code available for payment source | Admitting: Osteopathic Medicine

## 2021-07-27 ENCOUNTER — Other Ambulatory Visit (HOSPITAL_BASED_OUTPATIENT_CLINIC_OR_DEPARTMENT_OTHER): Payer: Self-pay

## 2021-07-27 ENCOUNTER — Other Ambulatory Visit: Payer: Self-pay

## 2021-07-27 ENCOUNTER — Ambulatory Visit (INDEPENDENT_AMBULATORY_CARE_PROVIDER_SITE_OTHER): Payer: No Typology Code available for payment source | Admitting: Osteopathic Medicine

## 2021-07-27 ENCOUNTER — Encounter: Payer: Self-pay | Admitting: Osteopathic Medicine

## 2021-07-27 VITALS — BP 155/100 | HR 83 | Temp 98.2°F | Wt 209.1 lb

## 2021-07-27 DIAGNOSIS — Z0000 Encounter for general adult medical examination without abnormal findings: Secondary | ICD-10-CM

## 2021-07-27 DIAGNOSIS — R079 Chest pain, unspecified: Secondary | ICD-10-CM | POA: Diagnosis not present

## 2021-07-27 DIAGNOSIS — E559 Vitamin D deficiency, unspecified: Secondary | ICD-10-CM | POA: Diagnosis not present

## 2021-07-27 DIAGNOSIS — I10 Essential (primary) hypertension: Secondary | ICD-10-CM

## 2021-07-27 DIAGNOSIS — F411 Generalized anxiety disorder: Secondary | ICD-10-CM | POA: Diagnosis not present

## 2021-07-27 DIAGNOSIS — R002 Palpitations: Secondary | ICD-10-CM

## 2021-07-27 DIAGNOSIS — R748 Abnormal levels of other serum enzymes: Secondary | ICD-10-CM

## 2021-07-27 LAB — VITAMIN D 25 HYDROXY (VIT D DEFICIENCY, FRACTURES): Vit D, 25-Hydroxy: 25 ng/mL — ABNORMAL LOW (ref 30–100)

## 2021-07-27 MED ORDER — ALENDRONATE SODIUM 70 MG PO TABS
70.0000 mg | ORAL_TABLET | ORAL | 0 refills | Status: DC
Start: 2021-07-27 — End: 2022-01-04

## 2021-07-27 MED ORDER — CYCLOBENZAPRINE HCL 10 MG PO TABS
5.0000 mg | ORAL_TABLET | Freq: Three times a day (TID) | ORAL | 3 refills | Status: DC | PRN
Start: 2021-07-27 — End: 2022-09-26

## 2021-07-27 MED ORDER — VITAMIN D (ERGOCALCIFEROL) 1.25 MG (50000 UNIT) PO CAPS: 50000.0000 [IU] | ORAL_CAPSULE | ORAL | 0 refills | Status: DC

## 2021-07-27 MED ORDER — SERTRALINE HCL 50 MG PO TABS: 50.0000 mg | ORAL_TABLET | Freq: Every day | ORAL | 0 refills | Status: DC

## 2021-07-27 MED ORDER — NITROGLYCERIN 0.4 MG SL SUBL
0.4000 mg | SUBLINGUAL_TABLET | SUBLINGUAL | 0 refills | Status: DC | PRN
Start: 2021-07-27 — End: 2022-09-26

## 2021-07-27 MED ORDER — METOPROLOL SUCCINATE ER 50 MG PO TB24: ORAL_TABLET | ORAL | 0 refills | Status: DC

## 2021-07-27 NOTE — Patient Instructions (Addendum)
Exertional chest pain:  EKG looks ok If pain worse, please go to ER / call 911! Cardiology referral in place to likely arrange stress test Rx printed for nitroglycerin as needed for chest pain Please take BP meds as directed.  Nurse visit for BP check / unless cardiology can get you in sooner       General Preventive Care Most recent routine screening labs: ordered.  Blood pressure goal 130/80 or less.  Tobacco: don't! Please let me know if you need help quitting! Alcohol: responsible moderation is ok for most adults - if you have concerns about your alcohol intake, please talk to me!  Exercise: as tolerated to reduce risk of cardiovascular disease and diabetes. Strength training will also prevent osteoporosis.  Mental health: if need for mental health care (medicines, counseling, other), or concerns about moods, please let me know!  Sexual / Reproductive health: if need for STD testing, or if concerns with libido/pain problems, please let me know! If need to discuss family planning, please let me know!  Advanced Directive: Living Will and/or Healthcare Power of Attorney recommended for all adults, regardless of age or health.  Vaccines Flu vaccine: for almost everyone, every fall.  Shingles vaccine: after age 19.  Pneumonia vaccines: after age 24, or sooner if certain medical conditions. Tetanus booster: every 10 years = due 2026 / booster 3rd trimester of pregnancy COVID vaccine: THANKS for getting your vaccine! :)  Cancer screenings  Colon cancer screening: for everyone age 74-75. Colonoscopy available for all, many people also qualify for the Cologuard stool test  Breast cancer screening: mammogram at age 13 every other year at least, and annually after age 40.  Cervical cancer screening: Pap due 09/2022 Lung cancer screening: CT chest every year for those aged 62 to 57 years who have a 20 pack-year smoking history and currently smoke or have quit within the past 15 years   Infection screenings  HIV: recommended screening at least once age 37-65, more often as needed. Gonorrhea/Chlamydia: screening as needed, though many insurances require testing for anyone on birth control pills. Hepatitis C: recommended once for everyone age 0000000 TB: certain at-risk populations, or depending on work requirements and/or travel history Other Bone Density Test: recommended for women at age 83, men at age 66, sooner depending on risk factors Abdominal Aortic Aneurysm: screening with ultrasound recommended once for men age 32-75 who have ever smoked

## 2021-07-27 NOTE — Progress Notes (Signed)
Deborah Goodman is a 45 y.o. female who presents to  [place] at [facility]  today, 07/27/21, seeking care for the following:  Medication refills Exertional chest pain 5+ weeks or so feeling pressure when going up flight of stairs or up a hill. No SOB. No diaphoresis. Occasional feeling like heart skips a beat. No presyncope/syncope.      ASSESSMENT & PLAN with other pertinent findings:  The primary encounter diagnosis was Chest pain, exertional. Diagnoses of Annual physical exam, Hypertension, unspecified type, GAD (generalized anxiety disorder), Hypertension, unspecified type, Vitamin D deficiency, and Palpitation were also pertinent to this visit.   BP above goal, pt hasn't taken meds (+)fatigue, palpitations, CP - labs as below ER precautions reviewed  EKG in office NSR and no concerning ST changes Nitro Rx sent prn Zio patch ordered Needs cardio w/u!   Patient Instructions  Exertional chest pain:  EKG looks ok If pain worse, please go to ER / call 911! Cardiology referral in place to likely arrange stress test Rx printed for nitroglycerin as needed for chest pain Please take BP meds as directed.  Nurse visit for BP check / unless cardiology can get you in sooner       General Preventive Care Most recent routine screening labs: ordered.  Blood pressure goal 130/80 or less.  Tobacco: don't! Please let me know if you need help quitting! Alcohol: responsible moderation is ok for most adults - if you have concerns about your alcohol intake, please talk to me!  Exercise: as tolerated to reduce risk of cardiovascular disease and diabetes. Strength training will also prevent osteoporosis.  Mental health: if need for mental health care (medicines, counseling, other), or concerns about moods, please let me know!  Sexual / Reproductive health: if need for STD testing, or if concerns with libido/pain problems, please let me know! If  need to discuss family planning, please let me know!  Advanced Directive: Living Will and/or Healthcare Power of Attorney recommended for all adults, regardless of age or health.  Vaccines Flu vaccine: for almost everyone, every fall.  Shingles vaccine: after age 70.  Pneumonia vaccines: after age 17, or sooner if certain medical conditions. Tetanus booster: every 10 years = due 2026 / booster 3rd trimester of pregnancy COVID vaccine: THANKS for getting your vaccine! :)  Cancer screenings  Colon cancer screening: for everyone age 38-75. Colonoscopy available for all, many people also qualify for the Cologuard stool test  Breast cancer screening: mammogram at age 21 every other year at least, and annually after age 46.  Cervical cancer screening: Pap due 09/2022 Lung cancer screening: CT chest every year for those aged 28 to 18 years who have a 20 pack-year smoking history and currently smoke or have quit within the past 15 years  Infection screenings  HIV: recommended screening at least once age 78-65, more often as needed. Gonorrhea/Chlamydia: screening as needed, though many insurances require testing for anyone on birth control pills. Hepatitis C: recommended once for everyone age 0000000 TB: certain at-risk populations, or depending on work requirements and/or travel history Other Bone Density Test: recommended for women at age 76, men at age 3, sooner depending on risk factors Abdominal Aortic Aneurysm: screening with ultrasound recommended once for men age 40-75 who have ever smoked  Orders Placed This Encounter  Procedures   CBC   COMPLETE METABOLIC PANEL WITH GFR   Lipid panel   TSH   Vitamin D (25 hydroxy)  Ambulatory referral to Cardiology   LONG TERM MONITOR (3-14 DAYS)   EKG 12-Lead    Meds ordered this encounter  Medications   sertraline (ZOLOFT) 50 MG tablet    Sig: Take 1 tablet (50 mg total) by mouth daily.    Dispense:  90 tablet    Refill:  0   metoprolol  succinate (TOPROL-XL) 50 MG 24 hr tablet    Sig: TAKE 1 TABLET BY MOUTH ONCE A DAY WITH A MEAL OR IMMEDIATELY AFTER.    Dispense:  90 tablet    Refill:  0   alendronate (FOSAMAX) 70 MG tablet    Sig: Take 1 tablet (70 mg total) by mouth once a week. Take with a full glass of water on an empty stomach.    Dispense:  12 tablet    Refill:  0   cyclobenzaprine (FLEXERIL) 10 MG tablet    Sig: Take 0.5-1 tablets (5-10 mg total) by mouth 3 (three) times daily as needed for muscle spasms.    Dispense:  30 tablet    Refill:  3   Vitamin D, Ergocalciferol, (DRISDOL) 1.25 MG (50000 UNIT) CAPS capsule    Sig: Take 1 capsule (50,000 Units total) by mouth every 7 (seven) days. For 12 weeks    Dispense:  12 capsule    Refill:  0   nitroGLYCERIN (NITROSTAT) 0.4 MG SL tablet    Sig: Place 1 tablet (0.4 mg total) under the tongue every 5 (five) minutes as needed for chest pain. If chest pain severe or persists over 15 minutes call 911 / to ER    Dispense:  10 tablet    Refill:  0     See below for relevant physical exam findings  See below for recent lab and imaging results reviewed  Medications, allergies, PMH, PSH, SocH, [place] reviewed below    Follow-up instructions: Return in about 1 week (around 08/03/2021) for NURSE VISIT BP CHECK - TAKE MEDICATIONS WITHIN 24 HOURS OF VISIT! .CAN DEFER NURSE VISIT IF PT ABLE TO GET IN TO CARDIOLOGY.                                         Exam:  BP (!) 155/100 (BP Location: Left Arm, Patient Position: Sitting, Cuff Size: Normal)   Pulse 83   Temp 98.2 F (36.8 C) (Oral)   Wt 209 lb 1.3 oz (94.8 kg)   BMI 33.75 kg/m  Constitutional: VS see above. General Appearance: alert, well-developed, well-nourished, NAD Neck: No masses, trachea midline.  Respiratory: Normal respiratory effort. no wheeze, no rhonchi, no rales Cardiovascular: S1/S2 normal, no murmur, no rub/gallop auscultated. RRR.  Musculoskeletal: Gait normal.  Symmetric and independent movement of all extremitie Neurological: Normal balance/coordination. No tremor. Skin: warm, dry, intact.  Psychiatric: Normal judgment/insight. Normal mood and affect. Oriented x3.   Current Meds  Medication Sig   nitroGLYCERIN (NITROSTAT) 0.4 MG SL tablet Place 1 tablet (0.4 mg total) under the tongue every 5 (five) minutes as needed for chest pain. If chest pain severe or persists over 15 minutes call 911 / to ER    Allergies  Allergen Reactions   Fish Allergy Nausea And Vomiting    ALL SEAFOOD   Shellfish Allergy Nausea And Vomiting    ALL SEAFOOD   Sulfa Antibiotics Other (See Comments)    Thrush reaction Sores in mouth    Patient Active  Problem List   Diagnosis Date Noted   Injury of right toe 03/09/2020   Fracture of one rib, right side, subsequent encounter for fracture with routine healing 01/06/2020   Cellulitis of leg 12/09/2018   History of total hip arthroplasty, right 12/05/2018   Avascular necrosis ([place]) 09/10/2018   Acute right hip pain 08/15/2018   Status post right sacroiliac joint fusion 08/15/2018   Chronic right shoulder pain 07/23/2018   Morbid obesity ([place]) 12/25/2017   Family history of autoimmune disorder 12/18/2017   Transaminitis 05/05/2017   Lung nodule < 6cm on CT 05/05/2017   Fever 04/27/2017   PSVT (paroxysmal supraventricular tachycardia) ([place]) 03/13/2017   Tobacco use 03/13/2017   Palpitations 02/10/2017   GAD (generalized anxiety disorder) 02/10/2017   Hypertension 02/10/2017   Abnormal thyroid blood test 02/10/2017    Family History  Problem Relation Age of Onset   Depression Mother    Hypertension Mother    Cancer Mother        colon   Diabetes Father    Arrhythmia Sister        palpitations   Cancer Maternal Grandfather        COLON   Diabetes Paternal Grandfather     Social History   Tobacco Use  Smoking Status Every Day   Packs/day: 0.50   Years: 22.00   Pack years: 11.00   Types:  Cigarettes, E-cigarettes  Smokeless Tobacco Never    Past Surgical History:  Procedure Laterality Date   SACRO-ILIAC PINNING  2016   fusion, in [place] Right 07/15/2020   Procedure: SHOULDER ARTHROSCOPY WITH ROTATOR CUFF REPAIR AND DISTAL CLAVICLE [place];  Surgeon: [name], MD;  Location: [facility];  Service: Orthopedics;  Laterality: Right;   TONSILLECTOMY     age 62   TOTAL HIP ARTHROPLASTY Right 12/05/2018   Procedure: TOTAL HIP ARTHROPLASTY ANTERIOR APPROACH;  Surgeon: Frederik Pear, MD;  Location: [facility];  Service: Orthopedics;  Laterality: Right;    Immunization History  Administered Date(s) Administered   Influenza,inj,Quad PF,6+ Mos 10/05/2020   Influenza-Unspecified 10/02/2016, 10/05/2017, 09/07/2018, 09/14/2019   PFIZER(Purple Top)SARS-COV-2 Vaccination 12/14/2019, 01/04/2020, 12/07/2020   Tdap 07/20/2015    No results found for this or any previous visit (from the past 2160 hour(s)).  No results found.     All questions at time of visit were answered - patient instructed to contact office with any additional concerns or updates. ER/RTC precautions were reviewed with the patient as applicable.   Please note: manual typing as well as voice recognition software may have been used to produce this document - typos may escape review. Please contact Dr. Sheppard Coil for any needed clarifications.

## 2021-07-30 NOTE — Addendum Note (Signed)
Addended by: Maryla Morrow on: 07/30/2021 12:35 PM   Modules accepted: Orders

## 2021-08-02 ENCOUNTER — Encounter: Payer: Self-pay | Admitting: Osteopathic Medicine

## 2021-08-03 NOTE — Telephone Encounter (Signed)
Can we call lab see what's going on w/ T4? Thanks

## 2021-08-04 LAB — COMPLETE METABOLIC PANEL WITH GFR
AG Ratio: 1.5 (calc) (ref 1.0–2.5)
ALT: 129 U/L — ABNORMAL HIGH (ref 6–29)
AST: 93 U/L — ABNORMAL HIGH (ref 10–30)
Albumin: 4.4 g/dL (ref 3.6–5.1)
Alkaline phosphatase (APISO): 76 U/L (ref 31–125)
BUN: 12 mg/dL (ref 7–25)
CO2: 29 mmol/L (ref 20–32)
Calcium: 9.6 mg/dL (ref 8.6–10.2)
Chloride: 98 mmol/L (ref 98–110)
Creat: 0.61 mg/dL (ref 0.50–0.99)
Globulin: 3 g/dL (calc) (ref 1.9–3.7)
Glucose, Bld: 93 mg/dL (ref 65–99)
Potassium: 3.4 mmol/L — ABNORMAL LOW (ref 3.5–5.3)
Sodium: 137 mmol/L (ref 135–146)
Total Bilirubin: 1 mg/dL (ref 0.2–1.2)
Total Protein: 7.4 g/dL (ref 6.1–8.1)
eGFR: 113 mL/min/{1.73_m2} (ref >=60)

## 2021-08-04 LAB — LIPID PANEL
Cholesterol: 288 mg/dL — ABNORMAL HIGH (ref <200)
HDL: 99 mg/dL (ref >=50)
LDL Cholesterol (Calc): 165 mg/dL (calc) — ABNORMAL HIGH
Non-HDL Cholesterol (Calc): 189 mg/dL (calc) — ABNORMAL HIGH (ref <130)
Total CHOL/HDL Ratio: 2.9 (calc) (ref <5.0)
Triglycerides: 118 mg/dL (ref <150)

## 2021-08-04 LAB — CBC
HCT: 41.6 % (ref 35.0–45.0)
Hemoglobin: 14.1 g/dL (ref 11.7–15.5)
MCH: 33.6 pg — ABNORMAL HIGH (ref 27.0–33.0)
MCHC: 33.9 g/dL (ref 32.0–36.0)
MCV: 99 fL (ref 80.0–100.0)
MPV: 9.3 fL (ref 7.5–12.5)
Platelets: 207 10*3/uL (ref 140–400)
RBC: 4.2 10*6/uL (ref 3.80–5.10)
RDW: 12.9 % (ref 11.0–15.0)
WBC: 8 10*3/uL (ref 3.8–10.8)

## 2021-08-04 LAB — TSH: TSH: 5.04 mIU/L — ABNORMAL HIGH

## 2021-08-04 LAB — T4, FREE: Free T4: 1.2 ng/dL (ref 0.8–1.8)

## 2021-08-04 NOTE — Telephone Encounter (Signed)
Final results placed in provider's box for review.

## 2021-08-16 ENCOUNTER — Other Ambulatory Visit: Payer: No Typology Code available for payment source

## 2021-08-18 ENCOUNTER — Other Ambulatory Visit: Payer: Self-pay

## 2021-08-18 ENCOUNTER — Ambulatory Visit (INDEPENDENT_AMBULATORY_CARE_PROVIDER_SITE_OTHER): Payer: No Typology Code available for payment source

## 2021-08-18 DIAGNOSIS — R748 Abnormal levels of other serum enzymes: Secondary | ICD-10-CM | POA: Diagnosis not present

## 2021-10-04 NOTE — Progress Notes (Deleted)
Referring-Natalie Alexander, DO Reason for referral-chest pain  HPI: 45 year old female for evaluation of chest pain at request of Emeterio Reeve, DO.  Monitor January 2018 showed sinus rhythm with short episodes of SVT.  Echocardiogram March 2018 showed normal LV function.  Patient seen in the emergency room September 2022 with chest pain.  Troponins were normal.  Hemoglobin 14.2.  D-dimer 0.62.  SGOT 82 and SGPT 102.  CTA showed no pulmonary embolus or dissection.  Cardiology asked to evaluate.  Current Outpatient Medications  Medication Sig Dispense Refill   alendronate (FOSAMAX) 70 MG tablet Take 1 tablet (70 mg total) by mouth once a week. Take with a full glass of water on an empty stomach. 12 tablet 0   cyclobenzaprine (FLEXERIL) 10 MG tablet Take 0.5-1 tablets (5-10 mg total) by mouth 3 (three) times daily as needed for muscle spasms. 30 tablet 3   metoprolol succinate (TOPROL-XL) 50 MG 24 hr tablet TAKE 1 TABLET BY MOUTH ONCE A DAY WITH A MEAL OR IMMEDIATELY AFTER. 90 tablet 0   Multiple Vitamins-Minerals (MULTIVITAMIN GUMMIES ADULT PO) Take 2 tablets by mouth daily.     nitroGLYCERIN (NITROSTAT) 0.4 MG SL tablet Place 1 tablet (0.4 mg total) under the tongue every 5 (five) minutes as needed for chest pain. If chest pain severe or persists over 15 minutes call 911 / to ER 10 tablet 0   sertraline (ZOLOFT) 50 MG tablet Take 1 tablet (50 mg total) by mouth daily. 90 tablet 0   Vitamin D, Ergocalciferol, (DRISDOL) 1.25 MG (50000 UNIT) CAPS capsule Take 1 capsule (50,000 Units total) by mouth every 7 (seven) days. For 12 weeks 12 capsule 0   No current facility-administered medications for this visit.    Allergies  Allergen Reactions   Fish Allergy Nausea And Vomiting    ALL SEAFOOD   Shellfish Allergy Nausea And Vomiting    ALL SEAFOOD   Sulfa Antibiotics Other (See Comments)    Thrush reaction Sores in mouth     Past Medical History:  Diagnosis Date   Acid reflux     Avascular necrosis ([place]) 08/2018   Right hip   Complication of anesthesia    needs glide scope because trachea sit to the side not in the middle after SI surgery did not have a voice   Depression    Fatty liver 04/2017   noted on CT Chest   Fibromyalgia    GAD (generalized anxiety disorder) 02/10/2017   Hypertension 02/10/2017   Lung nodule 04/2017   Stable 4 mm nodule in the periphery of the lateral segment , right, noted on CT Chest   Palpitations 02/10/2017   a. 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    Pneumonia 01/2018   Raynaud's syndrome    Splenomegaly 04/2017   noted on CT Chest   SVT (supraventricular tachycardia) (HCC)    Systolic murmur    Tendinopathy of rotator cuff    Right   Tobacco use     Past Surgical History:  Procedure Laterality Date   SACRO-ILIAC PINNING  2016   fusion, in [place] ARTHROSCOPY WITH OPEN ROTATOR CUFF REPAIR AND DISTAL CLAVICLE ACROMINECTOMY Right 07/15/2020   Procedure: SHOULDER ARTHROSCOPY WITH ROTATOR CUFF REPAIR AND DISTAL CLAVICLE [place] AND BICEPS TENDOESIS;  Surgeon: [name], MD;  Location: [facility];  Service: Orthopedics;  Laterality: Right;   TONSILLECTOMY     age 75   TOTAL HIP ARTHROPLASTY Right  12/05/2018   Procedure: TOTAL HIP ARTHROPLASTY ANTERIOR APPROACH;  Surgeon: Frederik Pear, MD;  Location: [facility];  Service: Orthopedics;  Laterality: Right;    Social History   Socioeconomic History   Marital status: Single    Spouse name: Not on file   Number of children: Not on file   Years of education: Not on file   Highest education level: Not on file  Occupational History   Occupation: X-ray Engineer, production: [employer]  Tobacco Use   Smoking status: Every Day    Packs/day: 0.50    Years: 22.00    Pack years: 11.00    Types: Cigarettes, E-cigarettes   Smokeless tobacco: Never  Vaping Use   Vaping Use: Former  Substance and Sexual Activity   Alcohol use: Yes     Alcohol/week: 2.0 standard drinks    Types: 1 Glasses of wine, 1 Cans of beer per week    Comment: 3/week   Drug use: No   Sexual activity: Not Currently    Birth control/protection: None  Other Topics Concern   Not on file  Social History Narrative   Not on file   Social Determinants of Health   Financial Resource Strain: Not on file  Food Insecurity: Not on file  Transportation Needs: Not on file  Physical Activity: Not on file  Stress: Not on file  Social Connections: Not on file  Intimate Partner Violence: Not on file    Family History  Problem Relation Age of Onset   Depression Mother    Hypertension Mother    Cancer Mother        colon   Diabetes Father    Arrhythmia Sister        palpitations   Cancer Maternal Grandfather        COLON   Diabetes Paternal Grandfather     ROS: no fevers or chills, productive cough, hemoptysis, dysphasia, odynophagia, melena, hematochezia, dysuria, hematuria, rash, seizure activity, orthopnea, PND, pedal edema, claudication. Remaining systems are negative.  Physical Exam:   There were no vitals taken for this visit.  General:  Well developed/well nourished in NAD Skin warm/dry Patient not depressed No peripheral clubbing Back-normal HEENT-normal/normal eyelids Neck supple/normal carotid upstroke bilaterally; no bruits; no JVD; no thyromegaly chest - CTA/ normal expansion CV - RRR/normal S1 and S2; no murmurs, rubs or gallops;  PMI nondisplaced Abdomen -NT/ND, no HSM, no mass, + bowel sounds, no bruit 2+ femoral pulses, no bruits Ext-no edema, chords, 2+ DP Neuro-grossly nonfocal  ECG -July 27, 2021-normal sinus rhythm with no ST changes.  Personally reviewed  A/P  1 chest pain-  2 hypertension-patient's blood pressure is controlled.  Continue present medications.  3 palpitations-  4 tobacco abuse-patient counseled on discontinuing.  5 history of short burst of SVT  Kirk Ruths, MD

## 2021-10-11 ENCOUNTER — Ambulatory Visit: Payer: No Typology Code available for payment source | Admitting: Cardiology

## 2021-10-19 ENCOUNTER — Telehealth: Payer: No Typology Code available for payment source | Admitting: Physician Assistant

## 2021-10-19 ENCOUNTER — Other Ambulatory Visit: Payer: Self-pay | Admitting: Osteopathic Medicine

## 2021-10-19 DIAGNOSIS — J208 Acute bronchitis due to other specified organisms: Secondary | ICD-10-CM

## 2021-10-19 DIAGNOSIS — B9689 Other specified bacterial agents as the cause of diseases classified elsewhere: Secondary | ICD-10-CM | POA: Diagnosis not present

## 2021-10-19 DIAGNOSIS — I10 Essential (primary) hypertension: Secondary | ICD-10-CM

## 2021-10-19 DIAGNOSIS — F411 Generalized anxiety disorder: Secondary | ICD-10-CM

## 2021-10-19 MED ORDER — DOXYCYCLINE HYCLATE 100 MG PO TABS: 100.0000 mg | ORAL_TABLET | Freq: Two times a day (BID) | ORAL | 0 refills | Status: DC

## 2021-10-19 MED ORDER — ALBUTEROL SULFATE HFA 108 (90 BASE) MCG/ACT IN AERS
2.0000 | INHALATION_SPRAY | Freq: Four times a day (QID) | RESPIRATORY_TRACT | 0 refills | Status: DC | PRN
Start: 2021-10-19 — End: 2022-09-26

## 2021-10-19 MED ORDER — BENZONATATE 100 MG PO CAPS
100.0000 mg | ORAL_CAPSULE | Freq: Three times a day (TID) | ORAL | 0 refills | Status: DC | PRN
Start: 2021-10-19 — End: 2022-09-26

## 2021-10-19 NOTE — Progress Notes (Signed)
We are sorry that you are not feeling well.  Here is how we plan to help!  Based on your presentation I believe you most likely have A cough due to bacteria.  When patients have a fever and a productive cough with a change in color or increased sputum production, we are concerned about bacterial bronchitis.  If left untreated it can progress to pneumonia.  If your symptoms do not improve with your treatment plan it is important that you contact your provider.   I have prescribed Doxycycline 100 mg twice a day for 7 days     In addition you may use A prescription cough medication called Tessalon Perles 100mg . You may take 1-2 capsules every 8 hours as needed for your cough.. I have also sent in a script for an albuterol inhaler to use as directed in case of chest tightness or wheezing.  Since you have a history of SVT and osteoporosis, we really need to be mindful of steroid use. Since you were given a 5-day burst without any improvement I would not recommend another course at this point and time.   From your responses in the eVisit questionnaire you describe inflammation in the upper respiratory tract which is causing a significant cough.  This is commonly called Bronchitis and has four common causes:   Allergies Viral Infections Acid Reflux Bacterial Infection Allergies, viruses and acid reflux are treated by controlling symptoms or eliminating the cause. An example might be a cough caused by taking certain blood pressure medications. You stop the cough by changing the medication. Another example might be a cough caused by acid reflux. Controlling the reflux helps control the cough.  USE OF BRONCHODILATOR ("RESCUE") INHALERS: There is a risk from using your bronchodilator too frequently.  The risk is that over-reliance on a medication which only relaxes the muscles surrounding the breathing tubes can reduce the effectiveness of medications prescribed to reduce swelling and congestion of the tubes  themselves.  Although you feel brief relief from the bronchodilator inhaler, your asthma may actually be worsening with the tubes becoming more swollen and filled with mucus.  This can delay other crucial treatments, such as oral steroid medications. If you need to use a bronchodilator inhaler daily, several times per day, you should discuss this with your provider.  There are probably better treatments that could be used to keep your asthma under control.     HOME CARE Only take medications as instructed by your medical team. Complete the entire course of an antibiotic. Drink plenty of fluids and get plenty of rest. Avoid close contacts especially the very young and the elderly Cover your mouth if you cough or cough into your sleeve. Always remember to wash your hands A steam or ultrasonic humidifier can help congestion.   GET HELP RIGHT AWAY IF: You develop worsening fever. You become short of breath You cough up blood. Your symptoms persist after you have completed your treatment plan MAKE SURE YOU  Understand these instructions. Will watch your condition. Will get help right away if you are not doing well or get worse.    Thank you for choosing an e-visit.  Your e-visit answers were reviewed by a board certified advanced clinical practitioner to complete your personal care plan. Depending upon the condition, your plan could have included both over the counter or prescription medications.  Please review your pharmacy choice. Make sure the pharmacy is open so you can pick up prescription now. If there is a  problem, you may contact your provider through [employer] and have the prescription routed to another pharmacy.  Your safety is important to [place]. If you have drug allergies check your prescription carefully.   For the next 24 hours you can use MyChart to ask questions about today's visit, request a non-urgent call back, or ask for a work or school excuse. You will get an email  in the next two days asking about your experience. I hope that your e-visit has been valuable and will speed your recovery.

## 2021-10-19 NOTE — Progress Notes (Signed)
I have spent 5 minutes in review of e-visit questionnaire, review and updating patient chart, medical decision making and response to patient.   Belvie Iribe Cody Talah [place], PA-C    

## 2021-10-27 ENCOUNTER — Other Ambulatory Visit: Payer: Self-pay

## 2021-10-27 ENCOUNTER — Ambulatory Visit: Payer: No Typology Code available for payment source | Admitting: Physician Assistant

## 2021-10-27 DIAGNOSIS — F411 Generalized anxiety disorder: Secondary | ICD-10-CM

## 2021-10-27 NOTE — Telephone Encounter (Signed)
Deborah Goodman states she needs a refill on Vitamin D. Is it ok to refill?

## 2021-11-01 ENCOUNTER — Telehealth (INDEPENDENT_AMBULATORY_CARE_PROVIDER_SITE_OTHER): Payer: No Typology Code available for payment source | Admitting: Family Medicine

## 2021-11-01 ENCOUNTER — Encounter: Payer: Self-pay | Admitting: Family Medicine

## 2021-11-01 VITALS — Temp 101.0°F | Wt 210.0 lb

## 2021-11-01 DIAGNOSIS — J40 Bronchitis, not specified as acute or chronic: Secondary | ICD-10-CM

## 2021-11-01 DIAGNOSIS — J329 Chronic sinusitis, unspecified: Secondary | ICD-10-CM | POA: Diagnosis not present

## 2021-11-01 MED ORDER — PREDNISONE 20 MG PO TABS
ORAL_TABLET | ORAL | 0 refills | Status: DC
Start: 2021-11-01 — End: 2021-11-16

## 2021-11-01 MED ORDER — AMOXICILLIN-POT CLAVULANATE 875-125 MG PO TABS: 1.0000 | ORAL_TABLET | Freq: Two times a day (BID) | ORAL | 0 refills | Status: AC

## 2021-11-01 NOTE — Progress Notes (Signed)
Virtual Video Visit via MyChart Note  I connected with  Deborah Goodman on 11/01/21 at  2:20 PM EST by the video enabled telemedicine application for MyChart, and verified that I am speaking with the correct person using two identifiers.   I introduced myself as a Designer, jewellery with the practice. We discussed the limitations of evaluation and management by telemedicine and the availability of in person appointments. The patient expressed understanding and agreed to proceed.  Participating parties in this visit include: The patient and the nurse practitioner listed.  The patient is: At home I am: In the office - [place]  Subjective:    CC:  Chief Complaint  Patient presents with   Cough    Non-productive. Taking Zicam., Vitamin C and fluids   Nasal Congestion    HPI: Deborah Goodman is a 45 y.o. year old female presenting today via MyChart today for cough and congestion.  Patient reports she started feeling horrible towards the end of September.  Initially thought it was a cold however 1 night she woke up with trouble breathing and went to the [facility] emergency department.  States her O2 sats were in the mid 80s she was given breathing treatments and sent home with albuterol, Tessalon Perles, prednisone burst.  States she felt okay for couple days and decided to go on vacation to an amusement park however will within 3 days she felt terrible again.  Reports she had an E-visit with a [facility] PA on 10/19/2021 and was treated with doxycycline for sinusitis.  She was told to continue the albuterol inhaler and Tessalon Perles as needed.  Reports antibiotic is now complete however she is not feeling any better.  States that since symptoms began she has tested negative for flu once and negative for COVID twice (last COVID test was on 10/07/2021).   She is reporting ongoing fever/chills with temperature up to 101 F, rhinorrhea, postnasal drip, sore throat,  shortness of breath, dry cough, bilateral ear pain, chest soreness/stabbing sensation centrally worse with coughing, occasional wheezing.  States she has been using her albuterol inhaler about 4 times a day.  She denies any chest pressure, sinus pressure, nausea, diarrhea.  She did vomit while taking the doxycycline but has not vomited since then.     Past medical history, Surgical history, Family history not pertinant except as noted below, Social history, Allergies, and medications have been entered into the medical record, reviewed, and corrections made.   Review of Systems:  All review of systems negative except what is listed in the HPI   Objective:    General:  Speaking clearly in complete sentences. Mild shortness of breath noted.   Alert and oriented x3.   Normal judgment.  Absent acute distress.   Impression and Recommendations:    1. Sinobronchitis - amoxicillin-clavulanate (AUGMENTIN) 875-125 MG tablet; Take 1 tablet by mouth 2 (two) times daily for 10 days.  Dispense: 20 tablet; Refill: 0 - predniSONE (DELTASONE) 20 MG tablet; Take 2 tablets daily for 3 days, take 1 tablet daily for 3 days, take a half tablet daily 4 days.  Dispense: 11 tablet; Refill: 0  Difficult to determine via virtual visit.  Given the recurrent fevers and sinobronchitis symptoms, we will go ahead and change antibiotic to Augmentin and give a prednisone taper to see if this will better improve her symptoms.  Patient needs to follow-up in person if this is not improving by next week, or go to  the emergency room if symptoms become severe.  Recommend she take another home COVID test.  Patient is aware of signs and symptoms requiring further/urgent evaluation.  Continue supportive measures including rest, hydration, humidifier use, warm liquids with honey and lemon, over-the-counter cough/cold/analgesics including Flonase and Mucinex.  May continue Tessalon Perles and albuterol as needed.      Follow-up if  symptoms worsen or fail to improve.    I discussed the assessment and treatment plan with the patient. The patient was provided an opportunity to ask questions and all were answered. The patient agreed with the plan and demonstrated an understanding of the instructions.   The patient was advised to call back or seek an in-person evaluation if the symptoms worsen or if the condition fails to improve as anticipated.  I spent 20 minutes dedicated to the care of this patient on the date of this encounter to include pre-visit chart review of prior notes and results, face-to-face time with the patient, and post-visit ordering of testing as indicated.   Terrilyn Saver, NP

## 2021-11-01 NOTE — Patient Instructions (Signed)
Over the counter medications that may be helpful for symptoms:  Guaifenesin 1200 mg extended release tabs twice daily, with plenty of water For cough and congestion Brand name: Mucinex   Pseudoephedrine 30 mg, one or two tabs every 4 to 6 hours For sinus congestion Brand name: Sudafed You must get this from the pharmacy counter.  Oxymetazoline nasal spray each morning, one spray in each nostril, for NO MORE THAN 3 days  For nasal and sinus congestion Brand name: Afrin Saline nasal spray or Saline Nasal Irrigation 3-5 times a day For nasal and sinus congestion Brand names: Ocean or AYR Fluticasone nasal spray, one spray in each nostril, each morning (after oxymetazoline and saline, if used) For nasal and sinus congestion Brand name: Flonase Warm salt water gargles  For sore throat Every few hours as needed Alternate ibuprofen 400-600 mg and acetaminophen 1000 mg every 4-6 hours For fever, body aches, headache Brand names: Motrin or Advil and Tylenol Dextromethorphan 12-hour cough version 30 mg every 12 hours  For cough Brand name: Delsym Stop all other cold medications for now (Nyquil, Dayquil, Tylenol Cold, Theraflu, etc) and other non-prescription cough/cold preparations. Many of these have the same ingredients listed above and could cause an overdose of medication.   Herbal treatments that have been shown to be helpful in some patients include: Vitamin C 1000mg  per day Vitamin D 4000iU per day Zinc 100mg  per day Quercetin 25-500mg  twice a day Melatonin 5-10mg  at bedtime  General Instructions Allow your body to rest Drink PLENTY of fluids Cool mist humidifier in the bedroom while sleeping  If you develop severe shortness of breath, uncontrolled fevers, coughing up blood, confusion, chest pain, or signs of dehydration (such as significantly decreased urine amounts or dizziness with standing) please go to the ER.

## 2021-11-09 ENCOUNTER — Ambulatory Visit: Payer: No Typology Code available for payment source | Admitting: Internal Medicine

## 2021-11-09 NOTE — Progress Notes (Deleted)
Cardiology Office Note:    Date:  11/09/2021   ID:  Deborah Goodman, DOB 09-07-1976, MRN [id number]  PCP:  Emeterio Reeve, DO   [facility] Providers Cardiologist:  None { Click to update primary MD,subspecialty MD or APP then REFRESH:1}    Referring MD: Emeterio Reeve, DO   No chief complaint on file. ***  History of Present Illness:    Deborah Goodman is a 45 y.o. female with a hx of HTN, SVT, atypical chest pain seen in the ED at [facility] for atypical non cardiac chest pain, referral for chest pain  She was noted to have URI symptoms in the ED 09/14/2021 with right sided chest pain   Event monitor, turned in?  Past Medical History:  Diagnosis Date   Acid reflux    Avascular necrosis ([place]) 08/2018   Right hip   Complication of anesthesia    needs glide scope because trachea sit to the side not in the middle after SI surgery did not have a voice   Depression    Fatty liver 04/2017   noted on CT Chest   Fibromyalgia    GAD (generalized anxiety disorder) 02/10/2017   Hypertension 02/10/2017   Lung nodule 04/2017   Stable 4 mm nodule in the periphery of the lateral segment , right, noted on CT Chest   Palpitations 02/10/2017   a. 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    Pneumonia 01/2018   Raynaud's syndrome    Splenomegaly 04/2017   noted on CT Chest   SVT (supraventricular tachycardia) (HCC)    Systolic murmur    Tendinopathy of rotator cuff    Right   Tobacco use     Past Surgical History:  Procedure Laterality Date   SACRO-ILIAC PINNING  2016   fusion, in [place] ARTHROSCOPY WITH OPEN ROTATOR CUFF REPAIR AND DISTAL CLAVICLE ACROMINECTOMY Right 07/15/2020   Procedure: SHOULDER ARTHROSCOPY WITH ROTATOR CUFF REPAIR AND DISTAL CLAVICLE [place] AND BICEPS TENDOESIS;  Surgeon: [name], MD;  Location: [facility];  Service: Orthopedics;  Laterality: Right;   TONSILLECTOMY     age 9   TOTAL HIP ARTHROPLASTY  Right 12/05/2018   Procedure: TOTAL HIP ARTHROPLASTY ANTERIOR APPROACH;  Surgeon: Frederik Pear, MD;  Location: [facility];  Service: Orthopedics;  Laterality: Right;    Current Medications: No outpatient medications have been marked as taking for the 11/09/21 encounter (Appointment) with Janina Mayo, MD.     Allergies:   Fish allergy, Shellfish allergy, and Sulfa antibiotics   Social History   Socioeconomic History   Marital status: Single    Spouse name: Not on file   Number of children: Not on file   Years of education: Not on file   Highest education level: Not on file  Occupational History   Occupation: X-ray Engineer, production: [employer]  Tobacco Use   Smoking status: Every Day    Packs/day: 0.50    Years: 22.00    Pack years: 11.00    Types: Cigarettes, E-cigarettes   Smokeless tobacco: Never  Vaping Use   Vaping Use: Former  Substance and Sexual Activity   Alcohol use: Yes    Alcohol/week: 2.0 standard drinks    Types: 1 Glasses of wine, 1 Cans of beer per week    Comment: 3/week   Drug use: No   Sexual activity: Not Currently    Birth control/protection: None  Other Topics Concern  Not on file  Social History Narrative   Not on file   Social Determinants of Health   Financial Resource Strain: Not on file  Food Insecurity: Not on file  Transportation Needs: Not on file  Physical Activity: Not on file  Stress: Not on file  Social Connections: Not on file     Family History: The patient's ***family history includes Arrhythmia in her sister; Cancer in her maternal grandfather and mother; Depression in her mother; Diabetes in her father and paternal grandfather; Hypertension in her mother.  ROS:   Please see the history of present illness.    *** All other systems reviewed and are negative.  EKGs/Labs/Other Studies Reviewed:    The following studies were reviewed today: ***  EKG:  EKG is *** ordered today.  The ekg ordered today demonstrates  ***  Recent Labs: 07/27/2021: ALT 129; BUN 12; Creat 0.61; Hemoglobin 14.1; Platelets 207; Potassium 3.4; Sodium 137; TSH 5.04  Recent Lipid Panel    Component Value Date/Time   CHOL 288 (H) 07/27/2021 0825   TRIG 118 07/27/2021 0825   HDL 99 07/27/2021 0825   CHOLHDL 2.9 07/27/2021 0825   LDLCALC 165 (H) 07/27/2021 0825     Risk Assessment/Calculations:   {Does this patient have ATRIAL FIBRILLATION?:[phone]}       Physical Exam:    VS:  There were no vitals taken for this visit.    Wt Readings from Last 3 Encounters:  11/01/21 210 lb (95.3 kg)  07/27/21 209 lb 1.3 oz (94.8 kg)  10/06/20 197 lb 15.6 oz (89.8 kg)     GEN: *** Well nourished, well developed in no acute distress HEENT: Normal NECK: No JVD; No carotid bruits LYMPHATICS: No lymphadenopathy CARDIAC: ***RRR, no murmurs, rubs, gallops RESPIRATORY:  Clear to auscultation without rales, wheezing or rhonchi  ABDOMEN: Soft, non-tender, non-distended MUSCULOSKELETAL:  No edema; No deformity  SKIN: Warm and dry NEUROLOGIC:  Alert and oriented x 3 PSYCHIATRIC:  Normal affect   ASSESSMENT:    No diagnosis found. PLAN:    In order of problems listed above:  ***      {Are you ordering a CV Procedure (e.g. stress test, cath, DCCV, TEE, etc)?   Press F2        :[id number]}    Medication Adjustments/Labs and Tests Ordered: Current medicines are reviewed at length with the patient today.  Concerns regarding medicines are outlined above.  No orders of the defined types were placed in this encounter.  No orders of the defined types were placed in this encounter.   There are no Patient Instructions on file for this visit.   Signed, Janina Mayo, MD  11/09/2021 7:19 AM    [facility]

## 2021-11-16 ENCOUNTER — Other Ambulatory Visit: Payer: Self-pay

## 2021-11-16 ENCOUNTER — Encounter: Payer: Self-pay | Admitting: Family Medicine

## 2021-11-16 ENCOUNTER — Ambulatory Visit (INDEPENDENT_AMBULATORY_CARE_PROVIDER_SITE_OTHER): Payer: No Typology Code available for payment source | Admitting: Family Medicine

## 2021-11-16 VITALS — BP 157/108 | HR 92 | Temp 97.9°F | Ht 66.0 in | Wt 203.9 lb

## 2021-11-16 DIAGNOSIS — I10 Essential (primary) hypertension: Secondary | ICD-10-CM | POA: Diagnosis not present

## 2021-11-16 DIAGNOSIS — M25511 Pain in right shoulder: Secondary | ICD-10-CM

## 2021-11-16 DIAGNOSIS — F411 Generalized anxiety disorder: Secondary | ICD-10-CM

## 2021-11-16 DIAGNOSIS — G8929 Other chronic pain: Secondary | ICD-10-CM

## 2021-11-16 MED ORDER — VITAMIN D (ERGOCALCIFEROL) 1.25 MG (50000 UNIT) PO CAPS: 50000.0000 [IU] | ORAL_CAPSULE | ORAL | 3 refills | Status: DC

## 2021-11-16 NOTE — Patient Instructions (Signed)
Check blood pressure at home.

## 2021-11-16 NOTE — Progress Notes (Signed)
Subacromial injection   TAMU GOLZ - 45 y.o. female MRN [id number]  Date of birth: 19-Feb-1976  Subjective No chief complaint on file.   HPI Deborah Goodman is a 45 year old female here today for follow-up visit.  She is a former patient of Dr. Sheppard Coil.  Anxiety is managed with sertraline 50 mg daily.  Reports she is doing well with this.  She has not noted any significant side effects.  She is taking metoprolol for history of hypertension and paroxysmal SVT.  Doing well with this.  She does have complaint of right shoulder pain as well as SI joint pain.  She would like to have injection in her right shoulder.  She plans to follow-up with Dr. Dianah Field for SI joint pain.  ROS:  A comprehensive ROS was completed and negative except as noted per HPI  Allergies  Allergen Reactions   Fish Allergy Nausea And Vomiting    ALL SEAFOOD   Shellfish Allergy Nausea And Vomiting    ALL SEAFOOD   Sulfa Antibiotics Other (See Comments)    Thrush reaction Sores in mouth    Past Medical History:  Diagnosis Date   Acid reflux    Avascular necrosis ([place]) 08/2018   Right hip   Complication of anesthesia    needs glide scope because trachea sit to the side not in the middle after SI surgery did not have a voice   Depression    Fatty liver 04/2017   noted on CT Chest   Fibromyalgia    GAD (generalized anxiety disorder) 02/10/2017   Hypertension 02/10/2017   Lung nodule 04/2017   Stable 4 mm nodule in the periphery of the lateral segment , right, noted on CT Chest   Palpitations 02/10/2017   a. 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    Pneumonia 01/2018   Raynaud's syndrome    Splenomegaly 04/2017   noted on CT Chest   SVT (supraventricular tachycardia) (HCC)    Systolic murmur    Tendinopathy of rotator cuff    Right   Tobacco use     Past Surgical History:  Procedure Laterality Date   SACRO-ILIAC PINNING  2016   fusion, in [place]  ARTHROSCOPY WITH OPEN ROTATOR CUFF REPAIR AND DISTAL CLAVICLE ACROMINECTOMY Right 07/15/2020   Procedure: SHOULDER ARTHROSCOPY WITH ROTATOR CUFF REPAIR AND DISTAL CLAVICLE [place] AND BICEPS TENDOESIS;  Surgeon: [name], MD;  Location: [facility];  Service: Orthopedics;  Laterality: Right;   TONSILLECTOMY     age 65   TOTAL HIP ARTHROPLASTY Right 12/05/2018   Procedure: TOTAL HIP ARTHROPLASTY ANTERIOR APPROACH;  Surgeon: Frederik Pear, MD;  Location: [facility];  Service: Orthopedics;  Laterality: Right;    Social History   Socioeconomic History   Marital status: Single    Spouse name: Not on file   Number of children: Not on file   Years of education: Not on file   Highest education level: Not on file  Occupational History   Occupation: X-ray Engineer, production: [employer]  Tobacco Use   Smoking status: Every Day    Packs/day: 0.50    Years: 22.00    Pack years: 11.00    Types: Cigarettes, E-cigarettes   Smokeless tobacco: Never  Vaping Use   Vaping Use: Former  Substance and Sexual Activity   Alcohol use: Yes    Alcohol/week: 2.0 standard drinks    Types: 1 Glasses of wine, 1 Cans  of beer per week    Comment: 3/week   Drug use: No   Sexual activity: Not Currently    Birth control/protection: None  Other Topics Concern   Not on file  Social History Narrative   Not on file   Social Determinants of Health   Financial Resource Strain: Not on file  Food Insecurity: Not on file  Transportation Needs: Not on file  Physical Activity: Not on file  Stress: Not on file  Social Connections: Not on file    Family History  Problem Relation Age of Onset   Depression Mother    Hypertension Mother    Cancer Mother        colon   Diabetes Father    Arrhythmia Sister        palpitations   Cancer Maternal Grandfather        COLON   Diabetes Paternal Grandfather     Health Maintenance  Topic Date Due   HIV Screening  Never done   COVID-19 Vaccine (4 - Booster for  [place] series) 02/01/2021   INFLUENZA VACCINE  03/18/2022 (Originally 07/19/2021)   Pneumococcal Vaccine 45-67 Years old (1 - PCV) 07/27/2022 (Originally 06/13/1983)   PAP SMEAR-Modifier  10/15/2022   TETANUS/TDAP  07/19/2025   Hepatitis C Screening  Completed   HPV VACCINES  Aged Out     ----------------------------------------------------------------------------------------------------------------------------------------------------------------------------------------------------------------- Physical Exam BP (!) 157/108 (BP Location: Left Arm, Patient Position: Sitting, Cuff Size: Large)   Pulse 92   Temp 97.9 F (36.6 C)   Ht 5\' 6"  (1.676 m)   Wt 203 lb 14.4 oz (92.5 kg)   SpO2 100%   BMI 32.91 kg/m   Physical Exam Constitutional:      Appearance: Normal appearance.  HENT:     Head: Normocephalic and atraumatic.  Eyes:     General: No scleral icterus. Cardiovascular:     Rate and Rhythm: Normal rate and regular rhythm.  Pulmonary:     Effort: Pulmonary effort is normal.     Breath sounds: Normal breath sounds.  Musculoskeletal:     Cervical back: Neck supple.     Comments: Shoulder normal to inspection and palpation.  Range of motion is limited in abduction and flexion.  Positive Hawkins sign.  Rotator cuff strength is normal.  Neurological:     General: No focal deficit present.     Mental Status: She is alert.  Psychiatric:        Mood and Affect: Mood normal.        Behavior: Behavior normal.   Procedure note: Procedure discussed with patient including alternatives.  Potential complications reviewed.  Verbal consent given.  Patient prepped in typical sterile fashion with chlorhexidine.  Cold spray applied to skin and right subacromial space was injected with 1 mL of 40 mg/mL Depo-Medrol mixed with 4 mils of 1% lidocaine.  She tolerated procedure well.  Band-Aid applied.  Post procedure instructions  given. ------------------------------------------------------------------------------------------------------------------------------------------------------------------------------------------------------------------- Assessment and Plan  Hypertension Blood pressure is elevated today.  Recommend that she check blood pressure at home.  Continue metoprolol current strength.  Follow-up in 3 to 4 weeks for blood pressure check.  Chronic right shoulder pain Right shoulder injected today.  Tolerated well.  GAD (generalized anxiety disorder) Doing well with sertraline.  Continue at current strength.  First   Meds ordered this encounter  Medications   Vitamin D, Ergocalciferol, (DRISDOL) 1.25 MG (50000 UNIT) CAPS capsule    Sig: Take 1 capsule (50,000 Units total) by mouth  every 7 (seven) days. For 12 weeks    Dispense:  12 capsule    Refill:  3    Return in about 6 months (around 05/16/2022) for HTN.    This visit occurred during the SARS-CoV-2 public health emergency.  Safety protocols were in place, including screening questions prior to the visit, additional usage of staff PPE, and extensive cleaning of exam room while observing appropriate contact time as indicated for disinfecting solutions.

## 2021-11-21 NOTE — Assessment & Plan Note (Signed)
Doing well with sertraline.  Continue at current strength.

## 2021-11-21 NOTE — Assessment & Plan Note (Signed)
Right shoulder injected today.  Tolerated well.

## 2021-11-21 NOTE — Assessment & Plan Note (Signed)
Blood pressure is elevated today.  Recommend that she check blood pressure at home.  Continue metoprolol current strength.  Follow-up in 3 to 4 weeks for blood pressure check.

## 2021-11-25 ENCOUNTER — Other Ambulatory Visit (HOSPITAL_BASED_OUTPATIENT_CLINIC_OR_DEPARTMENT_OTHER): Payer: Self-pay

## 2021-11-25 ENCOUNTER — Other Ambulatory Visit: Payer: Self-pay | Admitting: Family Medicine

## 2021-11-25 ENCOUNTER — Encounter: Payer: Self-pay | Admitting: Family Medicine

## 2021-11-25 MED ORDER — AMLODIPINE BESYLATE 5 MG PO TABS
5.0000 mg | ORAL_TABLET | Freq: Every day | ORAL | 0 refills | Status: DC
  Filled 2021-11-25: qty 90, 90d supply, fill #0

## 2021-11-29 ENCOUNTER — Ambulatory Visit (INDEPENDENT_AMBULATORY_CARE_PROVIDER_SITE_OTHER): Payer: No Typology Code available for payment source

## 2021-11-29 ENCOUNTER — Other Ambulatory Visit: Payer: Self-pay

## 2021-11-29 ENCOUNTER — Other Ambulatory Visit (HOSPITAL_BASED_OUTPATIENT_CLINIC_OR_DEPARTMENT_OTHER): Payer: Self-pay

## 2021-11-29 ENCOUNTER — Ambulatory Visit (INDEPENDENT_AMBULATORY_CARE_PROVIDER_SITE_OTHER): Payer: No Typology Code available for payment source | Admitting: Sports Medicine

## 2021-11-29 ENCOUNTER — Other Ambulatory Visit: Payer: Self-pay | Admitting: Sports Medicine

## 2021-11-29 ENCOUNTER — Encounter: Payer: Self-pay | Admitting: Sports Medicine

## 2021-11-29 DIAGNOSIS — M545 Low back pain, unspecified: Secondary | ICD-10-CM

## 2021-11-29 DIAGNOSIS — M47816 Spondylosis without myelopathy or radiculopathy, lumbar region: Secondary | ICD-10-CM

## 2021-11-29 MED ORDER — MELOXICAM 15 MG PO TABS
15.0000 mg | ORAL_TABLET | Freq: Every day | ORAL | 3 refills | Status: DC
  Filled 2021-11-29: qty 30, 30d supply, fill #0

## 2021-11-29 NOTE — Progress Notes (Signed)
    Procedures performed today:    Procedure:  Injection of right low back trigger points x3 Consent obtained and verified. Time-out conducted. Noted no overlying erythema, induration, or other signs of local infection. Skin prepped in a sterile fashion. Topical analgesic spray: Ethyl chloride. Completed without difficulty. Meds: A total of 1 cc kenalog 40, 4 cc lidocaine used between the 3 trigger points. Advised to call if fevers/chills, erythema, induration, drainage, or persistent bleeding.  Independent interpretation of notes and tests performed by another provider:   None.  Brief History, Exam, Impression, and Recommendations:    Lumbar spondylosis This is a pleasant 45 year old female, chronic low back pain, she has had an SI joint fusion back in 2016. She is having recurrence of pain in the right buttock, worse with sitting, flexion, Valsalva. We discussed the anatomy of this was probably from lumbar disc disease, she is interested in trigger point injections today, I did advise her that this would not likely provide long-term relief as we would not be targeting the pain generator, she really wanted a trigger point injection so we did several trigger points into the right distal erector spinae and glutes. Adding meloxicam, x-rays of her SI joint, lumbar spine, home conditioning for disc disease, return to see me in 6 weeks. MRI if no better.    ___________________________________________ Gwen Her. Dianah Field, M.D., ABFM., CAQSM. Primary Care and [place] Instructor of [place] [facility]

## 2021-11-29 NOTE — Assessment & Plan Note (Addendum)
This is a pleasant 45 year old female, chronic low back pain, she has had an SI joint fusion back in 2016. She is having recurrence of pain in the right buttock, worse with sitting, flexion, Valsalva. We discussed the anatomy of this was probably from lumbar disc disease, she is interested in trigger point injections today, I did advise her that this would not likely provide long-term relief as we would not be targeting the pain generator, she really wanted a trigger point injection so we did several trigger points into the right distal erector spinae and glutes. Adding meloxicam, x-rays of her SI joint, lumbar spine, home conditioning for disc disease, return to see me in 6 weeks. MRI if no better.

## 2021-11-30 ENCOUNTER — Encounter: Payer: Self-pay | Admitting: Sports Medicine

## 2021-11-30 ENCOUNTER — Ambulatory Visit: Payer: No Typology Code available for payment source | Admitting: Medical-Surgical

## 2021-12-21 ENCOUNTER — Encounter: Payer: Self-pay | Admitting: Family Medicine

## 2022-01-04 ENCOUNTER — Other Ambulatory Visit: Payer: Self-pay | Admitting: Osteopathic Medicine

## 2022-01-10 ENCOUNTER — Other Ambulatory Visit: Payer: Self-pay

## 2022-01-10 ENCOUNTER — Ambulatory Visit (INDEPENDENT_AMBULATORY_CARE_PROVIDER_SITE_OTHER): Payer: No Typology Code available for payment source | Admitting: Sports Medicine

## 2022-01-10 DIAGNOSIS — M47816 Spondylosis without myelopathy or radiculopathy, lumbar region: Secondary | ICD-10-CM | POA: Diagnosis not present

## 2022-01-10 NOTE — Progress Notes (Signed)
° ° °  Procedures performed today:    None.  Independent interpretation of notes and tests performed by another provider:   None.  Brief History, Exam, Impression, and Recommendations:    Lumbar spondylosis This is a very pleasant 46 year old female, chronic low back pain, she is post SI joint fusion back in 2016, she is having pain in the right buttock worse with sitting, flexion, Valsalva, we discussed the anatomy of disc disease, x-rays were suggestive of disc disease as well. She was interested in trigger point injection so we tried this at the last visit and she had only partial relief. Due to failure of conservative treatment for greater than 6 weeks with unrevealing x-rays we are going to proceed with MRI of the lumbar spine. Pain is axial and discogenic. Exam is positive for tenderness along the paraspinal musculature and loss of motion. Return to see me to go over MRI results.    ___________________________________________ Gwen Her. Dianah Field, M.D., ABFM., CAQSM. Primary Care and [place] Instructor of [place] [facility]

## 2022-01-10 NOTE — Assessment & Plan Note (Signed)
This is a very pleasant 46 year old female, chronic low back pain, she is post SI joint fusion back in 2016, she is having pain in the right buttock worse with sitting, flexion, Valsalva, we discussed the anatomy of disc disease, x-rays were suggestive of disc disease as well. She was interested in trigger point injection so we tried this at the last visit and she had only partial relief. Due to failure of conservative treatment for greater than 6 weeks with unrevealing x-rays we are going to proceed with MRI of the lumbar spine. Pain is axial and discogenic. Exam is positive for tenderness along the paraspinal musculature and loss of motion. Return to see me to go over MRI results.

## 2022-02-09 ENCOUNTER — Encounter: Payer: Self-pay | Admitting: Family Medicine

## 2022-02-09 DIAGNOSIS — I10 Essential (primary) hypertension: Secondary | ICD-10-CM

## 2022-02-09 DIAGNOSIS — F411 Generalized anxiety disorder: Secondary | ICD-10-CM

## 2022-02-10 ENCOUNTER — Other Ambulatory Visit (HOSPITAL_BASED_OUTPATIENT_CLINIC_OR_DEPARTMENT_OTHER): Payer: Self-pay

## 2022-02-10 MED ORDER — AMLODIPINE BESYLATE 5 MG PO TABS
5.0000 mg | ORAL_TABLET | Freq: Every day | ORAL | 0 refills | Status: DC
  Filled 2022-02-10: qty 90, 90d supply, fill #0

## 2022-02-10 MED ORDER — SERTRALINE HCL 50 MG PO TABS
50.0000 mg | ORAL_TABLET | Freq: Every day | ORAL | 0 refills | Status: DC
  Filled 2022-02-10: qty 90, 90d supply, fill #0

## 2022-02-10 MED ORDER — METOPROLOL SUCCINATE ER 50 MG PO TB24
ORAL_TABLET | ORAL | 0 refills | Status: DC
  Filled 2022-02-10: qty 90, 90d supply, fill #0

## 2022-02-11 ENCOUNTER — Other Ambulatory Visit (HOSPITAL_BASED_OUTPATIENT_CLINIC_OR_DEPARTMENT_OTHER): Payer: Self-pay

## 2022-02-11 MED ORDER — AMLODIPINE BESYLATE 5 MG PO TABS: 5.0000 mg | ORAL_TABLET | Freq: Every day | ORAL | 0 refills | Status: DC

## 2022-02-11 MED ORDER — SERTRALINE HCL 50 MG PO TABS: 50.0000 mg | ORAL_TABLET | Freq: Every day | ORAL | 0 refills | Status: DC

## 2022-02-11 MED ORDER — METOPROLOL SUCCINATE ER 50 MG PO TB24: ORAL_TABLET | ORAL | 0 refills | Status: DC

## 2022-02-25 ENCOUNTER — Telehealth: Payer: Self-pay | Admitting: Family Medicine

## 2022-02-25 NOTE — Telephone Encounter (Signed)
Patient contacted and left paperwork at the front desk for pick up  ?

## 2022-02-25 NOTE — Telephone Encounter (Signed)
Patient dropped off work form to be signed by PCP. Patient stated she really needs form complete before Monday, 02/28/2022. I informed patient that there may be a fee and a 3-5 day turn around - lmr. ?

## 2022-02-25 NOTE — Telephone Encounter (Signed)
Form completed and placed in CMA box.  ?

## 2022-05-17 ENCOUNTER — Ambulatory Visit: Payer: No Typology Code available for payment source | Admitting: Family Medicine

## 2022-06-24 ENCOUNTER — Other Ambulatory Visit: Payer: Self-pay | Admitting: Family Medicine

## 2022-06-24 DIAGNOSIS — F411 Generalized anxiety disorder: Secondary | ICD-10-CM

## 2022-06-24 NOTE — Telephone Encounter (Signed)
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 I let patient know PCP is booking out in August & to call back as soon as she can to get something on the books. AMUCK

## 2022-06-24 NOTE — Telephone Encounter (Signed)
Pls contact pt to schedule HTN follow-up past due since 04/2022.  Sending 60 day medications.

## 2022-07-22 ENCOUNTER — Other Ambulatory Visit: Payer: Self-pay

## 2022-07-22 MED ORDER — ALENDRONATE SODIUM 70 MG PO TABS: 70.0000 mg | ORAL_TABLET | ORAL | 0 refills | Status: DC

## 2022-09-18 ENCOUNTER — Other Ambulatory Visit: Payer: Self-pay | Admitting: Family Medicine

## 2022-09-18 DIAGNOSIS — I10 Essential (primary) hypertension: Secondary | ICD-10-CM

## 2022-09-19 NOTE — Telephone Encounter (Signed)
Called patient and LVM to schedule. Deborah Goodman

## 2022-09-19 NOTE — Telephone Encounter (Signed)
Please contact patient to schedule HTN appt with Dr. Zigmund Daniel.  Last appt in May was cancelled. Unable to provide med refill until appt is scheduled. Thanks

## 2022-09-23 ENCOUNTER — Other Ambulatory Visit: Payer: Self-pay | Admitting: Family Medicine

## 2022-09-23 DIAGNOSIS — F411 Generalized anxiety disorder: Secondary | ICD-10-CM

## 2022-09-23 NOTE — Telephone Encounter (Signed)
Hold refills until 09/26/22 appt. Need labs

## 2022-09-26 ENCOUNTER — Encounter: Payer: Self-pay | Admitting: Family Medicine

## 2022-09-26 ENCOUNTER — Ambulatory Visit: Payer: Self-pay | Admitting: Family Medicine

## 2022-09-26 DIAGNOSIS — Z720 Tobacco use: Secondary | ICD-10-CM

## 2022-09-26 DIAGNOSIS — J209 Acute bronchitis, unspecified: Secondary | ICD-10-CM

## 2022-09-26 DIAGNOSIS — F411 Generalized anxiety disorder: Secondary | ICD-10-CM

## 2022-09-26 DIAGNOSIS — J208 Acute bronchitis due to other specified organisms: Secondary | ICD-10-CM

## 2022-09-26 DIAGNOSIS — I10 Essential (primary) hypertension: Secondary | ICD-10-CM

## 2022-09-26 DIAGNOSIS — I471 Supraventricular tachycardia, unspecified: Secondary | ICD-10-CM

## 2022-09-26 DIAGNOSIS — B9689 Other specified bacterial agents as the cause of diseases classified elsewhere: Secondary | ICD-10-CM

## 2022-09-26 MED ORDER — ALBUTEROL SULFATE HFA 108 (90 BASE) MCG/ACT IN AERS: 2.0000 | INHALATION_SPRAY | Freq: Four times a day (QID) | RESPIRATORY_TRACT | 0 refills | Status: DC | PRN

## 2022-09-26 MED ORDER — METOPROLOL SUCCINATE ER 50 MG PO TB24: ORAL_TABLET | ORAL | 2 refills | Status: DC

## 2022-09-26 MED ORDER — PREDNISONE 50 MG PO TABS: ORAL_TABLET | ORAL | 0 refills | Status: DC

## 2022-09-26 MED ORDER — HYDROXYZINE PAMOATE 25 MG PO CAPS: 25.0000 mg | ORAL_CAPSULE | Freq: Three times a day (TID) | ORAL | 3 refills | Status: AC | PRN

## 2022-09-26 MED ORDER — AMLODIPINE BESYLATE 5 MG PO TABS: 5.0000 mg | ORAL_TABLET | Freq: Every day | ORAL | 2 refills | Status: DC

## 2022-09-26 MED ORDER — SERTRALINE HCL 100 MG PO TABS: 100.0000 mg | ORAL_TABLET | Freq: Every day | ORAL | 2 refills | Status: DC

## 2022-09-26 NOTE — Assessment & Plan Note (Signed)
Consult smoking cessation.  Does not feel that she would be able to quit at this time.

## 2022-09-26 NOTE — Assessment & Plan Note (Signed)
Start prednisone burst 50 mg x 5 days.  Albuterol inhaler added.  She has had some intermittent chest pain as well, EKG without significant changes from previous tracing.

## 2022-09-26 NOTE — Assessment & Plan Note (Addendum)
Increased stress and anxiety.  Increasing Zoloft to 100 mg daily.  Adding Vistaril as needed to help with sleep and breakthrough anxiety.  Additionally, recommend that she see a therapist however she is concerned about cost at this time.

## 2022-09-26 NOTE — Patient Instructions (Addendum)
  Add prednisone.  Use inhaler as needed.  If not improving let me know.   Increase sertraline to '100mg'$  daily.  Use hydroxyzine as needed for breakthrough anxiety.  Look into grief counseling or grief share program.  Cut back on alcohol consumption.  No more than 1-2 drinks per day.  Cut back on smoking with goal to eventually quit.

## 2022-09-26 NOTE — Assessment & Plan Note (Signed)
Blood pressure is a little elevated today.  Currently out of amlodipine.  Prescription reordered.

## 2022-09-26 NOTE — Progress Notes (Signed)
Deborah Goodman - 46 y.o. female MRN [id number]  Date of birth: [birth year]  Subjective Chief Complaint  Patient presents with   Palpitations   Hypertension    HPI Deborah Goodman is a 46 year old female here today for follow-up visit.  She reports that she has been under more stress recently.  Her mother passed away recently.  She is now doing work as a traveling Geologist, engineering, currently in [place], [place].  She is not currently insured.  She continues on sertraline as needed for anxiety and depression.  She is not seeing a counselor recently.  Grief counseling was offered to her through hospice however she declined.  She is interested in adding something to use as needed for breakthrough anxiety.  She has had trouble with sleeping when she gets home in the morning off of third shift.    She has had feeling of shortness of breath over the past couple weeks.  Noticed some wheezing associated with this.  She has had some occasional chest pain.  Pain is sharp in nature.  This does not radiate.  Blood pressure stable at this time.  Continues on amlodipine 5 mg daily as well as metoprolol 50 mg daily.  ROS:  A comprehensive ROS was completed and negative except as noted per HPI   Allergies  Allergen Reactions   Fish Allergy Nausea And Vomiting    ALL SEAFOOD   Shellfish Allergy Nausea And Vomiting    ALL SEAFOOD   Sulfa Antibiotics Other (See Comments)    Thrush reaction Sores in mouth    Past Medical History:  Diagnosis Date   Acid reflux    Avascular necrosis ([place]) 08/2018   Right hip   Complication of anesthesia    needs glide scope because trachea sit to the side not in the middle after SI surgery did not have a voice   Depression    Fatty liver 04/2017   noted on CT Chest   Fibromyalgia    GAD (generalized anxiety disorder) 02/10/2017   Hypertension 02/10/2017   Lung nodule 04/2017   Stable 4 mm nodule in the periphery of the lateral segment , right, noted on CT Chest    Palpitations 02/10/2017   a. 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    Pneumonia 01/2018   Raynaud's syndrome    Splenomegaly 04/2017   noted on CT Chest   SVT (supraventricular tachycardia)    Systolic murmur    Tendinopathy of rotator cuff    Right   Tobacco use     Past Surgical History:  Procedure Laterality Date   SACRO-ILIAC PINNING  2016   fusion, in [place] Right 07/15/2020   Procedure: SHOULDER ARTHROSCOPY WITH ROTATOR CUFF REPAIR AND DISTAL CLAVICLE [place] AND BICEPS TENDOESIS;  Surgeon: [name], MD;  Location: [facility];  Service: Orthopedics;  Laterality: Right;   TONSILLECTOMY     age 73   TOTAL HIP ARTHROPLASTY Right 12/05/2018   Procedure: TOTAL HIP ARTHROPLASTY ANTERIOR APPROACH;  Surgeon: Frederik Pear, MD;  Location: [facility];  Service: Orthopedics;  Laterality: Right;    Social History   Socioeconomic History   Marital status: Single    Spouse name: Not on file   Number of children: Not on file   Years of education: Not on file   Highest education level: Not on file  Occupational History   Occupation: X-ray  tech    Employer: [employer]  Tobacco Use   Smoking status: Every Day    Packs/day: 0.50    Years: 22.00    Total pack years: 11.00    Types: Cigarettes, E-cigarettes   Smokeless tobacco: Never  Vaping Use   Vaping Use: Former  Substance and Sexual Activity   Alcohol use: Not Currently    Alcohol/week: 4.0 - 6.0 standard drinks of alcohol    Types: 4 - 6 Standard drinks or equivalent per week    Comment: 3/week   Drug use: No   Sexual activity: Not Currently    Birth control/protection: None  Other Topics Concern   Not on file  Social History Narrative   Not on file   Social Determinants of Health   Financial Resource Strain: Not on file  Food Insecurity: Not on file  Transportation Needs: Not on file  Physical  Activity: Not on file  Stress: Not on file  Social Connections: Not on file    Family History  Problem Relation Age of Onset   Depression Mother    Hypertension Mother    Cancer Mother        colon   Diabetes Father    Arrhythmia Sister        palpitations   Cancer Maternal Grandfather        COLON   Diabetes Paternal Grandfather     Health Maintenance  Topic Date Due   PAP SMEAR-Modifier  10/15/2022   COVID-19 Vaccine (4 - Pfizer series) 01/19/2023 (Originally 02/01/2021)   INFLUENZA VACCINE  03/19/2023 (Originally 07/19/2022)   HIV Screening  09/27/2023 (Originally 06/12/1992)   TETANUS/TDAP  07/19/2025   COLONOSCOPY (Pts 45-67yr Insurance coverage will need to be confirmed)  08/18/2029   Hepatitis C Screening  Completed   HPV VACCINES  Aged Out     ----------------------------------------------------------------------------------------------------------------------------------------------------------------------------------------------------------------- Physical Exam BP (!) 148/91 (BP Location: Left Arm, Patient Position: Sitting, Cuff Size: Large)   Pulse 98   Ht '5\' 6"'$  (1.676 m)   Wt 206 lb (93.4 kg)   SpO2 96%   BMI 33.25 kg/m   Physical Exam Constitutional:      Appearance: Normal appearance.  Eyes:     General: No scleral icterus. Cardiovascular:     Rate and Rhythm: Normal rate and regular rhythm.  Pulmonary:     Effort: Pulmonary effort is normal.     Breath sounds: Normal breath sounds.  Musculoskeletal:     Cervical back: Neck supple.  Neurological:     General: No focal deficit present.     Mental Status: She is alert.  Psychiatric:        Mood and Affect: Mood normal.        Behavior: Behavior normal.     ------------------------------------------------------------------------------------------------------------------------------------------------------------------------------------------------------------------- Assessment and Plan  Acute  bronchitis Start prednisone burst 50 mg x 5 days.  Albuterol inhaler added.  She has had some intermittent chest pain as well, EKG without significant changes from previous tracing.  PSVT (paroxysmal supraventricular tachycardia) (HCC) Continues on Toprol.  She has had some increased palpitations recently.  Declines Holter monitor at this time.  Hypertension Blood pressure is a little elevated today.  Currently out of amlodipine.  Prescription reordered.  Tobacco use Consult smoking cessation.  Does not feel that she would be able to quit at this time.  GAD (generalized anxiety disorder) Increased stress and anxiety.  Increasing Zoloft to 100 mg daily.  Adding Vistaril as needed to help with sleep and  breakthrough anxiety.  Additionally, recommend that she see a therapist however she is concerned about cost at this time.   Meds ordered this encounter  Medications   amLODipine (NORVASC) 5 MG tablet    Sig: Take 1 tablet (5 mg total) by mouth daily.    Dispense:  90 tablet    Refill:  2   metoprolol succinate (TOPROL-XL) 50 MG 24 hr tablet    Sig: Take 1 tablet by mouth daily with or immediately following a meal.    Dispense:  90 tablet    Refill:  2   sertraline (ZOLOFT) 100 MG tablet    Sig: Take 1 tablet (100 mg total) by mouth daily.    Dispense:  90 tablet    Refill:  2   predniSONE (DELTASONE) 50 MG tablet    Sig: Take 1 tab po daily x5 days.    Dispense:  5 tablet    Refill:  0   albuterol (VENTOLIN HFA) 108 (90 Base) MCG/ACT inhaler    Sig: Inhale 2 puffs into the lungs every 6 (six) hours as needed for wheezing or shortness of breath.    Dispense:  8 g    Refill:  0   hydrOXYzine (VISTARIL) 25 MG capsule    Sig: Take 1 capsule (25 mg total) by mouth 3 (three) times daily as needed for anxiety.    Dispense:  60 capsule    Refill:  3    No follow-ups on file.    This visit occurred during the SARS-CoV-2 public health emergency.  Safety protocols were in place,  including screening questions prior to the visit, additional usage of staff PPE, and extensive cleaning of exam room while observing appropriate contact time as indicated for disinfecting solutions.

## 2022-09-26 NOTE — Assessment & Plan Note (Signed)
Continues on Toprol.  She has had some increased palpitations recently.  Declines Holter monitor at this time.

## 2022-09-27 ENCOUNTER — Encounter: Payer: Self-pay | Admitting: Family Medicine

## 2022-09-27 ENCOUNTER — Other Ambulatory Visit: Payer: Self-pay | Admitting: Family Medicine

## 2022-09-27 MED ORDER — AMOXICILLIN-POT CLAVULANATE 875-125 MG PO TABS: 1.0000 | ORAL_TABLET | Freq: Two times a day (BID) | ORAL | 0 refills | Status: DC

## 2022-09-27 NOTE — Telephone Encounter (Signed)
Augmentin sent.

## 2022-09-29 NOTE — Addendum Note (Signed)
Addended by: Narda Rutherford on: 09/29/2022 02:21 PM   Modules accepted: Orders

## 2022-10-12 ENCOUNTER — Other Ambulatory Visit: Payer: Self-pay | Admitting: Family Medicine

## 2022-10-18 ENCOUNTER — Other Ambulatory Visit: Payer: Self-pay | Admitting: Family Medicine

## 2022-10-18 DIAGNOSIS — B9689 Other specified bacterial agents as the cause of diseases classified elsewhere: Secondary | ICD-10-CM

## 2022-11-07 ENCOUNTER — Telehealth: Payer: Self-pay | Admitting: Family Medicine

## 2022-11-08 ENCOUNTER — Telehealth: Payer: Self-pay | Admitting: Family Medicine

## 2022-11-08 DIAGNOSIS — Z91199 Patient's noncompliance with other medical treatment and regimen due to unspecified reason: Secondary | ICD-10-CM

## 2022-11-08 NOTE — Progress Notes (Signed)
Attempted to contact patient multiple times with no answer.

## 2022-11-08 NOTE — Progress Notes (Signed)
Attempted to contact the patient by phone x 2. VM only

## 2022-11-15 ENCOUNTER — Encounter: Payer: Self-pay | Admitting: Family Medicine

## 2022-11-15 ENCOUNTER — Telehealth (INDEPENDENT_AMBULATORY_CARE_PROVIDER_SITE_OTHER): Payer: Self-pay | Admitting: Family Medicine

## 2022-11-15 VITALS — BP 130/90 | Ht 66.0 in | Wt 206.0 lb

## 2022-11-15 DIAGNOSIS — F411 Generalized anxiety disorder: Secondary | ICD-10-CM

## 2022-11-15 DIAGNOSIS — I10 Essential (primary) hypertension: Secondary | ICD-10-CM

## 2022-11-15 MED ORDER — AMLODIPINE BESYLATE 10 MG PO TABS: 10.0000 mg | ORAL_TABLET | Freq: Every day | ORAL | 0 refills | Status: DC

## 2022-11-15 NOTE — Progress Notes (Signed)
Deborah Goodman - 46 y.o. female MRN [id number]  Date of birth: March 03, 1976     All issues noted in this document were discussed and addressed.  No physical exam was performed (except for noted visual exam findings with Video Visits).  I discussed the limitations of evaluation and management by telemedicine and the availability of in person appointments. The patient expressed understanding and agreed to proceed.  I connected withNAME@ on 11/15/22 at  1:10 PM EST by a video enabled telemedicine application and verified that I am speaking with the correct person using two identifiers.  Present at visit: Luetta Nutting, DO [name]   Patient Location: Home [place] [address]   Provider location:   [facility]  Chief Complaint  Patient presents with   Hypertension    HPI  Deborah Goodman is a 46 y.o. female who presents via audio/video conferencing for a telehealth visit today.    Following up on HTN.  Reports readings are averaging mid 130's/90.  She is taking amlodipine and metoprolol.  Tolerating these well without side effects. She has not had any symptoms related to HTN including chest pain, shortness of breath, palpitations, headache or vision changes.    Sertraline increased to '100mg'$  at previous visit.  She feels that this has helped her anxiety quite a bit. Tolerating increase without side effects.  Has not used vistaril very often.    ROS:  A comprehensive ROS was completed and negative except as noted per HPI  Past Medical History:  Diagnosis Date   Acid reflux    Avascular necrosis ([place]) 08/2018   Right hip   Complication of anesthesia    needs glide scope because trachea sit to the side not in the middle after SI surgery did not have a voice   Depression    Fatty liver 04/2017   noted on CT Chest   Fibromyalgia    GAD (generalized anxiety disorder) 02/10/2017   Hypertension 02/10/2017   Lung nodule 04/2017   Stable 4 mm  nodule in the periphery of the lateral segment , right, noted on CT Chest   Palpitations 02/10/2017   a. 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    Pneumonia 01/2018   Raynaud's syndrome    Splenomegaly 04/2017   noted on CT Chest   SVT (supraventricular tachycardia)    Systolic murmur    Tendinopathy of rotator cuff    Right   Tobacco use     Past Surgical History:  Procedure Laterality Date   SACRO-ILIAC PINNING  2016   fusion, in [place] Right 07/15/2020   Procedure: SHOULDER ARTHROSCOPY WITH ROTATOR CUFF REPAIR AND DISTAL CLAVICLE [place] AND BICEPS TENDOESIS;  Surgeon: [name], MD;  Location: [facility];  Service: Orthopedics;  Laterality: Right;   TONSILLECTOMY     age 83   TOTAL HIP ARTHROPLASTY Right 12/05/2018   Procedure: TOTAL HIP ARTHROPLASTY ANTERIOR APPROACH;  Surgeon: Frederik Pear, MD;  Location: [facility];  Service: Orthopedics;  Laterality: Right;    Family History  Problem Relation Age of Onset   Depression Mother    Hypertension Mother    Cancer Mother        colon   Diabetes Father    Arrhythmia Sister        palpitations   Cancer Maternal Grandfather  COLON   Diabetes Paternal Grandfather     Social History   Socioeconomic History   Marital status: Single    Spouse name: Not on file   Number of children: Not on file   Years of education: Not on file   Highest education level: Not on file  Occupational History   Occupation: X-ray Engineer, production: [employer]  Tobacco Use   Smoking status: Every Day    Packs/day: 0.50    Years: 22.00    Total pack years: 11.00    Types: Cigarettes, E-cigarettes   Smokeless tobacco: Never  Vaping Use   Vaping Use: Former  Substance and Sexual Activity   Alcohol use: Not Currently    Alcohol/week: 4.0 - 6.0 standard drinks of alcohol    Types: 4 - 6 Standard drinks or equivalent per  week    Comment: 3/week   Drug use: No   Sexual activity: Not Currently    Birth control/protection: None  Other Topics Concern   Not on file  Social History Narrative   Not on file   Social Determinants of Health   Financial Resource Strain: Not on file  Food Insecurity: Not on file  Transportation Needs: Not on file  Physical Activity: Not on file  Stress: Not on file  Social Connections: Not on file  Intimate Partner Violence: Not on file     Current Outpatient Medications:    albuterol (VENTOLIN HFA) 108 (90 Base) MCG/ACT inhaler, TAKE 2 PUFFS BY MOUTH EVERY 6 HOURS AS NEEDED FOR WHEEZE OR SHORTNESS OF BREATH, Disp: 8.5 each, Rfl: 1   alendronate (FOSAMAX) 70 MG tablet, TAKE 1 TABLET BY MOUTH ONCE A WEEK. TAKE WITH A FULL GLASS OF WATER ON AN EMPTY STOMACH., Disp: 12 tablet, Rfl: 0   hydrOXYzine (VISTARIL) 25 MG capsule, Take 1 capsule (25 mg total) by mouth 3 (three) times daily as needed for anxiety., Disp: 60 capsule, Rfl: 3   metoprolol succinate (TOPROL-XL) 50 MG 24 hr tablet, Take 1 tablet by mouth daily with or immediately following a meal., Disp: 90 tablet, Rfl: 2   Multiple Vitamins-Minerals (MULTIVITAMIN GUMMIES ADULT PO), Take 2 tablets by mouth daily., Disp: , Rfl:    sertraline (ZOLOFT) 100 MG tablet, Take 1 tablet (100 mg total) by mouth daily., Disp: 90 tablet, Rfl: 2   Vitamin D, Ergocalciferol, (DRISDOL) 1.25 MG (50000 UNIT) CAPS capsule, Take 1 capsule (50,000 Units total) by mouth every 7 (seven) days. For 12 weeks, Disp: 12 capsule, Rfl: 3   amLODipine (NORVASC) 10 MG tablet, Take 1 tablet (10 mg total) by mouth daily., Disp: 90 tablet, Rfl: 0  EXAM:  VITALS per patient if applicable: BP (!) 846/96   Ht '5\' 6"'$  (1.676 m)   Wt 206 lb (93.4 kg)   BMI 33.25 kg/m   GENERAL: alert, oriented, appears well and in no acute distress  HEENT: atraumatic, conjunttiva clear, no obvious abnormalities on inspection of external nose and ears  NECK: normal movements  of the head and neck  LUNGS: on inspection no signs of respiratory distress, breathing rate appears normal, no obvious gross SOB, gasping or wheezing  CV: no obvious cyanosis  MS: moves all visible extremities without noticeable abnormality  PSYCH/NEURO: pleasant and cooperative, no obvious depression or anxiety, speech and thought processing grossly intact  ASSESSMENT AND PLAN:  Discussed the following assessment and plan:  Hypertension Bp remains elevated.  Increase of amlodipine to '10mg'$  sent in.  Continue metoprolol at  current strength.  Monitor BP over the next couple of weeks with new amlodipine strength.  She will send me readings via MyChart.   GAD (generalized anxiety disorder) This has improved with increase in sertraline.  Continue at current strength.       I discussed the assessment and treatment plan with the patient. The patient was provided an opportunity to ask questions and all were answered. The patient agreed with the plan and demonstrated an understanding of the instructions.   The patient was advised to call back or seek an in-person evaluation if the symptoms worsen or if the condition fails to improve as anticipated.    Luetta Nutting, DO

## 2022-11-15 NOTE — Assessment & Plan Note (Signed)
Bp remains elevated.  Increase of amlodipine to '10mg'$  sent in.  Continue metoprolol at current strength.  Monitor BP over the next couple of weeks with new amlodipine strength.  She will send me readings via MyChart.

## 2022-11-15 NOTE — Assessment & Plan Note (Signed)
This has improved with increase in sertraline.  Continue at current strength.

## 2023-03-16 ENCOUNTER — Other Ambulatory Visit: Payer: Self-pay | Admitting: Family Medicine

## 2023-03-22 ENCOUNTER — Other Ambulatory Visit: Payer: Self-pay | Admitting: Family Medicine

## 2023-07-23 ENCOUNTER — Other Ambulatory Visit: Payer: Self-pay | Admitting: Family Medicine

## 2023-07-23 DIAGNOSIS — I10 Essential (primary) hypertension: Secondary | ICD-10-CM

## 2023-08-28 ENCOUNTER — Other Ambulatory Visit: Payer: Self-pay | Admitting: Family Medicine

## 2023-09-07 ENCOUNTER — Other Ambulatory Visit: Payer: Self-pay | Admitting: Family Medicine

## 2023-09-07 NOTE — Telephone Encounter (Signed)
Called patient, LVM

## 2023-09-07 NOTE — Telephone Encounter (Signed)
Pls contact pt to schedule appt for HTN and Mood. Sending 30 day refill. Thanks

## 2023-09-25 ENCOUNTER — Telehealth: Payer: Self-pay | Admitting: Family Medicine

## 2023-09-25 NOTE — Telephone Encounter (Signed)
Patient called she is following up on why her Fosamax 70mg  and Flexeril 10mg  was not filled she did receive her Zoloft 100mg  please advise  [facility] [address] [place]  [place] [address]  [phone]

## 2023-09-25 NOTE — Telephone Encounter (Signed)
Prescription Request  09/25/2023  LOV: 09/26/2022  What is the name of the medication or equipment? sertraline (ZOLOFT) 100 MG tablet, alendronate (FOSAMAX) 70 MG tablet, cyclobenzaprine (FLEXERIL) 10 MG tablet   Have you contacted your pharmacy to request a refill? Yes   Which pharmacy would you like this sent to?   [facility]  [address] [place], [place] [address]  Patient notified that their request is being sent to the clinical staff for review and that they should receive a response within 2 business days.   Please advise at [facility] [phone]

## 2023-09-28 ENCOUNTER — Encounter: Payer: Self-pay | Admitting: Family Medicine

## 2023-09-28 MED ORDER — CYCLOBENZAPRINE HCL 10 MG PO TABS: 5.0000 mg | ORAL_TABLET | Freq: Three times a day (TID) | ORAL | 0 refills | Status: DC | PRN

## 2023-10-20 ENCOUNTER — Other Ambulatory Visit: Payer: Self-pay | Admitting: Family Medicine

## 2023-11-04 ENCOUNTER — Other Ambulatory Visit: Payer: Self-pay | Admitting: Family Medicine

## 2023-11-04 DIAGNOSIS — I10 Essential (primary) hypertension: Secondary | ICD-10-CM

## 2023-11-09 MED ORDER — SERTRALINE HCL 100 MG PO TABS: 100.0000 mg | ORAL_TABLET | Freq: Every day | ORAL | 0 refills | Status: DC

## 2023-11-09 NOTE — Addendum Note (Signed)
Addended by: Ardyth Man on: 11/09/2023 04:28 PM   Modules accepted: Orders

## 2023-12-14 ENCOUNTER — Telehealth: Payer: Self-pay | Admitting: Family Medicine

## 2023-12-14 VITALS — Ht 66.0 in | Wt 206.0 lb

## 2023-12-14 DIAGNOSIS — J40 Bronchitis, not specified as acute or chronic: Secondary | ICD-10-CM

## 2023-12-14 DIAGNOSIS — J329 Chronic sinusitis, unspecified: Secondary | ICD-10-CM

## 2023-12-14 MED ORDER — DOXYCYCLINE HYCLATE 100 MG PO TABS: 100.0000 mg | ORAL_TABLET | Freq: Two times a day (BID) | ORAL | 0 refills | Status: AC

## 2023-12-14 MED ORDER — PREDNISONE 20 MG PO TABS: 20.0000 mg | ORAL_TABLET | Freq: Two times a day (BID) | ORAL | 0 refills | Status: AC

## 2023-12-14 NOTE — Progress Notes (Signed)
Pt states initial illness started 3 weeks ago. She felt better. Took a 10 day cruise.  Upon return, upper respiratory symptoms began. X 1 week today.  Chest congestion, coughing green phlegm, no fever, negative COVID tests x 2

## 2023-12-14 NOTE — Assessment & Plan Note (Signed)
Recommend continued supportive care.  Adding doxycycline and burst of prednisone.  Instructed to contact clinic if symptoms worsen or are not improving.

## 2023-12-14 NOTE — Progress Notes (Signed)
Deborah Goodman - 47 y.o. female MRN [id number]  Date of birth: [birth year]   This visit type was conducted due to national recommendations for restrictions regarding the COVID-19 Pandemic (e.g. social distancing).  This format is felt to be most appropriate for this patient at this time.  All issues noted in this document were discussed and addressed.  No physical exam was performed (except for noted visual exam findings with Video Visits).  I discussed the limitations of evaluation and management by telemedicine and the availability of in person appointments. The patient expressed understanding and agreed to proceed.  I connected withNAME@ on 12/14/23 at  2:10 PM EST by a video enabled telemedicine application and verified that I am speaking with the correct person using two identifiers.  Present at visit: Everrett Coombe, DO Trudee Kuster   Patient Location: Home [address] [place] [address]   Provider location:   [facility]  Chief Complaint  Patient presents with   URI    HPI  [name] is a 47 y.o. female who presents via audio/video conferencing for a telehealth visit today.  Reports that about 3 weeks ago she had URI symptoms.  Improved some prior to recent cruise but had significant worsening symptoms after about 5 days.  Now with increase sinus and chest congestion, cough, thick yellow/green mucus, mild wheezing and sinus pain.  She has not had fever or chills.  Negative COVID x2.  Using OTC meds without much improvement.    ROS:  A comprehensive ROS was completed and negative except as noted per HPI  Past Medical History:  Diagnosis Date   Acid reflux    Avascular necrosis (HCC) 08/2018   Right hip   Complication of anesthesia    needs glide scope because trachea sit to the side not in the middle after SI surgery did not have a voice   Depression    Fatty liver 04/2017   noted on CT Chest   Fibromyalgia    GAD (generalized anxiety  disorder) 02/10/2017   Hypertension 02/10/2017   Lung nodule 04/2017   Stable 4 mm nodule in the periphery of the lateral segment , right, noted on CT Chest   Palpitations 02/10/2017   a. 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    Pneumonia 01/2018   Raynaud's syndrome    Splenomegaly 04/2017   noted on CT Chest   SVT (supraventricular tachycardia) (HCC)    Systolic murmur    Tendinopathy of rotator cuff    Right   Tobacco use     Past Surgical History:  Procedure Laterality Date   SACRO-ILIAC PINNING  2016   fusion, in [place]   SHOULDER ARTHROSCOPY WITH OPEN ROTATOR CUFF REPAIR AND DISTAL CLAVICLE ACROMINECTOMY Right 07/15/2020   Procedure: SHOULDER ARTHROSCOPY WITH ROTATOR CUFF REPAIR AND DISTAL CLAVICLE ACROMINECTOMY AND BICEPS TENDOESIS;  Surgeon: Bjorn Pippin, MD;  Location: [facility];  Service: Orthopedics;  Laterality: Right;   TONSILLECTOMY     age 50   TOTAL HIP ARTHROPLASTY Right 12/05/2018   Procedure: TOTAL HIP ARTHROPLASTY ANTERIOR APPROACH;  Surgeon: Gean Birchwood, MD;  Location: [facility];  Service: Orthopedics;  Laterality: Right;    Family History  Problem Relation Age of Onset   Depression Mother    Hypertension Mother    Cancer Mother        colon   Diabetes Father    Arrhythmia Sister  palpitations   Cancer Maternal Grandfather        COLON   Diabetes Paternal Grandfather     Social History   Socioeconomic History   Marital status: Single    Spouse name: Not on file   Number of children: Not on file   Years of education: Not on file   Highest education level: Associate degree: academic program  Occupational History   Occupation: X-ray Theme park manager:   Tobacco Use   Smoking status: Every Day    Current packs/day: 0.50    Average packs/day: 0.5 packs/day for 22.0 years (11.0 ttl pk-yrs)    Types: Cigarettes, E-cigarettes   Smokeless tobacco: Never  Vaping Use   Vaping status: Former  Substance and  Sexual Activity   Alcohol use: Not Currently    Alcohol/week: 4.0 - 6.0 standard drinks of alcohol    Types: 4 - 6 Standard drinks or equivalent per week    Comment: 3/week   Drug use: No   Sexual activity: Not Currently    Birth control/protection: None  Other Topics Concern   Not on file  Social History Narrative   Not on file   Social Drivers of Health   Financial Resource Strain: Low Risk  (12/14/2023)   Overall Financial Resource Strain (CARDIA)    Difficulty of Paying Living Expenses: Not hard at all  Food Insecurity: No Food Insecurity (12/14/2023)   Hunger Vital Sign    Worried About Running Out of Food in the Last Year: Never true    Ran Out of Food in the Last Year: Never true  Transportation Needs: No Transportation Needs (12/14/2023)   PRAPARE - Administrator, Civil Service (Medical): No    Lack of Transportation (Non-Medical): No  Physical Activity: Insufficiently Active (12/14/2023)   Exercise Vital Sign    Days of Exercise per Week: 2 days    Minutes of Exercise per Session: 20 min  Stress: Stress Concern Present (12/14/2023)   [employer] of Occupational Health - Occupational Stress Questionnaire    Feeling of Stress : To some extent  Social Connections: Socially Isolated (12/14/2023)   Social Connection and Isolation Panel [NHANES]    Frequency of Communication with Friends and Family: Three times a week    Frequency of Social Gatherings with Friends and Family: Twice a week    Attends Religious Services: Never    Database administrator or Organizations: No    Attends Engineer, structural: Not on file    Marital Status: Never married  Intimate Partner Violence: Unknown (03/24/2022)   Received from [employer], [facility]   HITS    Physically Hurt: Not on file    Insult or Talk Down To: Not on file    Threaten Physical Harm: Not on file    Scream or Curse: Not on file     Current Outpatient Medications:    alendronate  (FOSAMAX) 70 MG tablet, TAKE 1 TABLET BY MOUTH ONCE WEEKLY WITH FULL GLASS OF WATER ON EMPTY STOMACH, Disp: 12 tablet, Rfl: 0   amLODipine (NORVASC) 10 MG tablet, TAKE 1 TABLET BY MOUTH EVERY DAY, Disp: 30 tablet, Rfl: 0   cyclobenzaprine (FLEXERIL) 10 MG tablet, Take 0.5-1 tablets (5-10 mg total) by mouth 3 (three) times daily as needed for muscle spasms., Disp: 90 tablet, Rfl: 0   doxycycline (VIBRA-TABS) 100 MG tablet, Take 1 tablet (100 mg total) by mouth 2 (two) times daily for 10  days., Disp: 20 tablet, Rfl: 0   hydrOXYzine (VISTARIL) 25 MG capsule, Take 1 capsule (25 mg total) by mouth 3 (three) times daily as needed for anxiety., Disp: 60 capsule, Rfl: 3   metoprolol succinate (TOPROL-XL) 50 MG 24 hr tablet, TAKE 1 TABLET BY MOUTH DAILY WITH OR IMMEDIATELY FOLLOWING A MEAL., Disp: 30 tablet, Rfl: 0   predniSONE (DELTASONE) 20 MG tablet, Take 1 tablet (20 mg total) by mouth 2 (two) times daily with a meal for 5 days., Disp: 10 tablet, Rfl: 0  EXAM:  VITALS per patient if applicable: Ht 5\' 6"  (1.676 m)   Wt 206 lb (93.4 kg)   BMI 33.25 kg/m   GENERAL: alert, oriented, appears well and in no acute distress  HEENT: atraumatic, conjunttiva clear, no obvious abnormalities on inspection of external nose and ears  NECK: normal movements of the head and neck  LUNGS: on inspection no signs of respiratory distress, breathing rate appears normal, no obvious gross SOB, gasping or wheezing  CV: no obvious cyanosis  MS: moves all visible extremities without noticeable abnormality  PSYCH/NEURO: pleasant and cooperative, no obvious depression or anxiety, speech and thought processing grossly intact  ASSESSMENT AND PLAN:  Discussed the following assessment and plan:  Sinobronchitis Recommend continued supportive care.  Adding doxycycline and burst of prednisone.  Instructed to contact clinic if symptoms worsen or are not improving.      I discussed the assessment and treatment plan with  the patient. The patient was provided an opportunity to ask questions and all were answered. The patient agreed with the plan and demonstrated an understanding of the instructions.   The patient was advised to call back or seek an in-person evaluation if the symptoms worsen or if the condition fails to improve as anticipated.    Everrett Coombe, DO

## 2023-12-18 ENCOUNTER — Other Ambulatory Visit: Payer: Self-pay | Admitting: Family Medicine

## 2023-12-25 ENCOUNTER — Ambulatory Visit (INDEPENDENT_AMBULATORY_CARE_PROVIDER_SITE_OTHER): Payer: BC Managed Care – PPO

## 2023-12-25 ENCOUNTER — Encounter: Payer: Self-pay | Admitting: Emergency Medicine

## 2023-12-25 ENCOUNTER — Ambulatory Visit
Admission: EM | Admit: 2023-12-25 | Discharge: 2023-12-25 | Disposition: A | Discharge status: Home / Self Care | Payer: BC Managed Care – PPO | Attending: Internal Medicine | Admitting: Internal Medicine

## 2023-12-25 ENCOUNTER — Ambulatory Visit: Payer: Self-pay | Admitting: Family Medicine

## 2023-12-25 DIAGNOSIS — J209 Acute bronchitis, unspecified: Secondary | ICD-10-CM | POA: Diagnosis not present

## 2023-12-25 DIAGNOSIS — J189 Pneumonia, unspecified organism: Secondary | ICD-10-CM

## 2023-12-25 DIAGNOSIS — R0602 Shortness of breath: Secondary | ICD-10-CM

## 2023-12-25 DIAGNOSIS — R059 Cough, unspecified: Secondary | ICD-10-CM | POA: Diagnosis not present

## 2023-12-25 MED ORDER — IPRATROPIUM-ALBUTEROL 0.5-2.5 (3) MG/3ML IN SOLN
3.0000 mL | Freq: Once | RESPIRATORY_TRACT | Status: AC
  Administered 2023-12-25: 3 mL via RESPIRATORY_TRACT

## 2023-12-25 MED ORDER — METHYLPREDNISOLONE ACETATE 80 MG/ML IJ SUSP
40.0000 mg | Freq: Once | INTRAMUSCULAR | Status: AC
  Administered 2023-12-25: 40 mg via INTRAMUSCULAR

## 2023-12-25 MED ORDER — AZITHROMYCIN 250 MG PO TABS
ORAL_TABLET | ORAL | 0 refills | Status: DC
Start: 2023-12-25 — End: 2024-03-19

## 2023-12-25 MED ORDER — CEFTRIAXONE SODIUM 1 G IJ SOLR
1.0000 g | Freq: Once | INTRAMUSCULAR | Status: AC
  Administered 2023-12-25: 1 g via INTRAMUSCULAR

## 2023-12-25 MED ORDER — AMOXICILLIN-POT CLAVULANATE 875-125 MG PO TABS: 1.0000 | ORAL_TABLET | Freq: Two times a day (BID) | ORAL | 0 refills | Status: AC

## 2023-12-25 MED ORDER — BENZONATATE 200 MG PO CAPS: 200.0000 mg | ORAL_CAPSULE | Freq: Three times a day (TID) | ORAL | 0 refills | Status: DC | PRN

## 2023-12-25 MED ORDER — ALBUTEROL SULFATE HFA 108 (90 BASE) MCG/ACT IN AERS: 2.0000 | INHALATION_SPRAY | Freq: Four times a day (QID) | RESPIRATORY_TRACT | 0 refills | Status: DC | PRN

## 2023-12-25 NOTE — ED Provider Notes (Signed)
 [facility]  Note:  This document was prepared using Dragon voice recognition software and may include unintentional dictation errors.  MRN: [id number] DOB: Mar 18, 1976 DATE: 12/25/23   Subjective:  Chief Complaint:  Chief Complaint  Patient presents with   Fatigue   Sore Throat   Shortness of Breath    HPI: Deborah Goodman is a 48 y.o. female presenting for cough and shortness of breath for the past month. Patient states she started feeling unwell just prior a cruise she was taking the second week of December. She states she went on the 10 day course and started feeling worse. She stayed in her room and when she got home she continued to worsening. She reports increased cough, fatigue, and feeling short of breath. She states during the time frame she took 3 COVID tests and they were all negative. She had a video visit with her PCP on 12/14/2023 due to prolonged symptoms. At that time, she was prescribed doxycycline  and prednisone . She reports just minor relief with both prescriptions. She has since continued to feel more fatigue and short of breath. She states she started with a sore throat and burning in her ears recently. She has tried honey, salt water, and cough drops with no relief. Patient states that she does smoke currently. Max temperature of 99.2. Denies fever, nausea/vomiting, abdominal pain. Endorses dry cough, sore throat, congestion, otalgia, shortness of breath. Presents NAD.  Prior to Admission medications   Medication Sig Start Date End Date Taking? Authorizing Provider  alendronate  (FOSAMAX ) 70 MG tablet TAKE 1 TABLET BY MOUTH ONCE WEEKLY WITH FULL GLASS OF WATER ON EMPTY STOMACH 12/18/23   Alvia Bring, DO  amLODipine  (NORVASC ) 10 MG tablet TAKE 1 TABLET BY MOUTH EVERY DAY 11/06/23   Alvia Bring, DO  cyclobenzaprine  (FLEXERIL ) 10 MG tablet Take 0.5-1 tablets (5-10 mg total) by mouth 3 (three) times daily as needed for muscle spasms. 09/28/23   Alvia Bring, DO   hydrOXYzine  (VISTARIL ) 25 MG capsule Take 1 capsule (25 mg total) by mouth 3 (three) times daily as needed for anxiety. 09/26/22   Alvia Bring, DO  metoprolol  succinate (TOPROL -XL) 50 MG 24 hr tablet TAKE 1 TABLET BY MOUTH DAILY WITH OR IMMEDIATELY FOLLOWING A MEAL. 11/06/23   Alvia Bring, DO     Allergies  Allergen Reactions   Fish Allergy Nausea And Vomiting    ALL SEAFOOD   Shellfish Allergy Nausea And Vomiting    ALL SEAFOOD   Sulfa Antibiotics Other (See Comments)    Thrush reaction Sores in mouth    History:   Past Medical History:  Diagnosis Date   Acid reflux    Avascular necrosis (HCC) 08/2018   Right hip   Complication of anesthesia    needs glide scope because trachea sit to the side not in the middle after SI surgery did not have a voice   Depression    Fatty liver 04/2017   noted on CT Chest   Fibromyalgia    GAD (generalized anxiety disorder) 02/10/2017   Hypertension 02/10/2017   Lung nodule 04/2017   Stable 4 mm nodule in the periphery of the lateral segment , right, noted on CT Chest   Palpitations 02/10/2017   a. 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    Pneumonia 01/2018   Raynaud's syndrome    Splenomegaly 04/2017   noted on CT Chest   SVT (supraventricular tachycardia) (HCC)    Systolic murmur  Tendinopathy of rotator cuff    Right   Tobacco use      Past Surgical History:  Procedure Laterality Date   SACRO-ILIAC PINNING  2016   fusion, in [place]    SHOULDER ARTHROSCOPY WITH OPEN ROTATOR CUFF REPAIR AND DISTAL CLAVICLE ACROMINECTOMY Right 07/15/2020   Procedure: SHOULDER ARTHROSCOPY WITH ROTATOR CUFF REPAIR AND DISTAL CLAVICLE ACROMINECTOMY AND BICEPS TENDOESIS;  Surgeon: Cristy Bonner DASEN, MD;  Location: [facility];  Service: Orthopedics;  Laterality: Right;   TONSILLECTOMY     age 3   TOTAL HIP ARTHROPLASTY Right 12/05/2018   Procedure: TOTAL HIP ARTHROPLASTY ANTERIOR APPROACH;  Surgeon: Liam Lerner, MD;  Location:  [facility];  Service: Orthopedics;  Laterality: Right;    Family History  Problem Relation Age of Onset   Depression Mother    Hypertension Mother    Cancer Mother        colon   Diabetes Father    Arrhythmia Sister        palpitations   Cancer Maternal Grandfather        COLON   Diabetes Paternal Grandfather     Social History   Tobacco Use   Smoking status: Every Day    Current packs/day: 0.50    Average packs/day: 0.5 packs/day for 22.0 years (11.0 ttl pk-yrs)    Types: Cigarettes, E-cigarettes   Smokeless tobacco: Never  Vaping Use   Vaping status: Former  Substance Use Topics   Alcohol use: Not Currently    Alcohol/week: 4.0 - 6.0 standard drinks of alcohol    Types: 4 - 6 Standard drinks or equivalent per week    Comment: 3/week   Drug use: No    Review of Systems  Constitutional:  Positive for fatigue. Negative for fever.  HENT:  Positive for congestion, ear pain, rhinorrhea and sore throat.   Respiratory:  Positive for cough and shortness of breath.   Gastrointestinal:  Negative for abdominal pain, nausea and vomiting.     Objective:   Vitals: BP 122/80 (BP Location: Right Arm)   Pulse 96   Temp 97.9 F (36.6 C) (Oral)   Resp 18   LMP 12/24/2023   SpO2 95%   Physical Exam Constitutional:      General: She is not in acute distress.    Appearance: Normal appearance. She is well-developed. She is morbidly obese. She is not ill-appearing or toxic-appearing.  HENT:     Head: Normocephalic and atraumatic.     Right Ear: Ear canal normal. A middle ear effusion is present.     Left Ear: Ear canal normal. A middle ear effusion is present. Tympanic membrane is erythematous and bulging.     Nose: Rhinorrhea present. Rhinorrhea is clear.     Mouth/Throat:     Pharynx: Oropharynx is clear. Uvula midline. Posterior oropharyngeal erythema present. No pharyngeal swelling or oropharyngeal exudate.     Tonsils: No tonsillar exudate or tonsillar abscesses.   Cardiovascular:     Rate and Rhythm: Normal rate and regular rhythm.     Heart sounds: Normal heart sounds.  Pulmonary:     Effort: Pulmonary effort is normal.     Breath sounds: Decreased breath sounds present.     Comments: Decreased breath sounds heard throughout. Rhonchi heard anteriorly with heart sounds. Abdominal:     General: Bowel sounds are normal.     Palpations: Abdomen is soft.     Tenderness: There is no abdominal tenderness.  Skin:    General: Skin is  warm and dry.  Neurological:     General: No focal deficit present.     Mental Status: She is alert.  Psychiatric:        Mood and Affect: Mood and affect normal.     Results:  Labs: No results found for this or any previous visit (from the past 24 hours).  Radiology: No results found.   UC Course/Treatments:  Procedures: ED EKG  Date/Time: 12/25/2023 1:28 PM  Performed by: Basilia Ulanda SQUIBB, PA-C Authorized by: Basilia Ulanda SQUIBB, PA-C   Previous ECG:    Previous ECG:  Unavailable Interpretation:    Interpretation: normal   Rate:    ECG rate:  91   ECG rate assessment: normal   Rhythm:    Rhythm: sinus rhythm   Ectopy:    Ectopy: none   QRS:    QRS axis:  Normal   QRS intervals:  Normal   QRS conduction: normal   ST segments:    ST segments:  Non-specific    Medications Ordered in UC: Medications  cefTRIAXone  (ROCEPHIN ) injection 1 g (has no administration in time range)  methylPREDNISolone  acetate (DEPO-MEDROL ) injection 40 mg (has no administration in time range)  ipratropium-albuterol  (DUONEB) 0.5-2.5 (3) MG/3ML nebulizer solution 3 mL (3 mLs Nebulization Given 12/25/23 1235)     Assessment and Plan :     ICD-10-CM   1. Pneumonia of left lower lobe due to infectious organism  J18.9 DG Chest 2 View    DG Chest 2 View    CBC with Differential/Platelet    Comprehensive metabolic panel    CBC with Differential/Platelet    Comprehensive metabolic panel    2. Shortness of breath  R06.02  CANCELED: D-dimer, quantitative    CANCELED: D-dimer, quantitative     Pneumonia of left lower lobe due to infectious organism  Afebrile, nontoxic-appearing, NAD. VSS. DDX includes but not limited to: COVID, flu, bronchitis, pneumonia, CHF, COPD, PE, MI COVID and flu not ordered due to length of symptoms. CXR concerning for LLL infiltrate. Augmentin  875mg  BID was prescribed as well as Zithromax  250mg  as directed given no resolution with the Doxycycline . She was also given Methylprednisolone  40 mg IM and Rocephin  1g IM today in office. Benzonatate  200mg  TID PRN was prescribed for cough and an Albuterol  inhaler 2 puffs every 6 hours PRN for shortness of breath or wheezing. Patient instructed to follow up with her PCP this week for recheck. Strict ED precautions were given and patient verbalized understanding.  Shortness of Breath Afebrile, nontoxic-appearing, NAD. VSS. DDX includes but not limited to: pneumonia, COPD, asthma, PE, MI, CHF CXR was concerning for possible LLL infiltrate. Will treat as such given current symptoms. Patient reports worsening of shortness of breath. Decreased lung sounds on exam, but slight improvement with breathing treatment in office. Differentials include PE and MI. Wells criteria was 1.5 (low risk) and I am unable to perform d-dimer in the outpatient setting. Vital signs stable. EKG showed NSR. HEART score 4 (moderate score) Dicussed with patient discharge to the ED for full work up, but patient stating she wants to try current treatment plan and follow up with her PCP for recheck. If no improvement or worsening symptoms, patient instructed to go directly to the ER. Strict ED precautions were given and patient verbalized understanding.  ED Discharge Orders          Ordered    azithromycin  (ZITHROMAX  Z-PAK) 250 MG tablet        12/25/23 1255  amoxicillin -clavulanate (AUGMENTIN ) 875-125 MG tablet  Every 12 hours        12/25/23 1255    albuterol  (VENTOLIN  HFA) 108  (90 Base) MCG/ACT inhaler  Every 6 hours PRN        12/25/23 1255    benzonatate  (TESSALON ) 200 MG capsule  3 times daily PRN        12/25/23 1255             PDMP not reviewed this encounter.      Shanon Becvar P, PA-C 12/25/23 1329

## 2023-12-25 NOTE — Telephone Encounter (Signed)
  Chief Complaint: uri Symptoms: sore throat, ear pain, states feel like crap, fatigue Frequency: 12/06 Pertinent Negatives: Patient denies fever  Disposition: [] ED /[x] Urgent Care (no appt availability in office) / [x] Appointment(In office/virtual)/ []  [facility]/ [] Home Care/ [] Refused Recommended Disposition /[] [facility]/ []  Follow-up with PCP  Additional Notes: Pt has had temps in 99 range. Feels fatigued. Negative covid tests. Has been dealing with this since 12/6, using numerous OTC medications. Pt was already scheduled for tomorrow, states she will go to UC today.  Leaving appt sched for tomorrow at this time, pt will cancel appt tomorrow depending on how her UC visit goes today.   Copied from CRM [id number]. Topic: Clinical - Red Word Triage >> Dec 25, 2023 11:16 AM Elle L wrote: [facility] that prompted transfer to Nurse Triage: The patient has been off and on sick for a month and had a virtual appointment and she has since finished her prescriptions and is now getting worse and has a low grade fever and is short of breath. Her throat and ears feel like they are on fire. She has had two negative COVID tests. Reason for Disposition  Earache  Answer Assessment - Initial Assessment Questions 1. ONSET: When did the nasal discharge start?      No discharge 3. COUGH: Do you have a cough? If Yes, ask: Describe the color of your sputum (clear, white, yellow, green)     Denies, dry cough 4. RESPIRATORY DISTRESS: Describe your breathing.      Gets winded very easily 5. FEVER: Do you have a fever? If Yes, ask: What is your temperature, how was it measured, and when did it start?     denies 6. SEVERITY: Overall, how bad are you feeling right now? (e.g., doesn't interfere with normal activities, staying home from school/work, staying in bed)      Still working 7. OTHER SYMPTOMS: Do you have any other symptoms? (e.g., sore throat, earache, wheezing,  vomiting)     Sore throat, ear ache,  8. PREGNANCY: Is there any chance you are pregnant? When was your last menstrual period?     denies  Protocols used: Common Cold-A-AH

## 2023-12-25 NOTE — Discharge Instructions (Addendum)
 I have sent several prescriptions to your pharmacy. Firs, I have sent two antibiotics. The first is Augmentin  and the second is Zithromax . They are used together to treat pneumonia and upper respiratory infections. Please take as directed. You also received and antibiotic injection today in office called Rocephin .  I have also sent you an inhaler to use and cough medicine. This will hopefully help you sleep better at night. Please use as directed. You were also given an injection of Methylprednisolone  to help with the cough and congestion.  Your blood work was sent to the lab for further testing. Someone from our office will call you with your results.  I recommend you follow up with your PCP later this week for recheck.   It is very important for you to pay attention to any new symptoms or worsening of your current condition. Please go directly to the Emergency Department immediately should you begin to have any of the following symptoms: shortness of breath, chest pain or difficulty breathing.

## 2023-12-25 NOTE — ED Triage Notes (Signed)
 Felt sick first week of December. Felt better went on 10 day cruise. Then started feeling bad again. Covid test are negative x 3 over the time frame.  Called her PCP 12/26 and was put Doxycyline, and prednisone  20mg  for several days. Reports not feeling any better. Throat, glands, ears are burning, fatigue, extremely winded, esp with exertion. Tried tea with honey, salt water, cough drops, apple cider vinegar.

## 2023-12-26 ENCOUNTER — Ambulatory Visit: Payer: Self-pay | Admitting: Family Medicine

## 2023-12-26 LAB — CBC WITH DIFFERENTIAL/PLATELET
Basophils Absolute: 0.1 10*3/uL (ref 0.0–0.2)
Basos: 1 %
EOS (ABSOLUTE): 0.3 10*3/uL (ref 0.0–0.4)
Eos: 3 %
Hematocrit: 38.8 % (ref 34.0–46.6)
Hemoglobin: 13.5 g/dL (ref 11.1–15.9)
Immature Grans (Abs): 0 10*3/uL (ref 0.0–0.1)
Immature Granulocytes: 0 %
Lymphocytes Absolute: 2.7 10*3/uL (ref 0.7–3.1)
Lymphs: 25 %
MCH: 35.5 pg — ABNORMAL HIGH (ref 26.6–33.0)
MCHC: 34.8 g/dL (ref 31.5–35.7)
MCV: 102 fL — ABNORMAL HIGH (ref 79–97)
Monocytes Absolute: 0.7 10*3/uL (ref 0.1–0.9)
Monocytes: 7 %
Neutrophils Absolute: 6.9 10*3/uL (ref 1.4–7.0)
Neutrophils: 64 %
Platelets: 203 10*3/uL (ref 150–450)
RBC: 3.8 x10E6/uL (ref 3.77–5.28)
RDW: 12 % (ref 11.7–15.4)
WBC: 10.6 10*3/uL (ref 3.4–10.8)

## 2023-12-26 LAB — COMPREHENSIVE METABOLIC PANEL
ALT: 131 [IU]/L — ABNORMAL HIGH (ref 0–32)
AST: 205 [IU]/L — ABNORMAL HIGH (ref 0–40)
Albumin: 4.5 g/dL (ref 3.9–4.9)
Alkaline Phosphatase: 97 [IU]/L (ref 44–121)
BUN/Creatinine Ratio: 13 (ref 9–23)
BUN: 7 mg/dL (ref 6–24)
Bilirubin Total: 0.8 mg/dL (ref 0.0–1.2)
CO2: 21 mmol/L (ref 20–29)
Calcium: 8.7 mg/dL (ref 8.7–10.2)
Chloride: 94 mmol/L — ABNORMAL LOW (ref 96–106)
Creatinine, Ser: 0.52 mg/dL — ABNORMAL LOW (ref 0.57–1.00)
Globulin, Total: 2.7 g/dL (ref 1.5–4.5)
Glucose: 111 mg/dL — ABNORMAL HIGH (ref 70–99)
Potassium: 3 mmol/L — ABNORMAL LOW (ref 3.5–5.2)
Sodium: 136 mmol/L (ref 134–144)
Total Protein: 7.2 g/dL (ref 6.0–8.5)
eGFR: 116 mL/min/{1.73_m2} (ref >59)

## 2023-12-28 ENCOUNTER — Telehealth: Payer: Self-pay | Admitting: Internal Medicine

## 2023-12-28 MED ORDER — POTASSIUM CHLORIDE ER 20 MEQ PO TBCR: 20.0000 meq | EXTENDED_RELEASE_TABLET | Freq: Two times a day (BID) | ORAL | 0 refills | Status: DC

## 2023-12-28 NOTE — Telephone Encounter (Signed)
 Patient's potassium was 3.0. RX sent for potassium. If worsening symptoms, patient instructed to go directly to the ER.

## 2024-02-11 DIAGNOSIS — I10 Essential (primary) hypertension: Secondary | ICD-10-CM | POA: Diagnosis not present

## 2024-02-11 DIAGNOSIS — R040 Epistaxis: Secondary | ICD-10-CM | POA: Diagnosis not present

## 2024-02-11 DIAGNOSIS — F419 Anxiety disorder, unspecified: Secondary | ICD-10-CM | POA: Diagnosis not present

## 2024-02-11 DIAGNOSIS — F17210 Nicotine dependence, cigarettes, uncomplicated: Secondary | ICD-10-CM | POA: Diagnosis not present

## 2024-02-11 DIAGNOSIS — I471 Supraventricular tachycardia, unspecified: Secondary | ICD-10-CM | POA: Diagnosis not present

## 2024-02-12 ENCOUNTER — Other Ambulatory Visit: Payer: Self-pay | Admitting: Family Medicine

## 2024-02-12 DIAGNOSIS — I10 Essential (primary) hypertension: Secondary | ICD-10-CM

## 2024-02-12 NOTE — Telephone Encounter (Signed)
 Last Fill: 11/06/23  Last OV: 12/14/23 Next OV: 02/22/24   Routing to provider for review/authorization.

## 2024-02-12 NOTE — Telephone Encounter (Unsigned)
 Copied from CRM [id number]. Topic: Clinical - Medication Refill >> Feb 12, 2024  9:32 AM Nila Nephew wrote: Most Recent Primary Care Visit:  Provider: Everrett Coombe  Department: [facility]  Visit Type: MYCHART VIDEO VISIT  Date: 12/14/2023  Medication: metoprolol succinate (TOPROL-XL) 50 MG 24 hr tablet  Has the patient contacted their pharmacy? No (Agent: If no, request that the patient contact the pharmacy for the refill. If patient does not wish to contact the pharmacy document the reason why and proceed with request.) (Agent: If yes, when and what did the pharmacy advise?)  Is this the correct pharmacy for this prescription? Yes If no, delete pharmacy and type the correct one.  This is the patient's preferred pharmacy:  [facility] [phone] [facility] - [place], [place] - [address] [place] [address] Phone: [phone] Fax: [phone]    Has the prescription been filled recently? No  Is the patient out of the medication? Yes  Has the patient been seen for an appointment in the last year OR does the patient have an upcoming appointment? Yes  Can we respond through MyChart? Yes  Agent: Please be advised that Rx refills may take up to 3 business days. We ask that you follow-up with your pharmacy.

## 2024-02-14 MED ORDER — METOPROLOL SUCCINATE ER 50 MG PO TB24: ORAL_TABLET | ORAL | 0 refills | Status: DC

## 2024-02-22 ENCOUNTER — Ambulatory Visit (INDEPENDENT_AMBULATORY_CARE_PROVIDER_SITE_OTHER): Payer: BC Managed Care – PPO | Admitting: Family Medicine

## 2024-02-22 ENCOUNTER — Encounter: Payer: Self-pay | Admitting: Family Medicine

## 2024-02-22 VITALS — BP 103/69 | HR 75 | Ht 66.0 in | Wt 225.0 lb

## 2024-02-22 DIAGNOSIS — Z0000 Encounter for general adult medical examination without abnormal findings: Secondary | ICD-10-CM | POA: Insufficient documentation

## 2024-02-22 DIAGNOSIS — R7989 Other specified abnormal findings of blood chemistry: Secondary | ICD-10-CM

## 2024-02-22 DIAGNOSIS — Z124 Encounter for screening for malignant neoplasm of cervix: Secondary | ICD-10-CM

## 2024-02-22 DIAGNOSIS — I10 Essential (primary) hypertension: Secondary | ICD-10-CM | POA: Diagnosis not present

## 2024-02-22 DIAGNOSIS — Z13220 Encounter for screening for lipoid disorders: Secondary | ICD-10-CM

## 2024-02-22 MED ORDER — METOPROLOL SUCCINATE ER 50 MG PO TB24: ORAL_TABLET | ORAL | 1 refills | Status: DC

## 2024-02-22 MED ORDER — AMLODIPINE BESYLATE 10 MG PO TABS: 10.0000 mg | ORAL_TABLET | Freq: Every day | ORAL | 1 refills | Status: DC

## 2024-02-22 NOTE — Patient Instructions (Signed)
 Preventive Care 16-47 Years Old, Female  Preventive care refers to lifestyle choices and visits with your health care provider that can promote health and wellness. Preventive care visits are also called wellness exams.  What can I expect for my preventive care visit?  Counseling  Your health care provider may ask you questions about your:  Medical history, including:  Past medical problems.  Family medical history.  Pregnancy history.  Current health, including:  Menstrual cycle.  Method of birth control.  Emotional well-being.  Home life and relationship well-being.  Sexual activity and sexual health.  Lifestyle, including:  Alcohol, nicotine or tobacco, and drug use.  Access to firearms.  Diet, exercise, and sleep habits.  Work and work Astronomer.  Sunscreen use.  Safety issues such as seatbelt and bike helmet use.  Physical exam  Your health care provider will check your:  Height and weight. These may be used to calculate your BMI (body mass index). BMI is a measurement that tells if you are at a healthy weight.  Waist circumference. This measures the distance around your waistline. This measurement also tells if you are at a healthy weight and may help predict your risk of certain diseases, such as type 2 diabetes and high blood pressure.  Heart rate and blood pressure.  Body temperature.  Skin for abnormal spots.  What immunizations do I need?    Vaccines are usually given at various ages, according to a schedule. Your health care provider will recommend vaccines for you based on your age, medical history, and lifestyle or other factors, such as travel or where you work.  What tests do I need?  Screening  Your health care provider may recommend screening tests for certain conditions. This may include:  Lipid and cholesterol levels.  Diabetes screening. This is done by checking your blood sugar (glucose) after you have not eaten for a while (fasting).  Pelvic exam and Pap test.  Hepatitis B test.  Hepatitis C  test.  HIV (human immunodeficiency virus) test.  STI (sexually transmitted infection) testing, if you are at risk.  Lung cancer screening.  Colorectal cancer screening.  Mammogram. Talk with your health care provider about when you should start having regular mammograms. This may depend on whether you have a family history of breast cancer.  BRCA-related cancer screening. This may be done if you have a family history of breast, ovarian, tubal, or peritoneal cancers.  Bone density scan. This is done to screen for osteoporosis.  Talk with your health care provider about your test results, treatment options, and if necessary, the need for more tests.  Follow these instructions at home:  Eating and drinking    Eat a diet that includes fresh fruits and vegetables, whole grains, lean protein, and low-fat dairy products.  Take vitamin and mineral supplements as recommended by your health care provider.  Do not drink alcohol if:  Your health care provider tells you not to drink.  You are pregnant, may be pregnant, or are planning to become pregnant.  If you drink alcohol:  Limit how much you have to 0-1 drink a day.  Know how much alcohol is in your drink. In the [place] one drink equals one 12 oz bottle of beer (355 mL), one 5 oz glass of wine (148 mL), or one 1 oz glass of hard liquor (44 mL).  Lifestyle  Brush your teeth every morning and night with fluoride toothpaste. Floss one time each day.  Exercise for at least  30 minutes 5 or more days each week.  Do not use any products that contain nicotine or tobacco. These products include cigarettes, chewing tobacco, and vaping devices, such as e-cigarettes. If you need help quitting, ask your health care provider.  Do not use drugs.  If you are sexually active, practice safe sex. Use a condom or other form of protection to prevent STIs.  If you do not wish to become pregnant, use a form of birth control. If you plan to become pregnant, see your health care provider for a  prepregnancy visit.  Take aspirin only as told by your health care provider. Make sure that you understand how much to take and what form to take. Work with your health care provider to find out whether it is safe and beneficial for you to take aspirin daily.  Find healthy ways to manage stress, such as:  Meditation, yoga, or listening to music.  Journaling.  Talking to a trusted person.  Spending time with friends and family.  Minimize exposure to UV radiation to reduce your risk of skin cancer.  Safety  Always wear your seat belt while driving or riding in a vehicle.  Do not drive:  If you have been drinking alcohol. Do not ride with someone who has been drinking.  When you are tired or distracted.  While texting.  If you have been using any mind-altering substances or drugs.  Wear a helmet and other protective equipment during sports activities.  If you have firearms in your house, make sure you follow all gun safety procedures.  Seek help if you have been physically or sexually abused.  What's next?  Visit your health care provider once a year for an annual wellness visit.  Ask your health care provider how often you should have your eyes and teeth checked.  Stay up to date on all vaccines.  This information is not intended to replace advice given to you by your health care provider. Make sure you discuss any questions you have with your health care provider.  Document Revised: 06/02/2021 Document Reviewed: 06/02/2021  Elsevier Patient Education  2024 [employer].

## 2024-02-22 NOTE — Assessment & Plan Note (Signed)
 Well adult Orders Placed This Encounter  Procedures   CBC with Differential/Platelet   CMP14+EGFR   Lipid Panel With LDL/HDL Ratio   TSH   HgB Z6X   Ambulatory referral to Obstetrics / Gynecology    Referral Priority:   Routine    Referral Type:   Consultation    Referral Reason:   Specialty Services Required    Requested Specialty:   Obstetrics and Gynecology    Number of Visits Requested:   1  Screenings: per lab orders Immunizations: Pneumonia deferred to next visit. Anticipatory guidance/risk factor reduction: Recommendations per AVS

## 2024-02-22 NOTE — Progress Notes (Signed)
 Deborah Goodman - 48 y.o. female MRN [id number]  Date of birth: 04-24-1976  Subjective Chief Complaint  Patient presents with   Medical Management of Chronic Issues    HPI Deborah Goodman is a 48 y.o. female here today for annual exam.   She reports he is doing pretty well at this time.  Needs refills on chronic meds.  She is not very active and feels like her diet could be better.  She does continue to smoke, not ready to quit at this time.  Occasional alcohol throughout the week.  She does need an updated referral to GYN for cervical cancer screening.  She is due for colon cancer screening and August.  Review of Systems  Constitutional:  Negative for chills, fever, malaise/fatigue and weight loss.  HENT:  Negative for congestion, ear pain and sore throat.   Eyes:  Negative for blurred vision, double vision and pain.  Respiratory:  Negative for cough and shortness of breath.   Cardiovascular:  Negative for chest pain and palpitations.  Gastrointestinal:  Negative for abdominal pain, blood in stool, constipation, heartburn and nausea.  Genitourinary:  Negative for dysuria and urgency.  Musculoskeletal:  Negative for joint pain and myalgias.  Neurological:  Negative for dizziness and headaches.  Endo/Heme/Allergies:  Does not bruise/bleed easily.  Psychiatric/Behavioral:  Negative for depression. The patient is not nervous/anxious and does not have insomnia.      Allergies  Allergen Reactions   Fish Allergy Nausea And Vomiting    ALL SEAFOOD   Shellfish Allergy Nausea And Vomiting    ALL SEAFOOD   Sulfa Antibiotics Other (See Comments)    Thrush reaction Sores in mouth    Past Medical History:  Diagnosis Date   Acid reflux    Avascular necrosis (HCC) 08/2018   Right hip   Complication of anesthesia    needs glide scope because trachea sit to the side not in the middle after SI surgery did not have a voice   Depression    Fatty liver 04/2017   noted on CT Chest    Fibromyalgia    GAD (generalized anxiety disorder) 02/10/2017   Hypertension 02/10/2017   Lung nodule 04/2017   Stable 4 mm nodule in the periphery of the lateral segment , right, noted on CT Chest   Palpitations 02/10/2017   a. 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    Pneumonia 01/2018   Raynaud's syndrome    Splenomegaly 04/2017   noted on CT Chest   SVT (supraventricular tachycardia) (HCC)    Systolic murmur    Tendinopathy of rotator cuff    Right   Tobacco use     Past Surgical History:  Procedure Laterality Date   SACRO-ILIAC PINNING  2016   fusion, in [place]   SHOULDER ARTHROSCOPY WITH OPEN ROTATOR CUFF REPAIR AND DISTAL CLAVICLE ACROMINECTOMY Right 07/15/2020   Procedure: SHOULDER ARTHROSCOPY WITH ROTATOR CUFF REPAIR AND DISTAL CLAVICLE ACROMINECTOMY AND BICEPS TENDOESIS;  Surgeon: Bjorn Pippin, MD;  Location: [facility];  Service: Orthopedics;  Laterality: Right;   TONSILLECTOMY     age 44   TOTAL HIP ARTHROPLASTY Right 12/05/2018   Procedure: TOTAL HIP ARTHROPLASTY ANTERIOR APPROACH;  Surgeon: Gean Birchwood, MD;  Location: [facility];  Service: Orthopedics;  Laterality: Right;    Social History   Socioeconomic History   Marital status: Single    Spouse name: Not on file   Number of children: Not on file  Years of education: Not on file   Highest education level: Associate degree: academic program  Occupational History   Occupation: X-ray Theme park manager: [employer]  Tobacco Use   Smoking status: Every Day    Current packs/day: 0.50    Average packs/day: 0.5 packs/day for 22.0 years (11.0 ttl pk-yrs)    Types: Cigarettes, E-cigarettes   Smokeless tobacco: Never  Vaping Use   Vaping status: Former  Substance and Sexual Activity   Alcohol use: Not Currently    Alcohol/week: 4.0 - 6.0 standard drinks of alcohol    Types: 4 - 6 Standard drinks or equivalent per week    Comment: 3/week   Drug use: No   Sexual activity: Not Currently     Birth control/protection: None  Other Topics Concern   Not on file  Social History Narrative   Not on file   Social Drivers of Health   Financial Resource Strain: Low Risk  (12/14/2023)   Overall Financial Resource Strain (CARDIA)    Difficulty of Paying Living Expenses: Not hard at all  Food Insecurity: No Food Insecurity (12/14/2023)   Hunger Vital Sign    Worried About Running Out of Food in the Last Year: Never true    Ran Out of Food in the Last Year: Never true  Transportation Needs: No Transportation Needs (12/14/2023)   PRAPARE - Administrator, Civil Service (Medical): No    Lack of Transportation (Non-Medical): No  Physical Activity: Insufficiently Active (12/14/2023)   Exercise Vital Sign    Days of Exercise per Week: 2 days    Minutes of Exercise per Session: 20 min  Stress: Stress Concern Present (12/14/2023)   [employer] of Occupational Health - Occupational Stress Questionnaire    Feeling of Stress : To some extent  Social Connections: Socially Isolated (12/14/2023)   Social Connection and Isolation Panel [NHANES]    Frequency of Communication with Friends and Family: Three times a week    Frequency of Social Gatherings with Friends and Family: Twice a week    Attends Religious Services: Never    Database administrator or Organizations: No    Attends Engineer, structural: Not on file    Marital Status: Never married    Family History  Problem Relation Age of Onset   Depression Mother    Hypertension Mother    Cancer Mother        colon   Diabetes Father    Arrhythmia Sister        palpitations   Cancer Maternal Grandfather        COLON   Diabetes Paternal Grandfather     Health Maintenance  Topic Date Due   Pneumococcal Vaccine 61-27 Years old (1 of 2 - PCV) Never done   HIV Screening  Never done   COVID-19 Vaccine (4 - 2024-25 season) 08/20/2023   INFLUENZA VACCINE  03/18/2024 (Originally 07/20/2023)   Colonoscopy   08/18/2024   Cervical Cancer Screening (HPV/Pap Cotest)  10/15/2024   DTaP/Tdap/Td (2 - Td or Tdap) 07/19/2025   Hepatitis C Screening  Completed   HPV VACCINES  Aged Out     ----------------------------------------------------------------------------------------------------------------------------------------------------------------------------------------------------------------- Physical Exam BP 103/69 (BP Location: Left Arm, Patient Position: Sitting, Cuff Size: Large)   Pulse 75   Ht 5\' 6"  (1.676 m)   Wt 225 lb (102.1 kg)   SpO2 97%   BMI 36.32 kg/m   Physical Exam Constitutional:      General:  She is not in acute distress. HENT:     Head: Normocephalic and atraumatic.     Right Ear: Tympanic membrane and ear canal normal.     Left Ear: Tympanic membrane and ear canal normal.     Nose: Nose normal.  Eyes:     General: No scleral icterus.    Conjunctiva/sclera: Conjunctivae normal.  Neck:     Thyroid: No thyromegaly.  Cardiovascular:     Rate and Rhythm: Normal rate and regular rhythm.     Heart sounds: Normal heart sounds.  Pulmonary:     Effort: Pulmonary effort is normal.     Breath sounds: Normal breath sounds.  Abdominal:     General: Bowel sounds are normal. There is no distension.     Palpations: Abdomen is soft.     Tenderness: There is no abdominal tenderness. There is no guarding.  Musculoskeletal:        General: Normal range of motion.     Cervical back: Normal range of motion and neck supple.  Lymphadenopathy:     Cervical: No cervical adenopathy.  Skin:    General: Skin is warm and dry.     Findings: No rash.  Neurological:     General: No focal deficit present.     Mental Status: She is alert and oriented to person, place, and time.     Cranial Nerves: No cranial nerve deficit.     Coordination: Coordination normal.  Psychiatric:        Mood and Affect: Mood normal.        Behavior: Behavior normal.      ------------------------------------------------------------------------------------------------------------------------------------------------------------------------------------------------------------------- Assessment and Plan  Well adult exam Well adult Orders Placed This Encounter  Procedures   CBC with Differential/Platelet   CMP14+EGFR   Lipid Panel With LDL/HDL Ratio   TSH   HgB Y4I   Ambulatory referral to Obstetrics / Gynecology    Referral Priority:   Routine    Referral Type:   Consultation    Referral Reason:   Specialty Services Required    Requested Specialty:   Obstetrics and Gynecology    Number of Visits Requested:   1  Screenings: per lab orders Immunizations: Pneumonia deferred to next visit. Anticipatory guidance/risk factor reduction: Recommendations per AVS   Meds ordered this encounter  Medications   amLODipine (NORVASC) 10 MG tablet    Sig: Take 1 tablet (10 mg total) by mouth daily.    Dispense:  90 tablet    Refill:  1   metoprolol succinate (TOPROL-XL) 50 MG 24 hr tablet    Sig: Take 1 tablet by mouth daily with or immediately following a meal. No refills. Overdue for a visit.    Dispense:  90 tablet    Refill:  1    No follow-ups on file.    This visit occurred during the SARS-CoV-2 public health emergency.  Safety protocols were in place, including screening questions prior to the visit, additional usage of staff PPE, and extensive cleaning of exam room while observing appropriate contact time as indicated for disinfecting solutions.

## 2024-02-23 ENCOUNTER — Encounter: Payer: Self-pay | Admitting: Family Medicine

## 2024-02-23 LAB — CBC WITH DIFFERENTIAL/PLATELET
Basophils Absolute: 0.1 10*3/uL (ref 0.0–0.2)
Basos: 1 %
EOS (ABSOLUTE): 0.3 10*3/uL (ref 0.0–0.4)
Eos: 4 %
Hematocrit: 42.9 % (ref 34.0–46.6)
Hemoglobin: 14.7 g/dL (ref 11.1–15.9)
Immature Grans (Abs): 0 10*3/uL (ref 0.0–0.1)
Immature Granulocytes: 0 %
Lymphocytes Absolute: 2.6 10*3/uL (ref 0.7–3.1)
Lymphs: 30 %
MCH: 33.9 pg — ABNORMAL HIGH (ref 26.6–33.0)
MCHC: 34.3 g/dL (ref 31.5–35.7)
MCV: 99 fL — ABNORMAL HIGH (ref 79–97)
Monocytes Absolute: 0.5 10*3/uL (ref 0.1–0.9)
Monocytes: 5 %
Neutrophils Absolute: 5.2 10*3/uL (ref 1.4–7.0)
Neutrophils: 60 %
Platelets: 351 10*3/uL (ref 150–450)
RBC: 4.34 x10E6/uL (ref 3.77–5.28)
RDW: 12 % (ref 11.7–15.4)
WBC: 8.7 10*3/uL (ref 3.4–10.8)

## 2024-02-23 LAB — CMP14+EGFR
ALT: 57 IU/L — ABNORMAL HIGH (ref 0–32)
AST: 68 IU/L — ABNORMAL HIGH (ref 0–40)
Albumin: 4.2 g/dL (ref 3.9–4.9)
Alkaline Phosphatase: 117 IU/L (ref 44–121)
BUN/Creatinine Ratio: 7 — ABNORMAL LOW (ref 9–23)
BUN: 4 mg/dL — ABNORMAL LOW (ref 6–24)
Bilirubin Total: 0.8 mg/dL (ref 0.0–1.2)
CO2: 27 mmol/L (ref 20–29)
Calcium: 9.2 mg/dL (ref 8.7–10.2)
Chloride: 99 mmol/L (ref 96–106)
Creatinine, Ser: 0.59 mg/dL (ref 0.57–1.00)
Globulin, Total: 3.1 g/dL (ref 1.5–4.5)
Glucose: 100 mg/dL — ABNORMAL HIGH (ref 70–99)
Potassium: 3.7 mmol/L (ref 3.5–5.2)
Sodium: 144 mmol/L (ref 134–144)
Total Protein: 7.3 g/dL (ref 6.0–8.5)
eGFR: 112 mL/min/{1.73_m2} (ref >59)

## 2024-02-23 LAB — LIPID PANEL WITH LDL/HDL RATIO
Cholesterol, Total: 263 mg/dL — ABNORMAL HIGH (ref 100–199)
HDL: 36 mg/dL — ABNORMAL LOW (ref >39)
LDL Chol Calc (NIH): 175 mg/dL — ABNORMAL HIGH (ref 0–99)
LDL/HDL Ratio: 4.9 ratio — ABNORMAL HIGH (ref 0.0–3.2)
Triglycerides: 274 mg/dL — ABNORMAL HIGH (ref 0–149)
VLDL Cholesterol Cal: 52 mg/dL — ABNORMAL HIGH (ref 5–40)

## 2024-02-23 LAB — HEMOGLOBIN A1C
Est. average glucose Bld gHb Est-mCnc: 97 mg/dL
Hgb A1c MFr Bld: 5 % (ref 4.8–5.6)

## 2024-02-23 LAB — TSH: TSH: 3.71 u[IU]/mL (ref 0.450–4.500)

## 2024-02-23 MED ORDER — MUPIROCIN 2 % EX OINT: 1.0000 | TOPICAL_OINTMENT | Freq: Two times a day (BID) | CUTANEOUS | 0 refills | Status: DC

## 2024-02-25 ENCOUNTER — Encounter: Payer: Self-pay | Admitting: Family Medicine

## 2024-03-11 ENCOUNTER — Other Ambulatory Visit: Payer: Self-pay | Admitting: Family Medicine

## 2024-03-15 ENCOUNTER — Encounter: Payer: Self-pay | Admitting: Family Medicine

## 2024-03-18 DIAGNOSIS — J3489 Other specified disorders of nose and nasal sinuses: Secondary | ICD-10-CM | POA: Diagnosis not present

## 2024-03-18 DIAGNOSIS — Z556 Problems related to health literacy: Secondary | ICD-10-CM | POA: Diagnosis not present

## 2024-03-18 DIAGNOSIS — R040 Epistaxis: Secondary | ICD-10-CM | POA: Diagnosis not present

## 2024-03-18 DIAGNOSIS — I10 Essential (primary) hypertension: Secondary | ICD-10-CM | POA: Diagnosis not present

## 2024-03-18 DIAGNOSIS — R519 Headache, unspecified: Secondary | ICD-10-CM | POA: Diagnosis not present

## 2024-03-18 DIAGNOSIS — F17210 Nicotine dependence, cigarettes, uncomplicated: Secondary | ICD-10-CM | POA: Diagnosis not present

## 2024-03-18 DIAGNOSIS — F419 Anxiety disorder, unspecified: Secondary | ICD-10-CM | POA: Diagnosis not present

## 2024-03-18 DIAGNOSIS — Z8679 Personal history of other diseases of the circulatory system: Secondary | ICD-10-CM | POA: Diagnosis not present

## 2024-03-18 DIAGNOSIS — Z79899 Other long term (current) drug therapy: Secondary | ICD-10-CM | POA: Diagnosis not present

## 2024-03-19 ENCOUNTER — Ambulatory Visit (INDEPENDENT_AMBULATORY_CARE_PROVIDER_SITE_OTHER): Admitting: Family Medicine

## 2024-03-19 ENCOUNTER — Encounter: Payer: Self-pay | Admitting: Family Medicine

## 2024-03-19 ENCOUNTER — Other Ambulatory Visit: Payer: Self-pay | Admitting: Family Medicine

## 2024-03-19 DIAGNOSIS — R040 Epistaxis: Secondary | ICD-10-CM | POA: Diagnosis not present

## 2024-03-19 MED ORDER — ZEPBOUND 7.5 MG/0.5ML ~~LOC~~ SOAJ: 7.5000 mg | SUBCUTANEOUS | 0 refills | Status: DC

## 2024-03-19 MED ORDER — ZEPBOUND 2.5 MG/0.5ML ~~LOC~~ SOAJ: 2.5000 mg | SUBCUTANEOUS | 0 refills | Status: DC

## 2024-03-19 MED ORDER — ZEPBOUND 5 MG/0.5ML ~~LOC~~ SOAJ: 5.0000 mg | SUBCUTANEOUS | 0 refills | Status: DC

## 2024-03-19 NOTE — Progress Notes (Signed)
 Deborah Goodman - 48 y.o. female MRN [id number]  Date of birth: [birth year]  Subjective Chief Complaint  Patient presents with   Epistaxis   Weight Loss    HPI Deborah Goodman is a 48 y.o. female here today to discuss weight management.  She has struggled with fluctuations in her weight for quite some time.  She has had trouble keeping weight off.  She has co-morbidity of HTN and lumbar spondylosis.  She has tried increasing her activity level and working on dietary changes.  She has not tried prescription medication for weight loss.   Seen in ED for severe epistaxis yesterday.  Did not require packing. Improved with afrin and pressure.  This has been a recurrent issue.  She is seeing ENT later this week.   ROS:  A comprehensive ROS was completed and negative except as noted per HPI  Allergies  Allergen Reactions   Fish Allergy Nausea And Vomiting    ALL SEAFOOD   Shellfish Allergy Nausea And Vomiting    ALL SEAFOOD   Sulfa Antibiotics Other (See Comments)    Thrush reaction Sores in mouth    Past Medical History:  Diagnosis Date   Acid reflux    Avascular necrosis (HCC) 08/2018   Right hip   Complication of anesthesia    needs glide scope because trachea sit to the side not in the middle after SI surgery did not have a voice   Depression    Fatty liver 04/2017   noted on CT Chest   Fibromyalgia    GAD (generalized anxiety disorder) 02/10/2017   Hypertension 02/10/2017   Lung nodule 04/2017   Stable 4 mm nodule in the periphery of the lateral segment , right, noted on CT Chest   Palpitations 02/10/2017   a. 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    Pneumonia 01/2018   Raynaud's syndrome    Splenomegaly 04/2017   noted on CT Chest   SVT (supraventricular tachycardia) (HCC)    Systolic murmur    Tendinopathy of rotator cuff    Right   Tobacco use     Past Surgical History:  Procedure Laterality Date   SACRO-ILIAC PINNING  2016   fusion,  in [place]   SHOULDER ARTHROSCOPY WITH OPEN ROTATOR CUFF REPAIR AND DISTAL CLAVICLE ACROMINECTOMY Right 07/15/2020   Procedure: SHOULDER ARTHROSCOPY WITH ROTATOR CUFF REPAIR AND DISTAL CLAVICLE ACROMINECTOMY AND BICEPS TENDOESIS;  Surgeon: Bjorn Pippin, MD;  Location: [facility];  Service: Orthopedics;  Laterality: Right;   TONSILLECTOMY     age 56   TOTAL HIP ARTHROPLASTY Right 12/05/2018   Procedure: TOTAL HIP ARTHROPLASTY ANTERIOR APPROACH;  Surgeon: Gean Birchwood, MD;  Location: [facility];  Service: Orthopedics;  Laterality: Right;    Social History   Socioeconomic History   Marital status: Single    Spouse name: Not on file   Number of children: Not on file   Years of education: Not on file   Highest education level: Associate degree: academic program  Occupational History   Occupation: X-ray Theme park manager: [employer]  Tobacco Use   Smoking status: Every Day    Current packs/day: 0.50    Average packs/day: 0.5 packs/day for 22.0 years (11.0 ttl pk-yrs)    Types: Cigarettes, E-cigarettes   Smokeless tobacco: Never  Vaping Use   Vaping status: Former  Substance and Sexual Activity   Alcohol use: Not Currently    Alcohol/week: 4.0 - 6.0  standard drinks of alcohol    Types: 4 - 6 Standard drinks or equivalent per week    Comment: 3/week   Drug use: No   Sexual activity: Not Currently    Birth control/protection: None  Other Topics Concern   Not on file  Social History Narrative   Not on file   Social Drivers of Health   Financial Resource Strain: Low Risk  (12/14/2023)   Overall Financial Resource Strain (CARDIA)    Difficulty of Paying Living Expenses: Not hard at all  Food Insecurity: No Food Insecurity (12/14/2023)   Hunger Vital Sign    Worried About Running Out of Food in the Last Year: Never true    Ran Out of Food in the Last Year: Never true  Transportation Needs: No Transportation Needs (12/14/2023)   PRAPARE - Administrator, Civil Service  (Medical): No    Lack of Transportation (Non-Medical): No  Physical Activity: Insufficiently Active (12/14/2023)   Exercise Vital Sign    Days of Exercise per Week: 2 days    Minutes of Exercise per Session: 20 min  Stress: Stress Concern Present (12/14/2023)   [employer] of Occupational Health - Occupational Stress Questionnaire    Feeling of Stress : To some extent  Social Connections: Socially Isolated (12/14/2023)   Social Connection and Isolation Panel [NHANES]    Frequency of Communication with Friends and Family: Three times a week    Frequency of Social Gatherings with Friends and Family: Twice a week    Attends Religious Services: Never    Database administrator or Organizations: No    Attends Engineer, structural: Not on file    Marital Status: Never married    Family History  Problem Relation Age of Onset   Depression Mother    Hypertension Mother    Cancer Mother        colon   Diabetes Father    Arrhythmia Sister        palpitations   Cancer Maternal Grandfather        COLON   Diabetes Paternal Grandfather     Health Maintenance  Topic Date Due   Pneumococcal Vaccine 85-34 Years old (1 of 2 - PCV) Never done   HIV Screening  Never done   COVID-19 Vaccine (4 - 2024-25 season) 08/20/2023   INFLUENZA VACCINE  07/19/2024   Colonoscopy  08/18/2024   Cervical Cancer Screening (HPV/Pap Cotest)  10/15/2024   DTaP/Tdap/Td (2 - Td or Tdap) 07/19/2025   Hepatitis C Screening  Completed   HPV VACCINES  Aged Out     ----------------------------------------------------------------------------------------------------------------------------------------------------------------------------------------------------------------- Physical Exam BP (!) 148/92 (BP Location: Left Arm, Patient Position: Sitting, Cuff Size: Large)   Pulse 91   Ht 5\' 6"  (1.676 m)   Wt 218 lb (98.9 kg)   SpO2 98%   BMI 35.19 kg/m   Physical Exam Constitutional:       Appearance: Normal appearance.  Eyes:     General: No scleral icterus. Cardiovascular:     Rate and Rhythm: Normal rate and regular rhythm.  Pulmonary:     Effort: Pulmonary effort is normal.     Breath sounds: Normal breath sounds.  Musculoskeletal:     Cervical back: Neck supple.  Neurological:     Mental Status: She is alert.  Psychiatric:        Mood and Affect: Mood normal.        Behavior: Behavior normal.     -------------------------------------------------------------------------------------------------------------------------------------------------------------------------------------------------------------------  Assessment and Plan  Morbid obesity (HCC) I think she would benefit from addition of Zepbound to manage her weight.  Rx sent in with plan to titrate over the next few months.  Side effects reviewed.  Stressed importance of dietary and activity level change to maximize weight loss with medication.    Epistaxis Has upcoming appt with ENT.    Meds ordered this encounter  Medications   tirzepatide (ZEPBOUND) 2.5 MG/0.5ML Pen    Sig: Inject 2.5 mg into the skin once a week.    Dispense:  2 mL    Refill:  0   tirzepatide (ZEPBOUND) 5 MG/0.5ML Pen    Sig: Inject 5 mg into the skin once a week.    Dispense:  2 mL    Refill:  0   tirzepatide (ZEPBOUND) 7.5 MG/0.5ML Pen    Sig: Inject 7.5 mg into the skin once a week.    Dispense:  2 mL    Refill:  0    No follow-ups on file.    This visit occurred during the SARS-CoV-2 public health emergency.  Safety protocols were in place, including screening questions prior to the visit, additional usage of staff PPE, and extensive cleaning of exam room while observing appropriate contact time as indicated for disinfecting solutions.

## 2024-03-19 NOTE — Assessment & Plan Note (Signed)
 Has upcoming appt with ENT.

## 2024-03-19 NOTE — Assessment & Plan Note (Signed)
 I think she would benefit from addition of Zepbound to manage her weight.  Rx sent in with plan to titrate over the next few months.  Side effects reviewed.  Stressed importance of dietary and activity level change to maximize weight loss with medication.

## 2024-03-25 NOTE — Telephone Encounter (Signed)
 PA request. Thanks in advance.

## 2024-04-04 ENCOUNTER — Other Ambulatory Visit (HOSPITAL_COMMUNITY): Payer: Self-pay

## 2024-04-04 ENCOUNTER — Telehealth: Payer: Self-pay

## 2024-04-04 NOTE — Telephone Encounter (Signed)
 Pharmacy Patient Advocate Encounter   Received notification from RX Request Messages that prior authorization for Zepbound is required/requested.   Insurance verification completed.   The patient is insured through [facility] .   Per test claim: Pt is covered by [facility] which is not currently covering any weight loss related GLP-1s

## 2024-04-04 NOTE — Telephone Encounter (Signed)
 Pt is covered by [facility] which is only currently covering GLP-1 injections for diabetes, not weight loss.

## 2024-04-05 NOTE — Telephone Encounter (Signed)
 Re-routing

## 2024-04-11 NOTE — Telephone Encounter (Signed)
 Pharmacy requesting change from Zepbound   Pharmacy comment: Alternative Requested:NOT COVERED BY INSURANCE.  Checked and PA dept sent notification as below: Pt is covered by [facility] which is not currently covering any weight loss related GLP-1s

## 2024-04-17 ENCOUNTER — Other Ambulatory Visit (HOSPITAL_COMMUNITY): Payer: Self-pay

## 2024-04-17 ENCOUNTER — Telehealth: Payer: Self-pay

## 2024-04-17 NOTE — Telephone Encounter (Signed)
 Pharmacy Patient Advocate Encounter   Received notification from RX Request Messages that prior authorization for Zepbound  2.5mg /0.40ml Pen is required/requested.   Insurance verification completed.   The patient is insured through  [place]  .   Per test claim:    COMMUNICATED WITH CLINICAL TEAM IN ORIGINAL RX REQUEST

## 2024-04-17 NOTE — Telephone Encounter (Signed)
 Was able to pull other insurance with pt's [place] zipcode. Unfortunately, per test claim, "Product/Service not covered - Plan/Benefit Exclusion"

## 2024-07-26 DIAGNOSIS — D485 Neoplasm of uncertain behavior of skin: Secondary | ICD-10-CM | POA: Diagnosis not present

## 2024-07-26 DIAGNOSIS — L280 Lichen simplex chronicus: Secondary | ICD-10-CM | POA: Diagnosis not present

## 2024-07-26 DIAGNOSIS — D225 Melanocytic nevi of trunk: Secondary | ICD-10-CM | POA: Diagnosis not present

## 2024-07-26 DIAGNOSIS — L821 Other seborrheic keratosis: Secondary | ICD-10-CM | POA: Diagnosis not present

## 2024-07-26 DIAGNOSIS — L814 Other melanin hyperpigmentation: Secondary | ICD-10-CM | POA: Diagnosis not present

## 2024-07-26 DIAGNOSIS — L304 Erythema intertrigo: Secondary | ICD-10-CM | POA: Diagnosis not present

## 2024-08-18 ENCOUNTER — Other Ambulatory Visit: Payer: Self-pay | Admitting: Family Medicine

## 2024-08-20 ENCOUNTER — Encounter: Payer: Self-pay | Admitting: Sports Medicine

## 2024-08-20 NOTE — Telephone Encounter (Signed)
 Ok to refill?  Last labs: 02/22/2024

## 2024-09-02 ENCOUNTER — Other Ambulatory Visit: Payer: Self-pay | Admitting: Family Medicine

## 2024-09-02 DIAGNOSIS — I10 Essential (primary) hypertension: Secondary | ICD-10-CM

## 2024-09-02 NOTE — Telephone Encounter (Signed)
 Pls contact pt to schedule 6 month appt with Dr. Alvia. Sending 30 day med refill. Thx.

## 2024-10-20 ENCOUNTER — Other Ambulatory Visit: Payer: Self-pay | Admitting: Family Medicine

## 2024-10-20 DIAGNOSIS — I10 Essential (primary) hypertension: Secondary | ICD-10-CM

## 2024-10-20 DIAGNOSIS — F411 Generalized anxiety disorder: Secondary | ICD-10-CM

## 2024-11-11 ENCOUNTER — Other Ambulatory Visit: Payer: Self-pay | Admitting: Family Medicine

## 2025-01-13 ENCOUNTER — Other Ambulatory Visit: Payer: Self-pay | Admitting: Family Medicine

## 2025-01-13 DIAGNOSIS — I10 Essential (primary) hypertension: Secondary | ICD-10-CM

## 2025-01-14 ENCOUNTER — Ambulatory Visit: Admitting: Family Medicine

## 2025-01-20 ENCOUNTER — Ambulatory Visit: Admitting: Family Medicine

## 2025-01-21 ENCOUNTER — Ambulatory Visit: Admitting: Family Medicine

## 2025-01-24 ENCOUNTER — Encounter: Payer: Self-pay | Admitting: Family Medicine

## 2025-01-24 ENCOUNTER — Ambulatory Visit: Admitting: Family Medicine

## 2025-01-24 ENCOUNTER — Ambulatory Visit

## 2025-01-24 VITALS — BP 126/74 | HR 71 | Ht 66.0 in | Wt 213.0 lb

## 2025-01-24 DIAGNOSIS — Z124 Encounter for screening for malignant neoplasm of cervix: Secondary | ICD-10-CM

## 2025-01-24 DIAGNOSIS — I10 Essential (primary) hypertension: Secondary | ICD-10-CM

## 2025-01-24 DIAGNOSIS — G4730 Sleep apnea, unspecified: Secondary | ICD-10-CM

## 2025-01-24 DIAGNOSIS — M25552 Pain in left hip: Secondary | ICD-10-CM

## 2025-01-24 DIAGNOSIS — I471 Supraventricular tachycardia, unspecified: Secondary | ICD-10-CM

## 2025-01-24 DIAGNOSIS — E785 Hyperlipidemia, unspecified: Secondary | ICD-10-CM

## 2025-01-24 DIAGNOSIS — G8929 Other chronic pain: Secondary | ICD-10-CM

## 2025-01-24 DIAGNOSIS — Z1231 Encounter for screening mammogram for malignant neoplasm of breast: Secondary | ICD-10-CM

## 2025-01-24 DIAGNOSIS — Z1211 Encounter for screening for malignant neoplasm of colon: Secondary | ICD-10-CM

## 2025-01-24 DIAGNOSIS — M8700 Idiopathic aseptic necrosis of unspecified bone: Secondary | ICD-10-CM

## 2025-01-24 MED ORDER — TIZANIDINE HCL 4 MG PO TABS: 4.0000 mg | ORAL_TABLET | Freq: Three times a day (TID) | ORAL | 1 refills | Status: AC | PRN

## 2025-01-24 MED ORDER — CELECOXIB 200 MG PO CAPS: ORAL_CAPSULE | ORAL | 2 refills | Status: AC

## 2025-01-24 MED ORDER — METOPROLOL SUCCINATE ER 50 MG PO TB24: ORAL_TABLET | ORAL | 1 refills | Status: AC

## 2025-01-24 NOTE — Progress Notes (Unsigned)
 " REAGAN BEHLKE - 49 y.o. female MRN [id number]  Date of birth: [birth year]  Subjective No chief complaint on file.   HPI Deborah Goodman is a 49 y.o. female here today for follow up visit.   History of HTN that his managed with amlodipine  and metoprolol .  BP is ***.  History of SVT as well.  Dienes recent episodes.  Has not had chest pain, shortness of breath, palpitations, headache or vision changes.   Anxiety is ***.  She remains on sertraline  and vistaril  prn.    Allergies[1]  Past Medical History:  Diagnosis Date   Acid reflux    Avascular necrosis (HCC) 08/2018   Right hip   Complication of anesthesia    needs glide scope because trachea sit to the side not in the middle after SI surgery did not have a voice   Depression    Fatty liver 04/2017   noted on CT Chest   Fibromyalgia    GAD (generalized anxiety disorder) 02/10/2017   Hypertension 02/10/2017   Lung nodule 04/2017   Stable 4 mm nodule in the periphery of the lateral segment , right, noted on CT Chest   Palpitations 02/10/2017   a. 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    Pneumonia 01/2018   Raynaud's syndrome    Splenomegaly 04/2017   noted on CT Chest   SVT (supraventricular tachycardia)    Systolic murmur    Tendinopathy of rotator cuff    Right   Tobacco use     Past Surgical History:  Procedure Laterality Date   SACRO-ILIAC PINNING  2016   fusion, in [place]    SHOULDER ARTHROSCOPY WITH OPEN ROTATOR CUFF REPAIR AND DISTAL CLAVICLE ACROMINECTOMY Right 07/15/2020   Procedure: SHOULDER ARTHROSCOPY WITH ROTATOR CUFF REPAIR AND DISTAL CLAVICLE ACROMINECTOMY AND BICEPS TENDOESIS;  Surgeon: Cristy Bonner DASEN, MD;  Location: [facility];  Service: Orthopedics;  Laterality: Right;   TONSILLECTOMY     age 35   TOTAL HIP ARTHROPLASTY Right 12/05/2018   Procedure: TOTAL HIP ARTHROPLASTY ANTERIOR APPROACH;  Surgeon: Liam Lerner, MD;  Location: [facility];  Service: Orthopedics;   Laterality: Right;    Social History   Socioeconomic History   Marital status: Single    Spouse name: Not on file   Number of children: Not on file   Years of education: Not on file   Highest education level: Associate degree: academic program  Occupational History   Occupation: [employer] Manager: [employer]  Tobacco Use   Smoking status: Every Day    Current packs/day: 0.50    Average packs/day: 0.5 packs/day for 22.0 years (11.0 ttl pk-yrs)    Types: Cigarettes, E-cigarettes   Smokeless tobacco: Never  Vaping Use   Vaping status: Former  Substance and Sexual Activity   Alcohol use: Not Currently    Alcohol/week: 4.0 - 6.0 standard drinks of alcohol    Types: 4 - 6 Standard drinks or equivalent per week    Comment: 3/week   Drug use: No   Sexual activity: Not Currently    Birth control/protection: None  Other Topics Concern   Not on file  Social History Narrative   Not on file   Social Drivers of Health   Tobacco Use: High Risk (03/19/2024)   Patient History    Smoking Tobacco Use: Every Day    Smokeless Tobacco Use: Never    Passive Exposure: Not on file  Financial Resource Strain:  Low Risk (12/14/2023)   Overall Financial Resource Strain (CARDIA)    Difficulty of Paying Living Expenses: Not hard at all  Food Insecurity: No Food Insecurity (12/14/2023)   Hunger Vital Sign    Worried About Running Out of Food in the Last Year: Never true    Ran Out of Food in the Last Year: Never true  Transportation Needs: No Transportation Needs (12/14/2023)   PRAPARE - Administrator, Civil Service (Medical): No    Lack of Transportation (Non-Medical): No  Physical Activity: Insufficiently Active (12/14/2023)   Exercise Vital Sign    Days of Exercise per Week: 2 days    Minutes of Exercise per Session: 20 min  Stress: Stress Concern Present (12/14/2023)   [employer] of Occupational Health - Occupational Stress Questionnaire     Feeling of Stress : To some extent  Social Connections: Socially Isolated (12/14/2023)   Social Connection and Isolation Panel    Frequency of Communication with Friends and Family: Three times a week    Frequency of Social Gatherings with Friends and Family: Twice a week    Attends Religious Services: Never    Database Administrator or Organizations: No    Attends Engineer, Structural: Not on file    Marital Status: Never married  Depression (PHQ2-9): Low Risk (02/22/2024)   Depression (PHQ2-9)    PHQ-2 Score: 0  Alcohol Screen: Low Risk (12/14/2023)   Alcohol Screen    Last Alcohol Screening Score (AUDIT): 6  Housing: Unknown (12/14/2023)   Housing Stability Vital Sign    Unable to Pay for Housing in the Last Year: No    Number of Times Moved in the Last Year: Not on file    Homeless in the Last Year: No  Utilities: Not on file  Health Literacy: Not on file    Family History  Problem Relation Age of Onset   Depression Mother    Hypertension Mother    Cancer Mother        colon   Diabetes Father    Arrhythmia Sister        palpitations   Cancer Maternal Grandfather        COLON   Diabetes Paternal Grandfather     Health Maintenance  Topic Date Due   HIV Screening  Never done   Pneumococcal Vaccine (1 of 2 - PCV) Never done   Hepatitis B Vaccines 19-59 Average Risk (1 of 3 - 19+ 3-dose series) Never done   Mammogram  01/18/2020   Influenza Vaccine  07/19/2024   Colonoscopy  08/18/2024   COVID-19 Vaccine (4 - 2025-26 season) 08/19/2024   Cervical Cancer Screening (HPV/Pap Cotest)  10/15/2024   DTaP/Tdap/Td (2 - Td or Tdap) 07/19/2025   HPV VACCINES (No Doses Required) Completed   Hepatitis C Screening  Completed   Meningococcal B Vaccine  Aged Out      ----------------------------------------------------------------------------------------------------------------------------------------------------------------------------------------------------------------- Physical Exam There were no vitals taken for this visit.  Physical Exam  ------------------------------------------------------------------------------------------------------------------------------------------------------------------------------------------------------------------- Assessment and Plan  No problem-specific Assessment & Plan notes found for this encounter.   No orders of the defined types were placed in this encounter.   No follow-ups on file.          [1] Allergies Allergen Reactions   Fish Allergy Nausea And Vomiting    ALL SEAFOOD   Shellfish Allergy Nausea And Vomiting    ALL SEAFOOD   Sulfa Antibiotics Other (See Comments)    Thrush reaction Sores  in mouth  "

## 2025-02-04 ENCOUNTER — Ambulatory Visit

## 2025-07-25 ENCOUNTER — Ambulatory Visit: Admitting: Family Medicine
